# Patient Record
Sex: Female | Born: 1950 | Race: Black or African American | Hispanic: No | Marital: Single | Smoking: Never smoker
Health system: Southern US, Community
[De-identification: ages and names within clinical notes are randomized; demographics above are authoritative.]

## PROBLEM LIST (undated history)

## (undated) DIAGNOSIS — J189 Pneumonia, unspecified organism: Secondary | ICD-10-CM

## (undated) DIAGNOSIS — D649 Anemia, unspecified: Secondary | ICD-10-CM

## (undated) DIAGNOSIS — G5602 Carpal tunnel syndrome, left upper limb: Secondary | ICD-10-CM

## (undated) DIAGNOSIS — Z8719 Personal history of other diseases of the digestive system: Secondary | ICD-10-CM

## (undated) DIAGNOSIS — E119 Type 2 diabetes mellitus without complications: Secondary | ICD-10-CM

## (undated) DIAGNOSIS — N159 Renal tubulo-interstitial disease, unspecified: Secondary | ICD-10-CM

## (undated) DIAGNOSIS — Z972 Presence of dental prosthetic device (complete) (partial): Secondary | ICD-10-CM

## (undated) DIAGNOSIS — M5136 Other intervertebral disc degeneration, lumbar region: Secondary | ICD-10-CM

## (undated) DIAGNOSIS — E7800 Pure hypercholesterolemia, unspecified: Secondary | ICD-10-CM

## (undated) DIAGNOSIS — M1990 Unspecified osteoarthritis, unspecified site: Secondary | ICD-10-CM

## (undated) DIAGNOSIS — K219 Gastro-esophageal reflux disease without esophagitis: Secondary | ICD-10-CM

## (undated) DIAGNOSIS — I10 Essential (primary) hypertension: Secondary | ICD-10-CM

## (undated) DIAGNOSIS — F0390 Unspecified dementia without behavioral disturbance: Secondary | ICD-10-CM

## (undated) DIAGNOSIS — M51369 Other intervertebral disc degeneration, lumbar region without mention of lumbar back pain or lower extremity pain: Secondary | ICD-10-CM

## (undated) DIAGNOSIS — J45909 Unspecified asthma, uncomplicated: Secondary | ICD-10-CM

## (undated) HISTORY — PX: CARPAL TUNNEL RELEASE: SHX101

## (undated) HISTORY — PX: ABDOMINAL HYSTERECTOMY: SHX81

## (undated) HISTORY — PX: APPENDECTOMY: SHX54

## (undated) HISTORY — PX: EYE SURGERY: SHX253

## (undated) HISTORY — PX: COLONOSCOPY: SHX174

## (undated) HISTORY — PX: REVERSE TOTAL SHOULDER ARTHROPLASTY: SHX2344

## (undated) HISTORY — PX: ESOPHAGOGASTRODUODENOSCOPY: SHX1529

## (undated) HISTORY — PX: BACK SURGERY: SHX140

## (undated) HISTORY — PX: LAMINECTOMY: SHX219

## (undated) HISTORY — PX: WRIST FUSION: SHX839

---

## 2006-09-23 ENCOUNTER — Ambulatory Visit: Payer: Self-pay | Admitting: Otolaryngology

## 2006-11-22 ENCOUNTER — Ambulatory Visit: Payer: Self-pay | Admitting: Unknown Physician Specialty

## 2010-08-06 ENCOUNTER — Emergency Department: Payer: Self-pay | Admitting: Unknown Physician Specialty

## 2011-08-03 ENCOUNTER — Ambulatory Visit: Payer: Self-pay

## 2012-04-28 ENCOUNTER — Ambulatory Visit: Payer: Self-pay | Admitting: Internal Medicine

## 2013-05-25 ENCOUNTER — Ambulatory Visit: Payer: Self-pay | Admitting: Internal Medicine

## 2014-05-28 ENCOUNTER — Ambulatory Visit: Payer: Self-pay | Admitting: Internal Medicine

## 2015-06-13 ENCOUNTER — Other Ambulatory Visit: Payer: Self-pay | Admitting: Internal Medicine

## 2015-06-13 DIAGNOSIS — Z1231 Encounter for screening mammogram for malignant neoplasm of breast: Secondary | ICD-10-CM

## 2015-06-20 ENCOUNTER — Ambulatory Visit
Admission: RE | Admit: 2015-06-20 | Discharge: 2015-06-20 | Disposition: A | Discharge status: Home / Self Care | Payer: Medicare Other | Source: Ambulatory Visit | Attending: Internal Medicine | Admitting: Internal Medicine

## 2015-06-20 ENCOUNTER — Other Ambulatory Visit: Payer: Self-pay | Admitting: Internal Medicine

## 2015-06-20 DIAGNOSIS — Z1231 Encounter for screening mammogram for malignant neoplasm of breast: Secondary | ICD-10-CM

## 2016-06-05 ENCOUNTER — Other Ambulatory Visit: Payer: Self-pay | Admitting: Internal Medicine

## 2016-06-05 DIAGNOSIS — Z1231 Encounter for screening mammogram for malignant neoplasm of breast: Secondary | ICD-10-CM

## 2016-07-02 ENCOUNTER — Ambulatory Visit
Admission: RE | Admit: 2016-07-02 | Discharge: 2016-07-02 | Disposition: A | Discharge status: Home / Self Care | Payer: Medicare Other | Source: Ambulatory Visit | Attending: Internal Medicine | Admitting: Internal Medicine

## 2016-07-02 DIAGNOSIS — Z1231 Encounter for screening mammogram for malignant neoplasm of breast: Secondary | ICD-10-CM | POA: Diagnosis present

## 2016-07-05 ENCOUNTER — Other Ambulatory Visit: Payer: Self-pay | Admitting: Internal Medicine

## 2016-07-05 DIAGNOSIS — R928 Other abnormal and inconclusive findings on diagnostic imaging of breast: Secondary | ICD-10-CM

## 2016-07-10 ENCOUNTER — Other Ambulatory Visit: Payer: Self-pay | Admitting: General Surgery

## 2016-07-19 ENCOUNTER — Ambulatory Visit
Admission: RE | Admit: 2016-07-19 | Discharge: 2016-07-19 | Disposition: A | Discharge status: Home / Self Care | Payer: Medicare Other | Source: Ambulatory Visit | Attending: Internal Medicine | Admitting: Internal Medicine

## 2016-07-19 DIAGNOSIS — R928 Other abnormal and inconclusive findings on diagnostic imaging of breast: Secondary | ICD-10-CM | POA: Diagnosis present

## 2016-11-15 ENCOUNTER — Ambulatory Visit
Admission: RE | Admit: 2016-11-15 | Discharge: 2016-11-15 | Disposition: A | Discharge status: Home / Self Care | Payer: Medicare Other | Source: Ambulatory Visit | Attending: Internal Medicine | Admitting: Internal Medicine

## 2016-11-15 ENCOUNTER — Other Ambulatory Visit: Payer: Self-pay | Admitting: Internal Medicine

## 2016-11-15 DIAGNOSIS — M545 Low back pain: Secondary | ICD-10-CM | POA: Insufficient documentation

## 2016-11-15 DIAGNOSIS — M4186 Other forms of scoliosis, lumbar region: Secondary | ICD-10-CM | POA: Diagnosis not present

## 2016-11-15 DIAGNOSIS — M5136 Other intervertebral disc degeneration, lumbar region: Secondary | ICD-10-CM | POA: Diagnosis present

## 2016-11-15 DIAGNOSIS — Z8739 Personal history of other diseases of the musculoskeletal system and connective tissue: Secondary | ICD-10-CM

## 2016-11-15 DIAGNOSIS — M4316 Spondylolisthesis, lumbar region: Secondary | ICD-10-CM | POA: Insufficient documentation

## 2016-11-15 DIAGNOSIS — Z9889 Other specified postprocedural states: Secondary | ICD-10-CM | POA: Insufficient documentation

## 2017-05-22 ENCOUNTER — Encounter: Admission: RE | Payer: Self-pay | Source: Ambulatory Visit

## 2017-05-22 ENCOUNTER — Ambulatory Visit
Admission: RE | Admit: 2017-05-22 | Payer: Medicare Other | Source: Ambulatory Visit | Admitting: Unknown Physician Specialty

## 2017-05-22 SURGERY — ESOPHAGOGASTRODUODENOSCOPY (EGD) WITH PROPOFOL
Anesthesia: General

## 2017-06-28 ENCOUNTER — Other Ambulatory Visit: Payer: Self-pay | Admitting: Internal Medicine

## 2017-06-28 DIAGNOSIS — Z1231 Encounter for screening mammogram for malignant neoplasm of breast: Secondary | ICD-10-CM

## 2017-07-19 ENCOUNTER — Ambulatory Visit
Admission: RE | Admit: 2017-07-19 | Discharge: 2017-07-19 | Disposition: A | Discharge status: Home / Self Care | Payer: Medicare Other | Source: Ambulatory Visit | Attending: Internal Medicine | Admitting: Internal Medicine

## 2017-07-19 DIAGNOSIS — Z1231 Encounter for screening mammogram for malignant neoplasm of breast: Secondary | ICD-10-CM

## 2017-08-16 ENCOUNTER — Encounter: Payer: Self-pay | Admitting: *Deleted

## 2017-08-19 ENCOUNTER — Ambulatory Visit: Payer: Medicare Other | Admitting: Anesthesiology

## 2017-08-19 ENCOUNTER — Ambulatory Visit
Admission: RE | Admit: 2017-08-19 | Discharge: 2017-08-19 | Disposition: A | Discharge status: Home / Self Care | Payer: Medicare Other | Source: Ambulatory Visit | Attending: Unknown Physician Specialty | Admitting: Unknown Physician Specialty

## 2017-08-19 ENCOUNTER — Encounter
Admission: RE | Disposition: A | Discharge status: Home / Self Care | Payer: Self-pay | Source: Ambulatory Visit | Attending: Unknown Physician Specialty

## 2017-08-19 DIAGNOSIS — K648 Other hemorrhoids: Secondary | ICD-10-CM | POA: Diagnosis not present

## 2017-08-19 DIAGNOSIS — K5730 Diverticulosis of large intestine without perforation or abscess without bleeding: Secondary | ICD-10-CM | POA: Insufficient documentation

## 2017-08-19 DIAGNOSIS — Z7982 Long term (current) use of aspirin: Secondary | ICD-10-CM | POA: Diagnosis not present

## 2017-08-19 DIAGNOSIS — Z1211 Encounter for screening for malignant neoplasm of colon: Secondary | ICD-10-CM | POA: Diagnosis present

## 2017-08-19 DIAGNOSIS — Z7984 Long term (current) use of oral hypoglycemic drugs: Secondary | ICD-10-CM | POA: Insufficient documentation

## 2017-08-19 DIAGNOSIS — J45909 Unspecified asthma, uncomplicated: Secondary | ICD-10-CM | POA: Insufficient documentation

## 2017-08-19 DIAGNOSIS — Z79899 Other long term (current) drug therapy: Secondary | ICD-10-CM | POA: Diagnosis not present

## 2017-08-19 DIAGNOSIS — I10 Essential (primary) hypertension: Secondary | ICD-10-CM | POA: Diagnosis not present

## 2017-08-19 DIAGNOSIS — K222 Esophageal obstruction: Secondary | ICD-10-CM | POA: Insufficient documentation

## 2017-08-19 HISTORY — DX: Unspecified asthma, uncomplicated: J45.909

## 2017-08-19 HISTORY — PX: COLONOSCOPY WITH PROPOFOL: SHX5780

## 2017-08-19 HISTORY — PX: ESOPHAGOGASTRODUODENOSCOPY (EGD) WITH PROPOFOL: SHX5813

## 2017-08-19 HISTORY — DX: Unspecified osteoarthritis, unspecified site: M19.90

## 2017-08-19 HISTORY — DX: Essential (primary) hypertension: I10

## 2017-08-19 LAB — GLUCOSE, CAPILLARY: GLUCOSE-CAPILLARY: 97 mg/dL (ref 65–99)

## 2017-08-19 SURGERY — COLONOSCOPY WITH PROPOFOL
Anesthesia: General

## 2017-08-19 MED ORDER — SODIUM CHLORIDE 0.9 % IV SOLN: INTRAVENOUS | Status: DC

## 2017-08-19 MED ORDER — FENTANYL CITRATE (PF) 100 MCG/2ML IJ SOLN
INTRAMUSCULAR | Status: DC | PRN
  Administered 2017-08-19 (×2): 50 ug via INTRAVENOUS

## 2017-08-19 MED ORDER — SODIUM CHLORIDE 0.9 % IV SOLN
INTRAVENOUS | Status: DC
  Administered 2017-08-19: 13:00:00 via INTRAVENOUS

## 2017-08-19 MED ORDER — GLYCOPYRROLATE 0.2 MG/ML IJ SOLN
INTRAMUSCULAR | Status: AC
  Filled 2017-08-19: qty 1

## 2017-08-19 MED ORDER — PROPOFOL 10 MG/ML IV BOLUS
INTRAVENOUS | Status: DC | PRN
  Administered 2017-08-19: 20 mg via INTRAVENOUS
  Administered 2017-08-19: 30 mg via INTRAVENOUS

## 2017-08-19 MED ORDER — PROPOFOL 500 MG/50ML IV EMUL
INTRAVENOUS | Status: DC | PRN
  Administered 2017-08-19: 50 ug/kg/min via INTRAVENOUS

## 2017-08-19 MED ORDER — MIDAZOLAM HCL 2 MG/2ML IJ SOLN
INTRAMUSCULAR | Status: AC
  Filled 2017-08-19: qty 2

## 2017-08-19 MED ORDER — LIDOCAINE HCL (PF) 2 % IJ SOLN
INTRAMUSCULAR | Status: DC | PRN
  Administered 2017-08-19: 60 mg

## 2017-08-19 MED ORDER — FENTANYL CITRATE (PF) 100 MCG/2ML IJ SOLN
INTRAMUSCULAR | Status: AC
  Filled 2017-08-19: qty 2

## 2017-08-19 MED ORDER — LIDOCAINE HCL (PF) 2 % IJ SOLN
INTRAMUSCULAR | Status: AC
  Filled 2017-08-19: qty 10

## 2017-08-19 MED ORDER — MIDAZOLAM HCL 5 MG/5ML IJ SOLN
INTRAMUSCULAR | Status: DC | PRN
  Administered 2017-08-19: 2 mg via INTRAVENOUS

## 2017-08-19 MED ORDER — PROPOFOL 500 MG/50ML IV EMUL
INTRAVENOUS | Status: AC
  Filled 2017-08-19: qty 50

## 2017-08-19 NOTE — Op Note (Signed)
[facility] Gastroenterology Patient Name: Kristie Hoover Procedure Date: 08/19/2017 8:10 AM MRN: [id number] Account #: [id number] Date of Birth: 02-17-1951 Admit Type: Outpatient Age: 67 Room: [facility] ENDO ROOM 3 Gender: Female Note Status: Finalized Procedure:            Upper GI endoscopy Indications:          Dysphagia, Heartburn Providers:            Scot Jun, MD Referring MD:         Jillene Bucks. Arlana Pouch, MD (Referring MD) Medicines:            Propofol per Anesthesia Complications:        No immediate complications. Procedure:            Pre-Anesthesia Assessment:                       - After reviewing the risks and benefits, the patient                        was deemed in satisfactory condition to undergo the                        procedure.                       After obtaining informed consent, the endoscope was                        passed under direct vision. Throughout the procedure,                        the patient's blood pressure, pulse, and oxygen                        saturations were monitored continuously. The Endoscope                        was introduced through the mouth, and advanced to the                        second part of duodenum. The upper GI endoscopy was                        accomplished without difficulty. The patient tolerated                        the procedure well. Findings:      A mild Schatzki ring was found at the gastroesophageal junction. A       guidewire was placed and the scope was withdrawn. Dilation was performed       with a Savary dilator with mild resistance at 17 mm.      The stomach was normal.      The examined duodenum was normal. Impression:           - Mild Schatzki ring. Dilated.                       - Normal stomach.                       - Normal examined duodenum.                       -  No specimens collected. Recommendation:       - soft food for 3 days, eat slowly, chew well, take                   small bites Scot Junobert T Adraine Biffle, MD 08/19/2017 1:43:21 PM This report has been signed electronically. Number of Addenda: 0 Note Initiated On: 08/19/2017 8:10 AM      [facility]

## 2017-08-19 NOTE — Anesthesia Post-op Follow-up Note (Signed)
Anesthesia QCDR form completed.        

## 2017-08-19 NOTE — Op Note (Signed)
[facility] Gastroenterology Patient Name: Kristie SallesBrenda Hoover Procedure Date: 08/19/2017 8:10 AM MRN: [id number] Account #: [id number] Date of Birth: April 29, 1950 Admit Type: Outpatient Age: [age] Room: [facility] ENDO ROOM 3 Gender: Female Note Status: Finalized Procedure:            Colonoscopy Indications:          Screening for colorectal malignant neoplasm Providers:            Scot Junobert T. Breckon Reeves, MD Referring MD:         Jillene Bucksenny C. Arlana Pouchate, MD (Referring MD) Medicines:            Propofol per Anesthesia Complications:        No immediate complications. Procedure:            Pre-Anesthesia Assessment:                       - After reviewing the risks and benefits, the patient                        was deemed in satisfactory condition to undergo the                        procedure.                       After obtaining informed consent, the colonoscope was                        passed under direct vision. Throughout the procedure,                        the patient's blood pressure, pulse, and oxygen                        saturations were monitored continuously. The                        Colonoscope was introduced through the anus and                        advanced to the the cecum, identified by appendiceal                        orifice and ileocecal valve. The colonoscopy was                        performed without difficulty. The patient tolerated the                        procedure well. The quality of the bowel preparation                        was good. Findings:      Multiple medium-mouthed diverticula were found in the sigmoid colon,       descending colon, transverse colon and ascending colon.      Internal hemorrhoids were found during endoscopy. The hemorrhoids were       small and Grade I (internal hemorrhoids that do not prolapse).      The exam was otherwise without abnormality. Impression:           - Diverticulosis  in the sigmoid colon, in the              descending colon, in the transverse colon and in the                        ascending colon.                       - Internal hemorrhoids.                       - The examination was otherwise normal.                       - No specimens collected. Recommendation:       - Repeat colonoscopy in 10 years for screening purposes. Scot Jun, MD 08/19/2017 2:02:21 PM This report has been signed electronically. Number of Addenda: 0 Note Initiated On: 08/19/2017 8:10 AM Scope Withdrawal Time: 0 hours 5 minutes 38 seconds  Total Procedure Duration: 0 hours 12 minutes 41 seconds       [facility]

## 2017-08-19 NOTE — Anesthesia Preprocedure Evaluation (Signed)
Anesthesia Evaluation  Patient identified by MRN, date of birth, ID band Patient awake    Reviewed: Allergy & Precautions, H&P , NPO status , Patient's Chart, lab work & pertinent test results, reviewed documented beta blocker date and time   Airway Mallampati: II   Neck ROM: full    Dental  (+) Teeth Intact   Pulmonary neg pulmonary ROS, asthma ,    Pulmonary exam normal        Cardiovascular Exercise Tolerance: Good hypertension, On Medications negative cardio ROS Normal cardiovascular exam Rhythm:regular Rate:Normal     Neuro/Psych negative neurological ROS  negative psych ROS   GI/Hepatic negative GI ROS, Neg liver ROS,   Endo/Other  negative endocrine ROS  Renal/GU negative Renal ROS  negative genitourinary   Musculoskeletal   Abdominal   Peds  Hematology negative hematology ROS (+)   Anesthesia Other Findings Past Medical History: No date: Arthritis No date: Asthma No date: Hypertension Past Surgical History: No date: ABDOMINAL HYSTERECTOMY No date: APPENDECTOMY No date: COLONOSCOPY No date: ESOPHAGOGASTRODUODENOSCOPY No date: LAMINECTOMY No date: WRIST FUSION BMI    Body Mass Index:  29.95 kg/m     Reproductive/Obstetrics negative OB ROS                             Anesthesia Physical Anesthesia Plan  ASA: III  Anesthesia Plan: General   Post-op Pain Management:    Induction:   PONV Risk Score and Plan:   Airway Management Planned:   Additional Equipment:   Intra-op Plan:   Post-operative Plan:   Informed Consent: I have reviewed the patients History and Physical, chart, labs and discussed the procedure including the risks, benefits and alternatives for the proposed anesthesia with the patient or authorized representative who has indicated his/her understanding and acceptance.   Dental Advisory Given  Plan Discussed with: CRNA  Anesthesia Plan  Comments:         Anesthesia Quick Evaluation

## 2017-08-19 NOTE — H&P (Signed)
Primary Care Physician:  Jaclyn Shaggy, MD Primary Gastroenterologist:  Dr. Mechele Collin  Pre-Procedure History & Physical: HPI:  Kristie Hoover is a 67 y.o. female is here for an colonoscopy.  For colon cancer screening.  EGD for dysphagia.   Past Medical History:  Diagnosis Date  . Arthritis   . Asthma   . Hypertension     Past Surgical History:  Procedure Laterality Date  . ABDOMINAL HYSTERECTOMY    . APPENDECTOMY    . COLONOSCOPY    . ESOPHAGOGASTRODUODENOSCOPY    . LAMINECTOMY    . WRIST FUSION      Prior to Admission medications   Medication Sig Start Date End Date Taking? Authorizing Provider  aspirin EC 81 MG tablet Take 81 mg by mouth daily.   Yes [provider]  cyclobenzaprine (FLEXERIL) 10 MG tablet Take 10 mg by mouth 3 (three) times daily as needed for muscle spasms.   Yes [provider]  etodolac (LODINE) 400 MG tablet Take 400 mg by mouth 2 (two) times daily.   Yes [provider]  Fluticasone-Salmeterol (ADVAIR) 100-50 MCG/DOSE AEPB Inhale 1 puff into the lungs 2 (two) times daily.   Yes [provider]  glipizide-metformin (METAGLIP) 2.5-250 MG tablet Take 1 tablet by mouth 2 (two) times daily before a meal.   Yes [provider]  montelukast (SINGULAIR) 10 MG tablet Take 10 mg by mouth at bedtime.   Yes [provider]  pantoprazole (PROTONIX) 40 MG tablet Take 40 mg by mouth daily.   Yes [provider]  potassium chloride SA (K-DUR,KLOR-CON) 20 MEQ tablet Take 20 mEq by mouth 2 (two) times daily.   Yes [provider]  pravastatin (PRAVACHOL) 40 MG tablet Take 40 mg by mouth daily.   Yes [provider]    Allergies as of 06/04/2017 - never reviewed  Allergen Reaction Noted  . Erythromycin Rash 07/02/2016    Family History  Problem Relation Age of Onset  . Breast cancer Other     Social History   Socioeconomic History  . Marital status: Single    Spouse name: Not on  file  . Number of children: Not on file  . Years of education: Not on file  . Highest education level: Not on file  Occupational History  . Not on file  Social Needs  . Financial resource strain: Not on file  . Food insecurity:    Worry: Not on file    Inability: Not on file  . Transportation needs:    Medical: Not on file    Non-medical: Not on file  Tobacco Use  . Smoking status: Not on file  Substance and Sexual Activity  . Alcohol use: Not on file  . Drug use: Not on file  . Sexual activity: Not on file  Lifestyle  . Physical activity:    Days per week: Not on file    Minutes per session: Not on file  . Stress: Not on file  Relationships  . Social connections:    Talks on phone: Not on file    Gets together: Not on file    Attends religious service: Not on file    Active member of club or organization: Not on file    Attends meetings of clubs or organizations: Not on file    Relationship status: Not on file  . Intimate partner violence:    Fear of current or ex partner: Not on file  Emotionally abused: Not on file    Physically abused: Not on file    Forced sexual activity: Not on file  Other Topics Concern  . Not on file  Social History Narrative  . Not on file    Review of Systems: See HPI, otherwise negative ROS  Physical Exam: BP (!) 172/78   Temp (!) 97.5 F (36.4 C) (Tympanic)   Ht 5\' 8"  (1.727 m)   Wt 89.4 kg (197 lb)   SpO2 98%   BMI 29.95 kg/m  General:   Alert,  pleasant and cooperative in NAD Head:  Normocephalic and atraumatic. Neck:  Supple; no masses or thyromegaly. Lungs:  Clear throughout to auscultation.    Heart:  Regular rate and rhythm. Abdomen:  Soft, nontender and nondistended. Normal bowel sounds, without guarding, and without rebound.   Neurologic:  Alert and  oriented x4;  grossly normal neurologically.  Impression/Plan: Kristie Hoover is here for an colonoscopy to be performed for colon cancer screening., EGD for  dysphagia.  Risks, benefits, limitations, and alternatives regarding  endoscopy and colonoscopy have been reviewed with the patient.  Questions have been answered.  All parties agreeable.   Lynnae PrudeELLIOTT, Charlyne Robertshaw, MD  08/19/2017, 1:18 PM

## 2017-08-19 NOTE — Transfer of Care (Signed)
Immediate Anesthesia Transfer of Care Note  Patient: Kristie Hoover  Procedure(s) Performed: COLONOSCOPY WITH PROPOFOL (N/A ) ESOPHAGOGASTRODUODENOSCOPY (EGD) WITH PROPOFOL (N/A )  Patient Location: PACU  Anesthesia Type:General  Level of Consciousness: sedated  Airway & Oxygen Therapy: Patient Spontanous Breathing and Patient connected to nasal cannula oxygen  Post-op Assessment: Report given to RN and Post -op Vital signs reviewed and stable  Post vital signs: Reviewed and stable  Last Vitals:  Vitals Value Taken Time  BP    Temp    Pulse 66 08/19/2017  2:02 PM  Resp 16 08/19/2017  2:02 PM  SpO2 96 % 08/19/2017  2:02 PM  Vitals shown include unvalidated device data.  Last Pain:  Vitals:   08/19/17 1251  TempSrc: Tympanic  PainSc: 0-No pain         Complications: No apparent anesthesia complications

## 2017-08-22 ENCOUNTER — Encounter: Payer: Self-pay | Admitting: Unknown Physician Specialty

## 2017-08-26 NOTE — Anesthesia Postprocedure Evaluation (Signed)
Anesthesia Post Note  Patient: Kristie Hoover  Procedure(s) Performed: COLONOSCOPY WITH PROPOFOL (N/A ) ESOPHAGOGASTRODUODENOSCOPY (EGD) WITH PROPOFOL (N/A )  Patient location during evaluation: PACU Anesthesia Type: General Level of consciousness: awake and alert Pain management: pain level controlled Vital Signs Assessment: post-procedure vital signs reviewed and stable Respiratory status: spontaneous breathing, nonlabored ventilation, respiratory function stable and patient connected to nasal cannula oxygen Cardiovascular status: blood pressure returned to baseline and stable Postop Assessment: no apparent nausea or vomiting Anesthetic complications: no     Last Vitals:  Vitals:   08/19/17 1422 08/19/17 1432  BP: 136/81 (!) 164/87  Pulse: 63 71  Resp: (!) 27 (!) 23  Temp:    SpO2: 100% 98%    Last Pain:  Vitals:   08/20/17 0717  TempSrc:   PainSc: 0-No pain                 Yevette EdwardsJames G Adams

## 2018-05-19 IMAGING — MG MM DIGITAL DIAGNOSTIC UNILAT*L* W/ TOMO W/ CAD
6 series · 6 of 14 positions shown · non-contrast
Comparison: Previous exam(s).

CLINICAL DATA: Screening recall for a left breast distortion. Also,
the technologist noted some dimpling around the left nipple at the
time of the screening mammogram. The patient is asymptomatic.

EXAM:
2D DIGITAL DIAGNOSTIC UNILATERAL LEFT MAMMOGRAM WITH CAD AND ADJUNCT
TOMO
LEFT BREAST ULTRASOUND

[L MLO synth-2D]
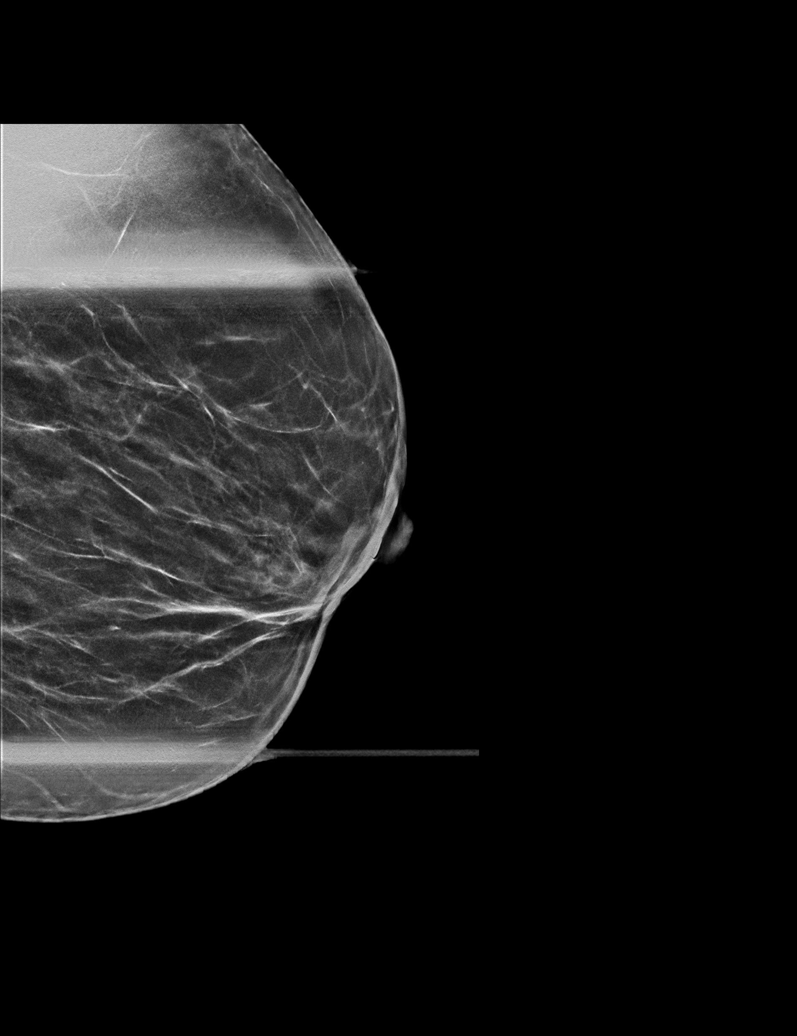

[L MLO]
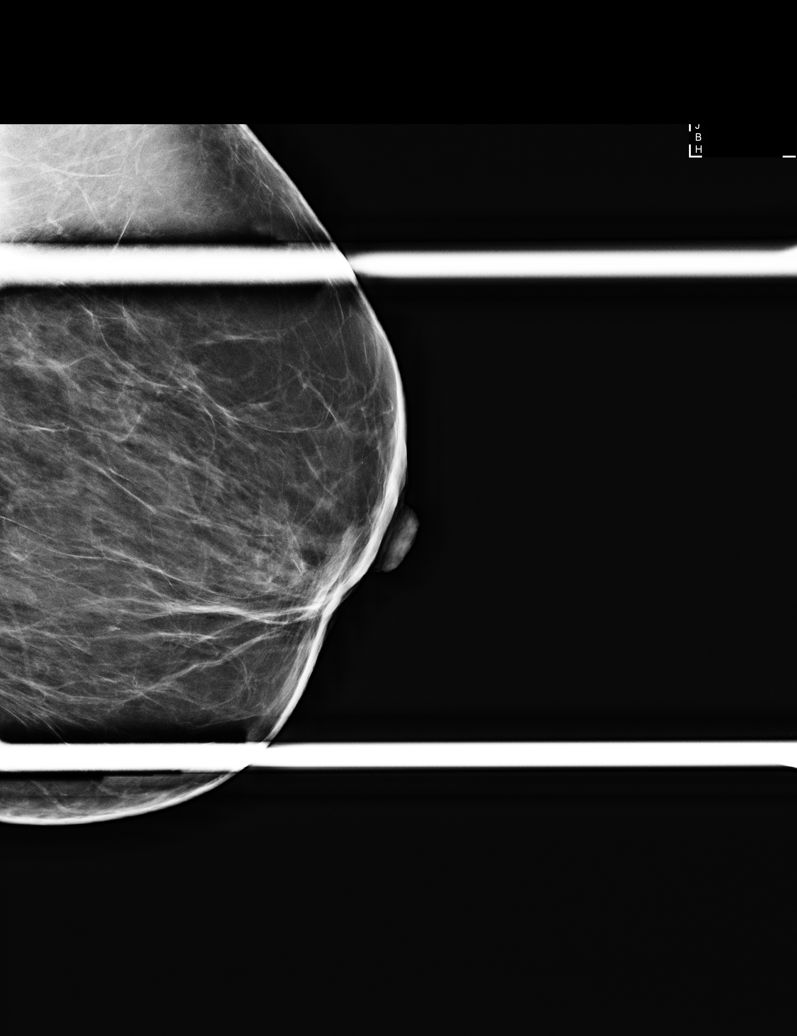

[L ML synth-2D]
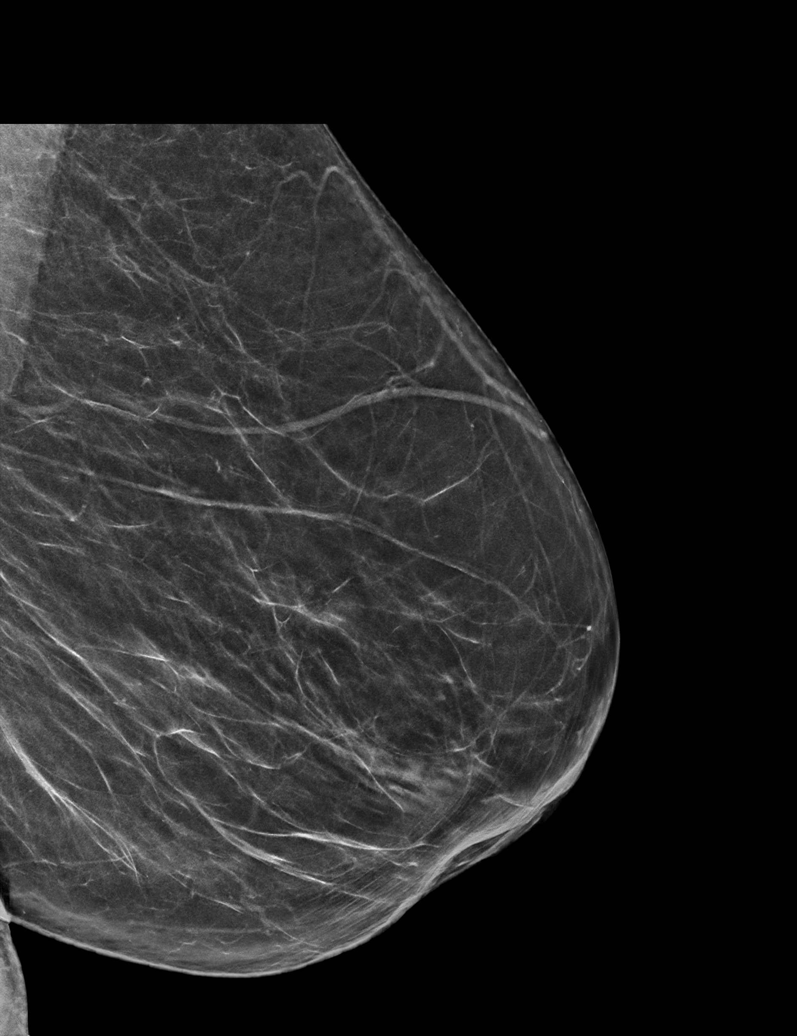

[L ML]
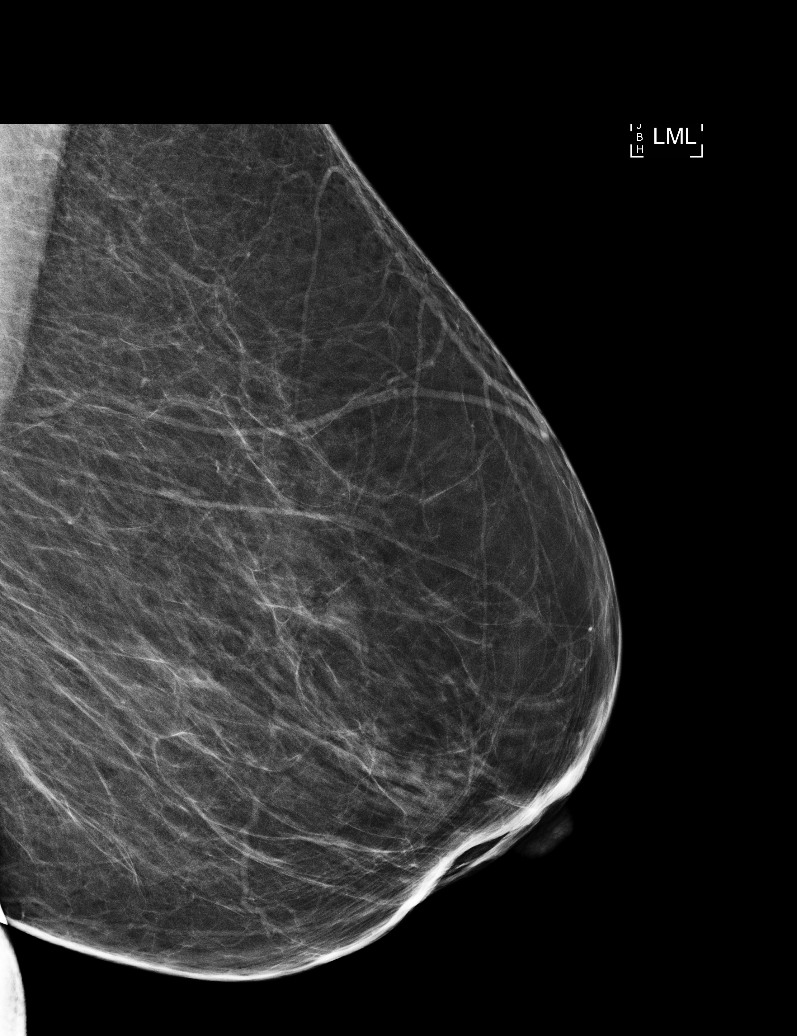

[L MLO tomo · tomo slice 23/44.0]
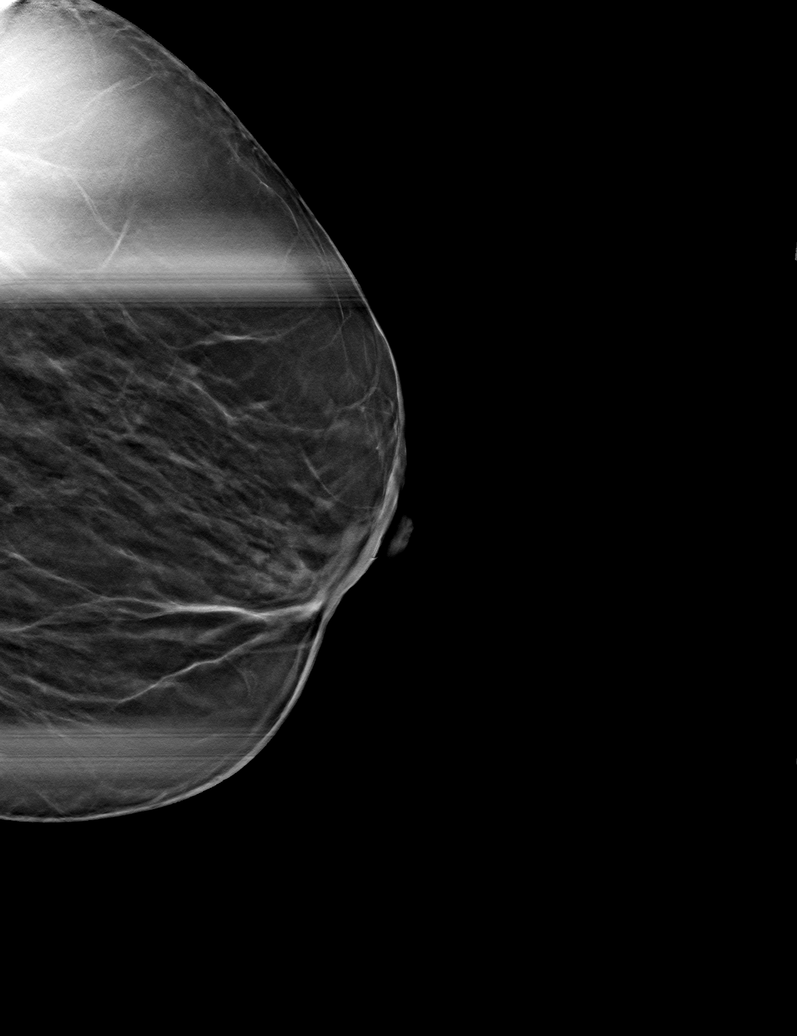

[L ML tomo · tomo slice 31/62.0]
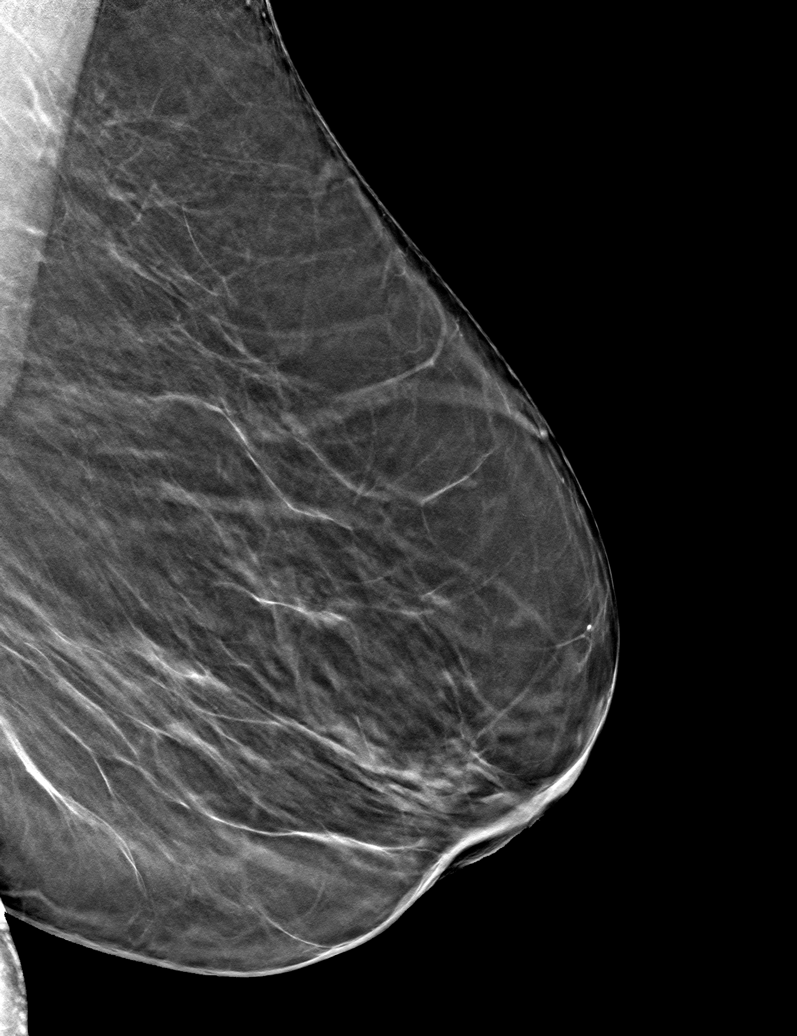

[6 of 14 positions shown; findings below may reference images not displayed]

ACR Breast Density Category b: There are scattered areas of
fibroglandular density.
FINDINGS: No suspicious calcifications, masses or areas of distortion are seen
in the bilateral breasts. There is a mild indentation inferior to
the left nipple seen on the true lateral and MLO views, however this
does not appear significantly different from several prior exams.
This is also similar to the contralateral breast. No distortion is
identified.

Mammographic images were processed with CAD.

Physical exam of the subareolar left breast demonstrates a slight
indentation of the breast inferior to the left nipple within the
areola, however only when compressed. No palpable masses are
identified on exam.

Ultrasound of the subareolar left breast demonstrates normal
fibroglandular tissue. No masses or suspicious areas of shadowing
are identified.
IMPRESSION: 1. No mammographic or targeted sonographic abnormalities in the
subareolar left breast.

2. No mammographic or targeted sonographic evidence of left breast
malignancy.

RECOMMENDATION:
1. Clinical correlation is recommended for the dimpling of the left
breast. If there is any concern for a radiographically or
sonographically occult abnormality contributing to this indentation,
breast MRI is recommended.

2.  Screening mammogram in one year.(Code:[id number]-[id number])

I have discussed the findings and recommendations with the patient.
Results were also provided in writing at the conclusion of the
visit. If applicable, a reminder letter will be sent to the patient
regarding the next appointment.

BI-RADS CATEGORY  1: Negative.

## 2018-12-08 ENCOUNTER — Other Ambulatory Visit: Payer: Self-pay | Admitting: Internal Medicine

## 2018-12-08 DIAGNOSIS — Z1231 Encounter for screening mammogram for malignant neoplasm of breast: Secondary | ICD-10-CM

## 2019-01-27 ENCOUNTER — Ambulatory Visit
Admission: RE | Admit: 2019-01-27 | Discharge: 2019-01-27 | Disposition: A | Discharge status: Home / Self Care | Payer: Medicare Other | Source: Ambulatory Visit | Attending: Internal Medicine | Admitting: Internal Medicine

## 2019-01-27 DIAGNOSIS — Z1231 Encounter for screening mammogram for malignant neoplasm of breast: Secondary | ICD-10-CM

## 2020-01-20 ENCOUNTER — Other Ambulatory Visit: Payer: Self-pay | Admitting: Internal Medicine

## 2020-01-20 DIAGNOSIS — Z1231 Encounter for screening mammogram for malignant neoplasm of breast: Secondary | ICD-10-CM

## 2020-02-15 ENCOUNTER — Ambulatory Visit (INDEPENDENT_AMBULATORY_CARE_PROVIDER_SITE_OTHER): Payer: Medicare Other | Admitting: Obstetrics & Gynecology

## 2020-02-15 ENCOUNTER — Encounter: Payer: Self-pay | Admitting: Obstetrics & Gynecology

## 2020-02-15 ENCOUNTER — Other Ambulatory Visit: Payer: Self-pay

## 2020-02-15 VITALS — BP 120/80 | Ht 68.0 in | Wt 191.0 lb

## 2020-02-15 DIAGNOSIS — N8111 Cystocele, midline: Secondary | ICD-10-CM | POA: Insufficient documentation

## 2020-02-15 NOTE — Patient Instructions (Addendum)
Thank you for choosing [facility]. As part of our ongoing efforts to improve patient experience, we would appreciate your feedback. Please fill out the short survey that you will receive by mail or MyChart. Your opinion is important to [place]! -Dr Tiburcio Pea   Anterior and Posterior Colporrhaphy  Anterior or posterior colporrhaphy is surgery to fix a prolapse of organs in the genital tract. Prolapse is a condition in which an organ bulges or drops down from its normal position. Organs that commonly prolapse include the rectum, bladder, vagina, and uterus. Prolapse can affect a single organ or several organs at the same time.  You may need this surgery if you have a severe prolapse that causes symptoms that interfere with your daily life and cannot be corrected with other treatments. Prolapse often worsens when women stop having their monthly periods (menopause) because estrogen loss weakens the muscles and tissues in the genital tract. Prolapse can also happen when the organs are damaged or weakened. This commonly happens after childbirth and as a result of aging. The type of colporrhaphy done depends on the type of genital prolapse. Types of genital prolapse include the following:  Cystocele. This is a prolapse of the bladder and the upper part of the front (anterior) wall of the vagina.  Rectocele. This is a prolapse of the rectum and the lower part of the back (posterior) wall of the vagina.  Enterocele. This is a prolapse of the small intestine. It appears as a bulge under the neck of the uterus at the top of the back wall of the vagina.  Procidentia. This is a complete prolapse of the uterus and the cervix. The prolapse can be seen and felt coming out of the vagina. Tell a health care provider about:  Any allergies you have.  All medicines you are taking, including vitamins, herbs, eye drops, creams, and over-the-counter medicines.  Any problems you or family members have had with anesthetic  medicines.  Any blood disorders you have.  Any surgeries you have had.  Any medical conditions you have.  Smoking history or history of alcohol use.  Whether you are pregnant or may be pregnant. What are the risks? Generally, this is a safe procedure. However, problems may occur, including:  Infection.  Bleeding.  Allergic reactions to medicines.  Damage to other structures or organs.  Problems urinating.  Incontinence.  Nerve damage.  Painful sex.  Constipation.  A blood clot that travels to your lungs. What happens before the procedure? Staying hydrated Follow instructions from your health care provider about hydration, which may include:  Up to 2 hours before the procedure - you may continue to drink clear liquids, such as water, clear fruit juice, black coffee, and plain tea. Eating and drinking restrictions Follow instructions from your health care provider about eating and drinking, which may include:  8 hours before the procedure - stop eating heavy meals or foods such as meat, fried foods, or fatty foods.  6 hours before the procedure - stop eating light meals or foods, such as toast or cereal.  6 hours before the procedure - stop drinking milk or drinks that contain milk.  2 hours before the procedure - stop drinking clear liquids. General instructions  Ask your health care provider about: ? Changing or stopping your regular medicines. This is especially important if you are taking diabetes medicines or blood thinners. ? Taking medicines such as aspirin and ibuprofen. These medicines can thin your blood. Do not take these medicines  before your procedure if your health care provider instructs you not to.  You may be given antibiotics to help prevent infection.  You may be instructed to use estrogen cream in your vagina to help prevent complications and promote healing.  Do not use any products that contain nicotine or tobacco, such as cigarettes and  e-cigarettes, for at least 2 weeks before the procedure. If you need help quitting, ask your health care provider.  Plan to have someone take you home from the hospital. Also, arrange for someone to help you with activities during recovery. What happens during the procedure?  To lower your risk of infection: ? Your health care team will wash or sanitize their hands. ? Your skin will be washed with soap. ? Hair may be removed from the surgical area.  An IV will be inserted into one of your veins.  You will be given one or more of the following: ? A medicine to help you relax (sedative). ? A medicine to make you fall asleep (general anesthetic).  You may be given antibiotics through your IV.  You will lie down on the operating table with your feet in stirrups.  A small, thin tube (catheter) will be inserted through your urethra into your bladder to drain urine during surgery and recovery.  An instrument (vaginal speculum) will be used to hold your vagina open.  Your health care provider will perform the procedure according to the type of repair you require: Anterior repair  An incision will be made in the midline section of the front part of the vaginal wall.  A triangular-shaped piece of vaginal tissue will be removed.  The stronger, healthier tissue will be sewn together in order to support the bladder.  These incisions may be closed with stitches (sutures).  Gauze packing will be placed inside your vagina. Posterior repair  An incision will be made midline on the back wall of the vagina.  A triangular portion of vaginal skin will be removed to expose the muscle.  Excess tissue will be removed, and stronger, healthier muscle and ligament tissue will be sewn together to support the rectum.  These incisions may be closed with stitches (sutures).  Gauze packing will be placed inside your vagina. Anterior and posterior repair  Both procedures will be done during the same  surgery. The procedure may vary among health care providers and hospitals. What happens after the procedure?  Your blood pressure, heart rate, breathing rate, and blood oxygen level will be monitored until the medicines you were given have worn off.  You will be given pain medicine as needed.  You will have a small tube in place to drain your bladder (urinary catheter). This will be in place until your bladder is working properly on its own.  You may have a gauze packing in your vagina for a few days to prevent bleeding.  You will start on a liquid diet and slowly move to a regular diet.  You will be encouraged to get up and walk as soon as you are able.  You may need to wear compression stockings. They help prevent blood clots and reduce swelling in your legs.  Do not drive for 24 hours if you were given a sedative. Summary  Anterior or posterior colporrhaphy is surgery to fix a prolapse of organs in the genital tract.  The type of repair done depends on the type of prolapse that is present.  Follow instructions from your health care provider about eating  and drinking before the procedure.  You will be given a general anesthetic to make you fall asleep during the procedure. This information is not intended to replace advice given to you by your health care provider. Make sure you discuss any questions you have with your health care provider. Document Revised: 02/15/2017 Document Reviewed: 04/12/2016 Elsevier Patient Education  2020 [employer].

## 2020-02-15 NOTE — Progress Notes (Signed)
Cystocele/Rectocele Patient is a 69 yo G0 AA F who complains of a vaginal pressure related to prolapse. Problem started several years ago but has noted symptom worsening this past 2 mos. Symptoms include: prolapse of tissue with straining and discomfort: moderate. Symptoms have gradually worsened.    Prior [facility] 1983.  Tried pessary 2013 with expulsion and no further tries.  Not sexually active.  Denies urge, freq, incontinence, bleeding.  PMHx: She  has a past medical history of Arthritis, Asthma, and Hypertension. Also,  has a past surgical history that includes Appendectomy; Colonoscopy; Esophagogastroduodenoscopy; Abdominal hysterectomy; Laminectomy; Wrist fusion; Colonoscopy with propofol (N/A, 08/19/2017); and Esophagogastroduodenoscopy (egd) with propofol (N/A, 08/19/2017)., family history includes Breast cancer in an other family member.,  reports that she has never smoked. She has never used smokeless tobacco. She reports that she does not drink alcohol and does not use drugs.  She has a current medication list which includes the following prescription(s): cyclobenzaprine, etodolac, fluticasone-salmeterol, furosemide, glipizide-metformin, montelukast, pantoprazole, potassium chloride sa, pravastatin, and aspirin ec. Also, is allergic to prednisone, tramadol, and erythromycin.  Review of Systems  Constitutional: Negative for chills, fever and malaise/fatigue.  HENT: Negative for congestion, sinus pain and sore throat.   Eyes: Negative for blurred vision and pain.  Respiratory: Positive for cough and wheezing.   Cardiovascular: Negative for chest pain and leg swelling.  Gastrointestinal: Negative for abdominal pain, constipation, diarrhea, heartburn, nausea and vomiting.  Genitourinary: Negative for dysuria, frequency, hematuria and urgency.  Musculoskeletal: Positive for joint pain. Negative for back pain, myalgias and neck pain.  Skin: Negative for itching and rash.  Neurological: Negative  for dizziness, tremors and weakness.  Endo/Heme/Allergies: Does not bruise/bleed easily.  Psychiatric/Behavioral: Negative for depression. The patient is not nervous/anxious and does not have insomnia.     Objective: BP 120/80   Ht 5\' 8"  (1.727 m)   Wt 191 lb (86.6 kg)   BMI 29.04 kg/m  Physical Exam Constitutional:      General: She is not in acute distress.    Appearance: She is well-developed.  Genitourinary:     Pelvic exam was performed with patient supine.     Vagina normal.     No vaginal erythema or bleeding.     Genitourinary Comments: Cuff intact/ no lesions Absent uterus and cervix Gr 4 Cystocele (outside introitus) Min rectocele Mild vag vault prolapse at apex  HENT:     Head: Normocephalic and atraumatic.     Nose: Nose normal.  Abdominal:     General: There is no distension.     Palpations: Abdomen is soft.     Tenderness: There is no abdominal tenderness.  Musculoskeletal:        General: Normal range of motion.  Neurological:     Mental Status: She is alert and oriented to person, place, and time.     Cranial Nerves: No cranial nerve deficit.  Skin:    General: Skin is warm and dry.  Psychiatric:        Attention and Perception: Attention normal.        Mood and Affect: Mood normal.        Speech: Speech normal.        Behavior: Behavior normal.        Cognition and Memory: Cognition normal.        Judgment: Judgment normal.     ASSESSMENT/PLAN:    Problem List Items Addressed This Visit      Genitourinary   Cystocele, midline -  Primary    Options discussed Pessary pros and cons, maintenance Surgery pros and cons, recovery Pt prefers Anterior colporrhaphy    Risks of surgery discussed.  Bladder dysfunction immediately after surgery as a possibility discussed.  Pt declines pessary option; prior failure of this modality  Annamarie Major, MD, [facility], [facility] 02/15/2020  1:26 PM

## 2020-03-01 ENCOUNTER — Other Ambulatory Visit: Payer: Self-pay

## 2020-03-01 ENCOUNTER — Ambulatory Visit
Admission: RE | Admit: 2020-03-01 | Discharge: 2020-03-01 | Disposition: A | Discharge status: Home / Self Care | Payer: Medicare Other | Source: Ambulatory Visit | Attending: Internal Medicine | Admitting: Internal Medicine

## 2020-03-01 DIAGNOSIS — Z1231 Encounter for screening mammogram for malignant neoplasm of breast: Secondary | ICD-10-CM | POA: Insufficient documentation

## 2020-03-09 ENCOUNTER — Telehealth: Payer: Self-pay | Admitting: Obstetrics & Gynecology

## 2020-03-09 NOTE — Telephone Encounter (Signed)
03/08/20 - Called pt several times - phone was busy  03/09/20 - Called pt - no answer, no voice mail - just rang

## 2020-03-10 ENCOUNTER — Telehealth: Payer: Self-pay | Admitting: Obstetrics & Gynecology

## 2020-03-10 NOTE — Telephone Encounter (Signed)
-----   Message from Nadara Mustard, MD sent at 02/15/2020  1:37 PM EST ----- Regarding: Surgery Surgery Booking Request Patient Full Name:  Kristie Hoover  MRN: [id number]  DOB: 10-19-1950  Surgeon: Letitia Libra, MD  Requested Surgery Date and Time: Any Primary Diagnosis AND Code: Cystocele  N81.11 Secondary Diagnosis and Code:  Surgical Procedure: Anterior colporrhaphy RNFA Requested?: No L&D Notification: No Admission Status: observation (needs overnight stay as has no one to stay with her the night after surgery) Length of Surgery: 50 min Special Case Needs: No H&P: Yes Phone Interview???:  Yes Interpreter: No Medical Clearance:  YES DR TATE Special Scheduling Instructions: No Any known health/anesthesia issues, diabetes, sleep apnea, latex allergy, defibrillator/pacemaker?: No Acuity: P3   (P1 highest, P2 delay may cause harm, P3 low, elective gyn, P4 lowest)

## 2020-03-10 NOTE — Telephone Encounter (Signed)
Called patient to schedule Anterior colporrhaphy w Tiburcio Pea  DOS 03/31/20  H&P 03/23/20 @ 11:20   Covid testing 1/11 @ 8-10:30, [address], drive up and wear mask. Advised pt to quarantine until DOS.  Pre-admit phone call appointment to be requested - date and time will be included on H&P paper work. Also all appointments will be updated on pt MyChart. Explained that this appointment has a call window. Based on the time scheduled will indicate if the call will be received within a 4 hour window before 1:00 or after.  Advised that pt may also receive calls from the hospital pharmacy and pre-service center.  Confirmed pt has [employer] Medicare as primary insurance. No secondary insurance.  Requesting medical clearance from Dewaine Oats, MD

## 2020-03-23 ENCOUNTER — Other Ambulatory Visit: Payer: Self-pay

## 2020-03-23 ENCOUNTER — Encounter: Payer: Self-pay | Admitting: Obstetrics & Gynecology

## 2020-03-23 ENCOUNTER — Ambulatory Visit (INDEPENDENT_AMBULATORY_CARE_PROVIDER_SITE_OTHER): Payer: Medicare Other | Admitting: Obstetrics & Gynecology

## 2020-03-23 VITALS — BP 126/82 | HR 52 | Ht 68.0 in | Wt 190.0 lb

## 2020-03-23 DIAGNOSIS — N8111 Cystocele, midline: Secondary | ICD-10-CM

## 2020-03-23 NOTE — Patient Instructions (Signed)
PRE ADMISSION TESTING For Covid, prior to procedure Tuesday 9:00-10:00 [address] entrance (drive up)  Results in 10-93 hours You will not receive notification if test results are negative. If positive for Covid19, your provider will notify you by phone, with additional instructions.   Anterior  Colporrhaphy, Care After This sheet gives you information about how to care for yourself after your procedure. Your health care provider may also give you more specific instructions. If you have problems or questions, contact your health care provider. What can I expect after the procedure? After the procedure, it is common to have:  Pain in the surgical area.  Vaginal discharge. You will need to use a sanitary pad during this time.  Fatigue. Follow these instructions at home: Incision care   Follow instructions from your health care provider about how to take care of your incision. Make sure you: ? Wash your hands with soap and water before touching the incision area. If soap and water are not available, use hand sanitizer. ? Clean your incision as told by your health care provider. ? Leave stitches (sutures), skin glue, or adhesive strips in place. These skin closures may need to stay in place for 2 weeks or longer. If adhesive strip edges start to loosen and curl up, you may trim the loose edges. Do not remove adhesive strips completely unless your health care provider tells you to do that.  Check your incision area every day for signs of infection. Check for: ? Redness, swelling, or pain. ? Fluid or blood. ? Warmth. ? Pus or a bad smell.  Check your incision every day to make sure the incision area is not separating or opening.  Do not take baths, swim, or use a hot tub until your health care provider approves. You may shower.  Keep the area between your vagina and rectum (perineal area) clean and dry. Make sure you clean the area after each bowel movement and each time  you urinate.  Ask your health care provider if you can take a sitz bath or sit in a tub of clean, warm water. Activity  Do gentle, daily activity as told by your health care provider. You may be told to take short walks every day and go farther each time. Ask your health care provider what activities are safe for you.  Limit stair climbing to once or twice a day in the first week, then slowly increase this activity.  Do not lift anything that is heavier than 10 lbs. (4.5 kg), or the limit that your health care provider tells you, until he or she says that it is safe. Avoid pushing or pulling motions.  Avoid standing for long periods of time.  Do not douche, use tampons, or have sex until your health care provider says it is okay.  Do not drive or use heavy machinery while taking prescription pain medicine. To prevent constipation  To prevent or treat constipation while you are taking prescription pain medicine, your health care provider may recommend that you: ? Take over-the-counter or prescription medicines. ? Eat foods that are high in fiber, such as fresh fruits and vegetables, whole grains, and beans. ? Drink enough fluid to keep your urine clear or pale yellow. ? Limit foods that are high in fat and processed sugars, such as fried and sweet foods. General instructions  You may be instructed to do pelvic floor exercises (kegels) as told by your health care provider.  Take over-the-counter and prescription medicines only  as told by your health care provider.  Keep all follow-up visits as told by your health care provider. This is important. Contact a health care provider if:  Medicine does not help your pain.  You have frequent or urgent urination, or you are unable to completely empty your bladder.  You feel a burning sensation when urinating.  You have fluid or blood coming from your incision.  You have pus or a bad smell coming from the incision.  Your incision feels  warm to the touch.  You have redness, swelling, or pain around your incision. Get help right away if:  You have a fever or chills.  Your incision separates or opens.  You cannot urinate.  You have trouble breathing. Summary  After the procedure, it is common to have pain, fatigue, and discharge from the vagina.  Keep the area between your vagina and rectum (perineal area) clean and dry. Make sure you clean the area after each bowel movement and each time you urinate.  Follow instructions from your health care provider on any activity restrictions after the procedure. This information is not intended to replace advice given to you by your health care provider. Make sure you discuss any questions you have with your health care provider. Document Revised: 02/15/2017 Document Reviewed: 03/05/2016 Elsevier Patient Education  2020 [employer].

## 2020-03-23 NOTE — H&P (View-Only) (Signed)
PRE-OPERATIVE HISTORY AND PHYSICAL EXAM  HPI:  Kristie Hoover is a 70 y.o. G0P0000 No LMP recorded. Patient has had a hysterectomy.; she is being admitted for surgery related to pelvic relaxation.  Pt has a symptomatic cystocele with vaginal pressure and discomfort, prior failed pessary use.  Denies nocturia, freq, urgency.  Prior hysterectomy.  PMHx: Past Medical History:  Diagnosis Date  . Arthritis   . Asthma   . Hypertension    Past Surgical History:  Procedure Laterality Date  . ABDOMINAL HYSTERECTOMY    . APPENDECTOMY    . COLONOSCOPY    . COLONOSCOPY WITH PROPOFOL N/A 08/19/2017   Procedure: COLONOSCOPY WITH PROPOFOL;  Surgeon: Scot Jun, MD;  Location: [facility] ENDOSCOPY;  Service: Endoscopy;  Laterality: N/A;  . ESOPHAGOGASTRODUODENOSCOPY    . ESOPHAGOGASTRODUODENOSCOPY (EGD) WITH PROPOFOL N/A 08/19/2017   Procedure: ESOPHAGOGASTRODUODENOSCOPY (EGD) WITH PROPOFOL;  Surgeon: Scot Jun, MD;  Location[facility] ENDOSCOPY;  Service: Endoscopy;  Laterality: N/A;  . LAMINECTOMY    . WRIST FUSION     Family History  Problem Relation Age of Onset  . Breast cancer Other    Social History   Tobacco Use  . Smoking status: Never Smoker  . Smokeless tobacco: Never Used  Vaping Use  . Vaping Use: Never used  Substance Use Topics  . Alcohol use: Never  . Drug use: Never    Current Outpatient Medications:  .  Biotin 35361 MCG TABS, Take 10,000 mcg by mouth daily., Disp: , Rfl:  .  cyclobenzaprine (FLEXERIL) 10 MG tablet, Take 10 mg by mouth 3 (three) times daily as needed for muscle spasms., Disp: , Rfl:  .  etodolac (LODINE) 400 MG tablet, Take 400 mg by mouth 2 (two) times daily., Disp: , Rfl:  .  Fluticasone-Salmeterol (ADVAIR) 100-50 MCG/DOSE AEPB, Inhale 1 puff into the lungs 2 (two) times daily., Disp: , Rfl:  .  glipiZIDE-metformin (METAGLIP) 2.5-500 MG tablet, Take 1 tablet by mouth daily before breakfast., Disp: , Rfl:  .  latanoprost (XALATAN) 0.005 %  ophthalmic solution, Place 1 drop into both eyes at bedtime., Disp: , Rfl:  .  montelukast (SINGULAIR) 10 MG tablet, Take 10 mg by mouth daily., Disp: , Rfl:  .  Multiple Vitamin (MULTIVITAMIN WITH MINERALS) TABS tablet, Take 1 tablet by mouth daily., Disp: , Rfl:  .  pantoprazole (PROTONIX) 40 MG tablet, Take 40 mg by mouth daily., Disp: , Rfl:  .  potassium chloride SA (K-DUR,KLOR-CON) 20 MEQ tablet, Take 20 mEq by mouth 2 (two) times daily., Disp: , Rfl:  .  pravastatin (PRAVACHOL) 40 MG tablet, Take 40 mg by mouth daily., Disp: , Rfl:  Allergies: Prednisone, Tramadol, and Erythromycin  Review of Systems  All other systems reviewed and are negative.   Objective: BP 126/82   Pulse (!) 52   Ht 5\' 8"  (1.727 m)   Wt 190 lb (86.2 kg)   SpO2 98%   BMI 28.89 kg/m   Filed Weights   03/23/20 1116  Weight: 190 lb (86.2 kg)   Physical Exam Constitutional:      General: She is not in acute distress.    Appearance: She is well-developed and well-nourished.  Genitourinary:  Breasts:     Right: No mass, skin change or tenderness.     Left: No mass, skin change or tenderness.    HENT:     Head: Normocephalic and atraumatic. No laceration.     Right Ear: Hearing normal.  Left Ear: Hearing normal.     Nose: No epistaxis or foreign body.     Mouth/Throat:     Mouth: Oropharynx is clear and moist and mucous membranes are normal.     Pharynx: Uvula midline.  Eyes:     Pupils: Pupils are equal, round, and reactive to light.  Neck:     Thyroid: No thyromegaly.  Cardiovascular:     Rate and Rhythm: Normal rate and regular rhythm.     Heart sounds: No murmur heard. No friction rub. No gallop.   Pulmonary:     Effort: Pulmonary effort is normal. No respiratory distress.     Breath sounds: Normal breath sounds. No wheezing.  Abdominal:     General: Bowel sounds are normal. There is no distension.     Palpations: Abdomen is soft.     Tenderness: There is no abdominal tenderness.  There is no rebound.  Musculoskeletal:        General: Normal range of motion.     Cervical back: Normal range of motion and neck supple.  Neurological:     Mental Status: She is alert and oriented to person, place, and time.     Cranial Nerves: No cranial nerve deficit.  Skin:    General: Skin is warm and dry.  Psychiatric:        Mood and Affect: Mood and affect normal.        Judgment: Judgment normal.  Vitals reviewed.     Assessment: 1. Cystocele, midline   Plan Anterior repair Discussed post op bladder surveillance and risk for retention. Pt counseled to have family support person available especially first 24 hours after surgery  I have had a careful discussion with this patient about all the options available and the risk/benefits of each. I have fully informed this patient that surgery may subject her to a variety of discomforts and risks: She understands that most patients have surgery with little difficulty, but problems can happen ranging from minor to fatal. These include nausea, vomiting, pain, bleeding, infection, poor healing, hernia, or formation of adhesions. Unexpected reactions may occur from any drug or anesthetic given. Unintended injury may occur to other pelvic or abdominal structures such as Fallopian tubes, ovaries, bladder, ureter (tube from kidney to bladder), or bowel. Nerves going from the pelvis to the legs may be injured. Any such injury may require immediate or later additional surgery to correct the problem. Excessive blood loss requiring transfusion is very unlikely but possible. Dangerous blood clots may form in the legs or lungs. Physical and sexual activity will be restricted in varying degrees for an indeterminate period of time but most often 2-6 weeks.  Finally, she understands that it is impossible to list every possible undesirable effect and that the condition for which surgery is done is not always cured or significantly improved, and in rare cases  may be even worse.Ample time was given to answer all questions.  Annamarie Major, MD, [facility], [facility] 03/23/2020  11:48 AM

## 2020-03-23 NOTE — Progress Notes (Signed)
   PRE-OPERATIVE HISTORY AND PHYSICAL EXAM  HPI:  Kristie Hoover is a 69 y.o. G0P0000 No LMP recorded. Patient has had a hysterectomy.; she is being admitted for surgery related to pelvic relaxation.  Pt has a symptomatic cystocele with vaginal pressure and discomfort, prior failed pessary use.  Denies nocturia, freq, urgency.  Prior hysterectomy.  PMHx: Past Medical History:  Diagnosis Date  . Arthritis   . Asthma   . Hypertension    Past Surgical History:  Procedure Laterality Date  . ABDOMINAL HYSTERECTOMY    . APPENDECTOMY    . COLONOSCOPY    . COLONOSCOPY WITH PROPOFOL N/A 08/19/2017   Procedure: COLONOSCOPY WITH PROPOFOL;  Surgeon: Elliott, Monty Mccarrell T, MD;  Location: [facility] ENDOSCOPY;  Service: Endoscopy;  Laterality: N/A;  . ESOPHAGOGASTRODUODENOSCOPY    . ESOPHAGOGASTRODUODENOSCOPY (EGD) WITH PROPOFOL N/A 08/19/2017   Procedure: ESOPHAGOGASTRODUODENOSCOPY (EGD) WITH PROPOFOL;  Surgeon: Elliott, Taronda Comacho T, MD;  Location: [facility] ENDOSCOPY;  Service: Endoscopy;  Laterality: N/A;  . LAMINECTOMY    . WRIST FUSION     Family History  Problem Relation Age of Onset  . Breast cancer Other    Social History   Tobacco Use  . Smoking status: Never Smoker  . Smokeless tobacco: Never Used  Vaping Use  . Vaping Use: Never used  Substance Use Topics  . Alcohol use: Never  . Drug use: Never    Current Outpatient Medications:  .  Biotin 10000 MCG TABS, Take 10,000 mcg by mouth daily., Disp: , Rfl:  .  cyclobenzaprine (FLEXERIL) 10 MG tablet, Take 10 mg by mouth 3 (three) times daily as needed for muscle spasms., Disp: , Rfl:  .  etodolac (LODINE) 400 MG tablet, Take 400 mg by mouth 2 (two) times daily., Disp: , Rfl:  .  Fluticasone-Salmeterol (ADVAIR) 100-50 MCG/DOSE AEPB, Inhale 1 puff into the lungs 2 (two) times daily., Disp: , Rfl:  .  glipiZIDE-metformin (METAGLIP) 2.5-500 MG tablet, Take 1 tablet by mouth daily before breakfast., Disp: , Rfl:  .  latanoprost (XALATAN) 0.005 %  ophthalmic solution, Place 1 drop into both eyes at bedtime., Disp: , Rfl:  .  montelukast (SINGULAIR) 10 MG tablet, Take 10 mg by mouth daily., Disp: , Rfl:  .  Multiple Vitamin (MULTIVITAMIN WITH MINERALS) TABS tablet, Take 1 tablet by mouth daily., Disp: , Rfl:  .  pantoprazole (PROTONIX) 40 MG tablet, Take 40 mg by mouth daily., Disp: , Rfl:  .  potassium chloride SA (K-DUR,KLOR-CON) 20 MEQ tablet, Take 20 mEq by mouth 2 (two) times daily., Disp: , Rfl:  .  pravastatin (PRAVACHOL) 40 MG tablet, Take 40 mg by mouth daily., Disp: , Rfl:  Allergies: Prednisone, Tramadol, and Erythromycin  Review of Systems  All other systems reviewed and are negative.   Objective: BP 126/82   Pulse (!) 52   Ht 5' 8" (1.727 m)   Wt 190 lb (86.2 kg)   SpO2 98%   BMI 28.89 kg/m   Filed Weights   03/23/20 1116  Weight: 190 lb (86.2 kg)   Physical Exam Constitutional:      General: She is not in acute distress.    Appearance: She is well-developed and well-nourished.  Genitourinary:  Breasts:     Right: No mass, skin change or tenderness.     Left: No mass, skin change or tenderness.    HENT:     Head: Normocephalic and atraumatic. No laceration.     Right Ear: Hearing normal.       Left Ear: Hearing normal.     Nose: No epistaxis or foreign body.     Mouth/Throat:     Mouth: Oropharynx is clear and moist and mucous membranes are normal.     Pharynx: Uvula midline.  Eyes:     Pupils: Pupils are equal, round, and reactive to light.  Neck:     Thyroid: No thyromegaly.  Cardiovascular:     Rate and Rhythm: Normal rate and regular rhythm.     Heart sounds: No murmur heard. No friction rub. No gallop.   Pulmonary:     Effort: Pulmonary effort is normal. No respiratory distress.     Breath sounds: Normal breath sounds. No wheezing.  Abdominal:     General: Bowel sounds are normal. There is no distension.     Palpations: Abdomen is soft.     Tenderness: There is no abdominal tenderness.  There is no rebound.  Musculoskeletal:        General: Normal range of motion.     Cervical back: Normal range of motion and neck supple.  Neurological:     Mental Status: She is alert and oriented to person, place, and time.     Cranial Nerves: No cranial nerve deficit.  Skin:    General: Skin is warm and dry.  Psychiatric:        Mood and Affect: Mood and affect normal.        Judgment: Judgment normal.  Vitals reviewed.     Assessment: 1. Cystocele, midline   Plan Anterior repair Discussed post op bladder surveillance and risk for retention. Pt counseled to have family support person available especially first 24 hours after surgery  I have had a careful discussion with this patient about all the options available and the risk/benefits of each. I have fully informed this patient that surgery may subject her to a variety of discomforts and risks: She understands that most patients have surgery with little difficulty, but problems can happen ranging from minor to fatal. These include nausea, vomiting, pain, bleeding, infection, poor healing, hernia, or formation of adhesions. Unexpected reactions may occur from any drug or anesthetic given. Unintended injury may occur to other pelvic or abdominal structures such as Fallopian tubes, ovaries, bladder, ureter (tube from kidney to bladder), or bowel. Nerves going from the pelvis to the legs may be injured. Any such injury may require immediate or later additional surgery to correct the problem. Excessive blood loss requiring transfusion is very unlikely but possible. Dangerous blood clots may form in the legs or lungs. Physical and sexual activity will be restricted in varying degrees for an indeterminate period of time but most often 2-6 weeks.  Finally, she understands that it is impossible to list every possible undesirable effect and that the condition for which surgery is done is not always cured or significantly improved, and in rare cases  may be even worse.Ample time was given to answer all questions.  Annamarie Major, MD, [facility], [facility] 03/23/2020  11:48 AM

## 2020-03-24 ENCOUNTER — Other Ambulatory Visit: Payer: Self-pay

## 2020-03-24 ENCOUNTER — Encounter
Admission: RE | Admit: 2020-03-24 | Discharge: 2020-03-24 | Disposition: A | Discharge status: Home / Self Care | Payer: Medicare Other | Source: Ambulatory Visit | Attending: Obstetrics & Gynecology | Admitting: Obstetrics & Gynecology

## 2020-03-24 HISTORY — DX: Personal history of other diseases of the digestive system: Z87.19

## 2020-03-24 HISTORY — DX: Type 2 diabetes mellitus without complications: E11.9

## 2020-03-24 HISTORY — DX: Gastro-esophageal reflux disease without esophagitis: K21.9

## 2020-03-24 NOTE — Patient Instructions (Addendum)
Your procedure is scheduled on:03-31-20 THURSDAY Report to the Registration Desk on the 1st floor of the Medical Mall-Then proceed to the 2nd floor Surgery Desk in the Medical Mall To find out your arrival time, please call [phone] between 1PM - 3PM on:03-30-20 WEDNESDAY  REMEMBER: Instructions that are not followed completely may result in serious medical risk, up to and including death; or upon the discretion of your surgeon and anesthesiologist your surgery may need to be rescheduled.  Do not eat food after midnight the night before surgery.  No gum chewing, lozengers or hard candies.  You may however, drink WATER up to 2 hours before you are scheduled to arrive for your surgery. Do not drink anything within 2 hours of your scheduled arrival time.  Type 1 and Type 2 diabetics should only drink water.  TAKE THESE MEDICATIONS THE MORNING OF SURGERY WITH A SIP OF WATER: -PRAVASTATIN (PRAVACHOL) -SINGULAIR (MONTELUKAST) -PROTONIX (PANTOPRAZOLE)-take one the night before and one on the morning of surgery - helps to prevent nausea after surgery.)  Use inhalers on the day of surgery-USE YOUR ADVAIR INHALER THE MORNING OF SURGERY   Stop METFORMIN-GLIPIZIDE (METAGLIP) 2 days prior to surgery-LAST DOSE  03-28-20 MONDAY  One week prior to surgery: Stop Anti-inflammatories (NSAIDS) such as LODINE (ETODOLAC), Advil, Aleve, Ibuprofen, Motrin, Naproxen, Naprosyn and Aspirin based products such as Excedrin, Goodys Powder, BC Powder-OK TO TAKE TYLENOL IF NEEDED  Stop ANY OVER THE COUNTER supplements until after surgery-STOP BIOTIN NOW-YOU MAY RESUME AFTER SURGERY (However, you may continue taking multivitamin and potassium up until the day before surgery.)  No Alcohol for 24 hours before or after surgery.  No Smoking including e-cigarettes for 24 hours prior to surgery.  No chewable tobacco products for at least 6 hours prior to surgery.  No nicotine patches on the day of surgery.  Do not  use any "recreational" drugs for at least a week prior to your surgery.  Please be advised that the combination of cocaine and anesthesia may have negative outcomes, up to and including death. If you test positive for cocaine, your surgery will be cancelled.  On the morning of surgery brush your teeth with toothpaste and water, you may rinse your mouth with mouthwash if you wish. Do not swallow any toothpaste or mouthwash.  Do not wear jewelry, make-up, hairpins, clips or nail polish.  Do not wear lotions, powders, or perfumes.   Do not shave body from the neck down 48 hours prior to surgery just in case you cut yourself which could leave a site for infection.  Also, freshly shaved skin may become irritated if using the CHG soap.  Contact lenses, hearing aids and dentures may not be worn into surgery.  Do not bring valuables to the hospital. [facility] is not responsible for any missing/lost belongings or valuables.   Notify your doctor if there is any change in your medical condition (cold, fever, infection).  Wear comfortable clothing (specific to your surgery type) to the hospital.  Plan for stool softeners for home use; pain medications have a tendency to cause constipation. You can also help prevent constipation by eating foods high in fiber such as fruits and vegetables and drinking plenty of fluids as your diet allows.  After surgery, you can help prevent lung complications by doing breathing exercises.  Take deep breaths and cough every 1-2 hours. Your doctor may order a device called an Incentive Spirometer to help you take deep breaths. When coughing or sneezing, hold  a pillow firmly against your incision with both hands. This is called "splinting." Doing this helps protect your incision. It also decreases belly discomfort.  If you are being admitted to the hospital overnight, leave your suitcase in the car. After surgery it may be brought to your room.  If you are being  discharged the day of surgery, you will not be allowed to drive home. You will need a responsible adult (18 years or older) to drive you home and stay with you that night.   If you are taking public transportation, you will need to have a responsible adult (18 years or older) with you. Please confirm with your physician that it is acceptable to use public transportation.   Please call the Pre-admissions Testing Dept. at [phone] if you have any questions about these instructions.  Visitation Policy:  Patients undergoing a surgery or procedure may have one family member or support person with them as long as that person is not COVID-19 positive or experiencing its symptoms.  That person may remain in the waiting area during the procedure.  Inpatient Visitation:    Visiting hours are 7 a.m. to 8 p.m. Patients will be allowed one visitor. The visitor may change daily. The visitor must pass COVID-19 screenings, use hand sanitizer when entering and exiting the patient's room and wear a mask at all times, including in the patient's room. Patients must also wear a mask when staff or their visitor are in the room. Masking is required regardless of vaccination status. Systemwide, no visitors 17 or younger.

## 2020-03-28 ENCOUNTER — Other Ambulatory Visit: Payer: Medicare Other

## 2020-03-29 ENCOUNTER — Other Ambulatory Visit
Admission: RE | Admit: 2020-03-29 | Discharge: 2020-03-29 | Disposition: A | Discharge status: Home / Self Care | Payer: Medicare Other | Source: Ambulatory Visit | Attending: Obstetrics & Gynecology | Admitting: Obstetrics & Gynecology

## 2020-03-29 ENCOUNTER — Telehealth: Payer: Self-pay | Admitting: Obstetrics & Gynecology

## 2020-03-29 ENCOUNTER — Other Ambulatory Visit: Payer: Self-pay

## 2020-03-29 ENCOUNTER — Other Ambulatory Visit: Admission: RE | Admit: 2020-03-29 | Payer: Medicare Other | Source: Ambulatory Visit

## 2020-03-29 DIAGNOSIS — Z20822 Contact with and (suspected) exposure to covid-19: Secondary | ICD-10-CM | POA: Insufficient documentation

## 2020-03-29 DIAGNOSIS — Z01818 Encounter for other preprocedural examination: Secondary | ICD-10-CM | POA: Insufficient documentation

## 2020-03-29 LAB — CBC
HCT: 40.9 % (ref 36.0–46.0)
Hemoglobin: 13.4 g/dL (ref 12.0–15.0)
MCH: 29.4 pg (ref 26.0–34.0)
MCHC: 32.8 g/dL (ref 30.0–36.0)
MCV: 89.7 fL (ref 80.0–100.0)
Platelets: 216 10*3/uL (ref 150–400)
RBC: 4.56 MIL/uL (ref 3.87–5.11)
RDW: 14.4 % (ref 11.5–15.5)
WBC: 6.3 10*3/uL (ref 4.0–10.5)
nRBC: 0 % (ref 0.0–0.2)

## 2020-03-29 LAB — BASIC METABOLIC PANEL
Anion gap: 12 (ref 5–15)
BUN: 13 mg/dL (ref 8–23)
CO2: 29 mmol/L (ref 22–32)
Calcium: 9.9 mg/dL (ref 8.9–10.3)
Chloride: 103 mmol/L (ref 98–111)
Creatinine, Ser: 0.79 mg/dL (ref 0.44–1.00)
GFR, Estimated: >60 mL/min (ref >60)
Glucose, Bld: 135 mg/dL — ABNORMAL HIGH (ref 70–99)
Potassium: 3.3 mmol/L — ABNORMAL LOW (ref 3.5–5.1)
Sodium: 144 mmol/L (ref 135–145)

## 2020-03-29 LAB — TYPE AND SCREEN
ABO/RH(D): A POS
Antibody Screen: NEGATIVE

## 2020-03-29 LAB — PROTIME-INR
INR: 1.1 (ref 0.8–1.2)
Prothrombin Time: 13.6 seconds (ref 11.4–15.2)

## 2020-03-29 LAB — SARS CORONAVIRUS 2 (TAT 6-24 HRS): SARS Coronavirus 2: NEGATIVE

## 2020-03-29 LAB — APTT: aPTT: 32 seconds (ref 24–36)

## 2020-03-29 NOTE — Progress Notes (Signed)
Pt aware.

## 2020-03-29 NOTE — Progress Notes (Signed)
Let pt know potassium is slightly low, and to take over the counter potassium supplements today and tomorrow prior to surgery Thurs.

## 2020-03-29 NOTE — Telephone Encounter (Signed)
We have rec'd medical clearance from Dr Arlana Pouch for patient's 03/31/20 DOS.

## 2020-03-31 ENCOUNTER — Encounter
Admission: RE | Disposition: A | Discharge status: Home / Self Care | Payer: Self-pay | Source: Home / Self Care | Attending: Obstetrics & Gynecology

## 2020-03-31 ENCOUNTER — Ambulatory Visit: Payer: Medicare Other | Admitting: Anesthesiology

## 2020-03-31 ENCOUNTER — Other Ambulatory Visit: Payer: Self-pay

## 2020-03-31 ENCOUNTER — Encounter: Payer: Self-pay | Admitting: Obstetrics & Gynecology

## 2020-03-31 ENCOUNTER — Ambulatory Visit
Admission: RE | Admit: 2020-03-31 | Discharge: 2020-03-31 | Disposition: A | Discharge status: Home / Self Care | Payer: Medicare Other | Attending: Obstetrics & Gynecology | Admitting: Obstetrics & Gynecology

## 2020-03-31 DIAGNOSIS — Z881 Allergy status to other antibiotic agents status: Secondary | ICD-10-CM | POA: Insufficient documentation

## 2020-03-31 DIAGNOSIS — Z7951 Long term (current) use of inhaled steroids: Secondary | ICD-10-CM | POA: Diagnosis not present

## 2020-03-31 DIAGNOSIS — Z885 Allergy status to narcotic agent status: Secondary | ICD-10-CM | POA: Diagnosis not present

## 2020-03-31 DIAGNOSIS — Z7984 Long term (current) use of oral hypoglycemic drugs: Secondary | ICD-10-CM | POA: Insufficient documentation

## 2020-03-31 DIAGNOSIS — N8111 Cystocele, midline: Secondary | ICD-10-CM | POA: Diagnosis present

## 2020-03-31 DIAGNOSIS — Z888 Allergy status to other drugs, medicaments and biological substances status: Secondary | ICD-10-CM | POA: Diagnosis not present

## 2020-03-31 DIAGNOSIS — N816 Rectocele: Secondary | ICD-10-CM | POA: Diagnosis present

## 2020-03-31 DIAGNOSIS — Z90710 Acquired absence of both cervix and uterus: Secondary | ICD-10-CM | POA: Diagnosis not present

## 2020-03-31 DIAGNOSIS — Z803 Family history of malignant neoplasm of breast: Secondary | ICD-10-CM | POA: Insufficient documentation

## 2020-03-31 DIAGNOSIS — Z79899 Other long term (current) drug therapy: Secondary | ICD-10-CM | POA: Insufficient documentation

## 2020-03-31 DIAGNOSIS — N8110 Cystocele, unspecified: Secondary | ICD-10-CM | POA: Diagnosis present

## 2020-03-31 HISTORY — PX: RECTOCELE REPAIR: SHX761

## 2020-03-31 HISTORY — PX: CYSTOCELE REPAIR: SHX163

## 2020-03-31 LAB — POCT I-STAT, CHEM 8
BUN: 11 mg/dL (ref 8–23)
Calcium, Ion: 1.24 mmol/L (ref 1.15–1.40)
Chloride: 105 mmol/L (ref 98–111)
Creatinine, Ser: 0.8 mg/dL (ref 0.44–1.00)
Glucose, Bld: 161 mg/dL — ABNORMAL HIGH (ref 70–99)
HCT: 43 % (ref 36.0–46.0)
Hemoglobin: 14.6 g/dL (ref 12.0–15.0)
Potassium: 3.9 mmol/L (ref 3.5–5.1)
Sodium: 144 mmol/L (ref 135–145)
TCO2: 24 mmol/L (ref 22–32)

## 2020-03-31 LAB — GLUCOSE, CAPILLARY
Glucose-Capillary: 162 mg/dL — ABNORMAL HIGH (ref 70–99)
Glucose-Capillary: 153 mg/dL — ABNORMAL HIGH (ref 70–99)

## 2020-03-31 LAB — ABO/RH: ABO/RH(D): A POS

## 2020-03-31 SURGERY — COLPORRHAPHY, ANTERIOR, FOR CYSTOCELE REPAIR
Anesthesia: General

## 2020-03-31 MED ORDER — ESTROGENS, CONJUGATED 0.625 MG/GM VA CREA
TOPICAL_CREAM | VAGINAL | Status: DC | PRN
  Administered 2020-03-31: 1 via VAGINAL

## 2020-03-31 MED ORDER — MORPHINE SULFATE (PF) 2 MG/ML IV SOLN: 1.0000 mg | INTRAVENOUS | Status: DC | PRN

## 2020-03-31 MED ORDER — FENTANYL CITRATE (PF) 100 MCG/2ML IJ SOLN
INTRAMUSCULAR | Status: AC
  Filled 2020-03-31: qty 2

## 2020-03-31 MED ORDER — MIDAZOLAM HCL 2 MG/2ML IJ SOLN
INTRAMUSCULAR | Status: DC | PRN
  Administered 2020-03-31 (×2): 1 mg via INTRAVENOUS

## 2020-03-31 MED ORDER — ROCURONIUM BROMIDE 100 MG/10ML IV SOLN
INTRAVENOUS | Status: DC | PRN
  Administered 2020-03-31: 40 mg via INTRAVENOUS

## 2020-03-31 MED ORDER — LACTATED RINGERS IV SOLN: INTRAVENOUS | Status: DC

## 2020-03-31 MED ORDER — OXYCODONE HCL 5 MG/5ML PO SOLN: 5.0000 mg | Freq: Once | ORAL | Status: DC | PRN

## 2020-03-31 MED ORDER — FENTANYL CITRATE (PF) 100 MCG/2ML IJ SOLN: 25.0000 ug | INTRAMUSCULAR | Status: DC | PRN

## 2020-03-31 MED ORDER — LIDOCAINE HCL (CARDIAC) PF 100 MG/5ML IV SOSY
PREFILLED_SYRINGE | INTRAVENOUS | Status: DC | PRN
  Administered 2020-03-31: 100 mg via INTRAVENOUS

## 2020-03-31 MED ORDER — SODIUM CHLORIDE 0.9 % IV SOLN: INTRAVENOUS | Status: DC

## 2020-03-31 MED ORDER — DEXAMETHASONE SODIUM PHOSPHATE 10 MG/ML IJ SOLN
INTRAMUSCULAR | Status: DC | PRN
  Administered 2020-03-31: 5 mg via INTRAVENOUS

## 2020-03-31 MED ORDER — SUCCINYLCHOLINE CHLORIDE 20 MG/ML IJ SOLN
INTRAMUSCULAR | Status: DC | PRN
  Administered 2020-03-31: 160 mg via INTRAVENOUS

## 2020-03-31 MED ORDER — MIDAZOLAM HCL 2 MG/2ML IJ SOLN
INTRAMUSCULAR | Status: AC
  Filled 2020-03-31: qty 2

## 2020-03-31 MED ORDER — ORAL CARE MOUTH RINSE: 15.0000 mL | Freq: Once | OROMUCOSAL | Status: AC

## 2020-03-31 MED ORDER — OXYCODONE-ACETAMINOPHEN 5-325 MG PO TABS: 1.0000 | ORAL_TABLET | ORAL | 0 refills | Status: DC | PRN

## 2020-03-31 MED ORDER — ACETAMINOPHEN 650 MG RE SUPP
650.0000 mg | RECTAL | Status: DC | PRN
  Filled 2020-03-31: qty 1

## 2020-03-31 MED ORDER — SUGAMMADEX SODIUM 500 MG/5ML IV SOLN
INTRAVENOUS | Status: DC | PRN
  Administered 2020-03-31: 200 mg via INTRAVENOUS
  Administered 2020-03-31: 100 mg via INTRAVENOUS

## 2020-03-31 MED ORDER — PROPOFOL 10 MG/ML IV BOLUS
INTRAVENOUS | Status: DC | PRN
  Administered 2020-03-31: 150 mg via INTRAVENOUS

## 2020-03-31 MED ORDER — LIDOCAINE-EPINEPHRINE 1 %-1:100000 IJ SOLN
INTRAMUSCULAR | Status: DC | PRN
Start: 2020-03-31 — End: 2020-03-31
  Administered 2020-03-31: 15 mL

## 2020-03-31 MED ORDER — GLYCOPYRROLATE 0.2 MG/ML IJ SOLN
INTRAMUSCULAR | Status: DC | PRN
  Administered 2020-03-31: .2 mg via INTRAVENOUS

## 2020-03-31 MED ORDER — FENTANYL CITRATE (PF) 100 MCG/2ML IJ SOLN
INTRAMUSCULAR | Status: DC | PRN
  Administered 2020-03-31 (×3): 50 ug via INTRAVENOUS

## 2020-03-31 MED ORDER — PHENYLEPHRINE HCL (PRESSORS) 10 MG/ML IV SOLN
INTRAVENOUS | Status: AC
  Filled 2020-03-31: qty 1

## 2020-03-31 MED ORDER — EPHEDRINE 5 MG/ML INJ
INTRAVENOUS | Status: AC
  Filled 2020-03-31: qty 10

## 2020-03-31 MED ORDER — ACETAMINOPHEN 10 MG/ML IV SOLN
INTRAVENOUS | Status: DC | PRN
  Administered 2020-03-31: 1000 mg via INTRAVENOUS

## 2020-03-31 MED ORDER — PROPOFOL 10 MG/ML IV BOLUS
INTRAVENOUS | Status: AC
  Filled 2020-03-31: qty 20

## 2020-03-31 MED ORDER — CEFAZOLIN SODIUM-DEXTROSE 1-4 GM/50ML-% IV SOLN
INTRAVENOUS | Status: AC
  Filled 2020-03-31: qty 50

## 2020-03-31 MED ORDER — PHENYLEPHRINE HCL (PRESSORS) 10 MG/ML IV SOLN
INTRAVENOUS | Status: DC | PRN
  Administered 2020-03-31 (×2): 100 ug via INTRAVENOUS
  Administered 2020-03-31 (×2): 200 ug via INTRAVENOUS

## 2020-03-31 MED ORDER — CHLORHEXIDINE GLUCONATE 0.12 % MT SOLN
15.0000 mL | Freq: Once | OROMUCOSAL | Status: AC
  Administered 2020-03-31: 15 mL via OROMUCOSAL

## 2020-03-31 MED ORDER — ACETAMINOPHEN 325 MG PO TABS: 650.0000 mg | ORAL_TABLET | ORAL | Status: DC | PRN

## 2020-03-31 MED ORDER — CEFAZOLIN (ANCEF) 1 G IV SOLR
1.0000 g | INTRAVENOUS | Status: AC
  Administered 2020-03-31: 2 g
  Filled 2020-03-31: qty 1

## 2020-03-31 MED ORDER — DEXAMETHASONE SODIUM PHOSPHATE 10 MG/ML IJ SOLN
INTRAMUSCULAR | Status: AC
  Filled 2020-03-31: qty 1

## 2020-03-31 MED ORDER — ACETAMINOPHEN 10 MG/ML IV SOLN
INTRAVENOUS | Status: AC
  Filled 2020-03-31: qty 100

## 2020-03-31 MED ORDER — CHLORHEXIDINE GLUCONATE 0.12 % MT SOLN
OROMUCOSAL | Status: AC
  Filled 2020-03-31: qty 15

## 2020-03-31 MED ORDER — OXYCODONE HCL 5 MG PO TABS
5.0000 mg | ORAL_TABLET | Freq: Once | ORAL | Status: DC | PRN
Start: 2020-03-31 — End: 2020-03-31

## 2020-03-31 MED ORDER — DEXMEDETOMIDINE (PRECEDEX) IN NS 20 MCG/5ML (4 MCG/ML) IV SYRINGE
PREFILLED_SYRINGE | INTRAVENOUS | Status: DC | PRN
  Administered 2020-03-31: 8 ug via INTRAVENOUS

## 2020-03-31 MED ORDER — POVIDONE-IODINE 10 % EX SWAB
2.0000 "application " | Freq: Once | CUTANEOUS | Status: AC
  Administered 2020-03-31: 2 via TOPICAL

## 2020-03-31 MED ORDER — LIDOCAINE HCL (PF) 2 % IJ SOLN
INTRAMUSCULAR | Status: AC
  Filled 2020-03-31: qty 15

## 2020-03-31 MED ORDER — DEXMEDETOMIDINE (PRECEDEX) IN NS 20 MCG/5ML (4 MCG/ML) IV SYRINGE
PREFILLED_SYRINGE | INTRAVENOUS | Status: AC
  Filled 2020-03-31: qty 5

## 2020-03-31 MED ORDER — CEFAZOLIN SODIUM-DEXTROSE 2-4 GM/100ML-% IV SOLN
INTRAVENOUS | Status: AC
  Filled 2020-03-31: qty 100

## 2020-03-31 MED ORDER — KETOROLAC TROMETHAMINE 30 MG/ML IJ SOLN
INTRAMUSCULAR | Status: DC | PRN
  Administered 2020-03-31: 30 mg via INTRAVENOUS

## 2020-03-31 MED ORDER — ONDANSETRON HCL 4 MG/2ML IJ SOLN
INTRAMUSCULAR | Status: DC | PRN
  Administered 2020-03-31 (×2): 4 mg via INTRAVENOUS

## 2020-03-31 MED ORDER — OXYCODONE-ACETAMINOPHEN 5-325 MG PO TABS: 1.0000 | ORAL_TABLET | ORAL | Status: DC | PRN

## 2020-03-31 MED ORDER — ONDANSETRON HCL 4 MG/2ML IJ SOLN
INTRAMUSCULAR | Status: AC
  Filled 2020-03-31: qty 4

## 2020-03-31 SURGICAL SUPPLY — 49 items
BAG DRN RND TRDRP ANRFLXCHMBR (UROLOGICAL SUPPLIES) ×1
BAG URINE DRAIN 2000ML AR STRL (UROLOGICAL SUPPLIES) ×2 IMPLANT
BLADE SURG 15 STRL LF DISP TIS (BLADE) ×1 IMPLANT
BLADE SURG 15 STRL SS (BLADE) ×2
BLADE SURG SZ10 CARB STEEL (BLADE) ×2 IMPLANT
CATH FOLEY 2WAY  5CC 16FR (CATHETERS) ×2
CATH FOLEY 2WAY 5CC 16FR (CATHETERS) ×1
CATH URTH 16FR FL 2W BLN LF (CATHETERS) ×1 IMPLANT
COVER WAND RF STERILE (DRAPES) ×2 IMPLANT
DRAPE 3/4 80X56 (DRAPES) IMPLANT
DRAPE PERI LITHO V/GYN (MISCELLANEOUS) ×2 IMPLANT
DRAPE UNDER BUTTOCK W/FLU (DRAPES) ×2 IMPLANT
ELECT REM PT RETURN 9FT ADLT (ELECTROSURGICAL) ×2
ELECTRODE REM PT RTRN 9FT ADLT (ELECTROSURGICAL) ×1 IMPLANT
GAUZE 4X4 16PLY RFD (DISPOSABLE) ×2 IMPLANT
GAUZE PACK 2X3YD (PACKING) ×2 IMPLANT
GAUZE PACKING FOLDED 2  STR (GAUZE/BANDAGES/DRESSINGS) ×2
GAUZE PACKING FOLDED 2 STR (GAUZE/BANDAGES/DRESSINGS) ×1 IMPLANT
GLOVE BIO SURGEON STRL SZ8 (GLOVE) ×8 IMPLANT
GOWN STRL REUS W/ TWL LRG LVL3 (GOWN DISPOSABLE) ×3 IMPLANT
GOWN STRL REUS W/ TWL XL LVL3 (GOWN DISPOSABLE) ×1 IMPLANT
GOWN STRL REUS W/TWL LRG LVL3 (GOWN DISPOSABLE) ×6
GOWN STRL REUS W/TWL XL LVL3 (GOWN DISPOSABLE) ×2
KIT TURNOVER CYSTO (KITS) ×2 IMPLANT
KIT TURNOVER KIT A (KITS) ×2 IMPLANT
LABEL OR SOLS (LABEL) ×2 IMPLANT
MANIFOLD NEPTUNE II (INSTRUMENTS) ×2 IMPLANT
NDL HPO THNWL 1X22GA REG BVL (NEEDLE) ×1 IMPLANT
NEEDLE SAFETY 22GX1 (NEEDLE) ×2
NEEDLE SPNL 22GX3.5 QUINCKE BK (NEEDLE) ×2 IMPLANT
NS IRRIG 500ML POUR BTL (IV SOLUTION) ×2 IMPLANT
PACK BASIN MINOR ARMC (MISCELLANEOUS) ×2 IMPLANT
PAD OB MATERNITY 4.3X12.25 (PERSONAL CARE ITEMS) ×2 IMPLANT
PAD PREP 24X41 OB/GYN DISP (PERSONAL CARE ITEMS) ×2 IMPLANT
RETRACTOR PHONTONGUIDE ADAPT (ADAPTER) IMPLANT
SOL PREP PVP 2OZ (MISCELLANEOUS) ×2
SOLUTION PREP PVP 2OZ (MISCELLANEOUS) ×1 IMPLANT
SUT ETHIBOND NAB CT1 #1 30IN (SUTURE) ×8 IMPLANT
SUT VIC AB 0 CT1 27 (SUTURE) ×2
SUT VIC AB 0 CT1 27XCR 8 STRN (SUTURE) ×1 IMPLANT
SUT VIC AB 2-0 CT1 (SUTURE) ×4 IMPLANT
SUT VIC AB 2-0 CT1 27 (SUTURE) ×2
SUT VIC AB 2-0 CT1 TAPERPNT 27 (SUTURE) ×1 IMPLANT
SUT VIC AB 3-0 SH 27 (SUTURE) ×2
SUT VIC AB 3-0 SH 27X BRD (SUTURE) ×1 IMPLANT
SYR 10ML LL (SYRINGE) ×2 IMPLANT
SYR BULB IRRIG 60ML STRL (SYRINGE) ×2 IMPLANT
SYR CONTROL 10ML LL (SYRINGE) ×2 IMPLANT
TAPE TRANSPORE STRL 2 31045 (GAUZE/BANDAGES/DRESSINGS) ×2 IMPLANT

## 2020-03-31 NOTE — Anesthesia Procedure Notes (Signed)
Procedure Name: Intubation Performed by: Fletcher-Harrison, Chlora Mcbain, CRNA Pre-anesthesia Checklist: Patient identified, Emergency Drugs available, Suction available and Patient being monitored Patient Re-evaluated:Patient Re-evaluated prior to induction Oxygen Delivery Method: Circle system utilized Preoxygenation: Pre-oxygenation with 100% oxygen Induction Type: IV induction Ventilation: Mask ventilation without difficulty Laryngoscope Size: McGraph and 3 Grade View: Grade I Tube type: Oral Tube size: 6.5 mm Number of attempts: 1 Airway Equipment and Method: Stylet and Oral airway Placement Confirmation: ETT inserted through vocal cords under direct vision,  positive ETCO2,  breath sounds checked- equal and bilateral and CO2 detector Secured at: 21 cm Tube secured with: Tape Dental Injury: Teeth and Oropharynx as per pre-operative assessment        

## 2020-03-31 NOTE — OR Nursing (Signed)
Patient felt the need to void. Unable to void in the BR. Bladder scanned for 287 mls/ Dr. Tiburcio Pea notified. Instructed to remove packing and I+O cath. Packing removed and catherized for 300 mls using a #8 fr. Catheter.

## 2020-03-31 NOTE — Op Note (Signed)
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  ANESTHESIA: General endotracheal anesthesia  ESTIMATED BLOOD LOSS: Minimal  SPECIMENS: None.  COMPLICATIONS: None  DISPOSITION: stable to PACU  FINDINGS: Cystocele and rectocele, grade 3 Vaginal vault prolapse  PROCEDURE:   Patient was taken to the OR where she was placed in dorsal lithotomy in candy cane stirrups. She was prepped and draped in the usual sterile fashion. A timeout was performed. Foley is placed into bladder. A speculum was placed inside the vagina. Above findings noted.   Anterior colporrhaphy is performed.  Allis clamps are placed along the anterior vaginal wall, lidocaine is used to infiltrate the plane, and incision is made midline vertical.  Endopelvic fascia is dissected free of vaginal mucosa.  Fascia is plicated w interrupted vicryl sutures.  Excess mucosa is excised.  Vaginal incision is closed with a running locking Vicryl suture.  Excellent hemostasis was noted at the end of the case.    Posterior colporrhaphy is performed.  Allis clamps are placed along the posterior vaginal wall, lidocaine is used to infiltrate the plane, and incision is made midline vertical.  Endopelvic fascia is dissected free of vaginal mucosa.  Fascia is plicated w interrupted vicryl sutures.  Ethibond suture also used at the introitus.  Excess mucosa is excised.  Vaginal incision is closed with a running locking Vicryl suture.  Perineorrhaphy is performed on the external skin as well as to strengthen and plicate the perineal body.    Excellent hemostasis was noted at the end of the case.    Packing gauze w Premarin cream is placed. A Foley  catheter is removed. . Clear, yellow urine was noted. All instrument needle and lap counts were correct x 2. Patient was awakened taken to recovery room in stable condition.  The assistance of my assisting-physician was vital to resect and retract interchangably with self on each side.  Annamarie Major, MD, [facility], [facility] 03/31/2020  9:10 AM

## 2020-03-31 NOTE — Anesthesia Postprocedure Evaluation (Signed)
Anesthesia Post Note  Patient: Kristie Hoover  Procedure(s) Performed: ANTERIOR REPAIR (CYSTOCELE) (N/A ) POSTERIOR REPAIR (RECTOCELE) (N/A )  Patient location during evaluation: PACU Anesthesia Type: General Level of consciousness: awake and alert Pain management: pain level controlled Vital Signs Assessment: post-procedure vital signs reviewed and stable Respiratory status: spontaneous breathing, nonlabored ventilation and respiratory function stable Cardiovascular status: blood pressure returned to baseline and stable Postop Assessment: no apparent nausea or vomiting Anesthetic complications: no   No complications documented.   Last Vitals:  Vitals:   03/31/20 1023 03/31/20 1135  BP: (!) 162/79 (!) 175/83  Pulse: 66 78  Resp: 18 18  Temp: (!) 35.9 C   SpO2: 100% 97%    Last Pain:  Vitals:   03/31/20 1135  TempSrc:   PainSc: 0-No pain                 Christia Reading

## 2020-03-31 NOTE — Interval H&P Note (Signed)
History and Physical Interval Note:  03/31/2020 7:16 AM  Kristie Hoover  has presented today for surgery, with the diagnosis of Cystocele N81.11.  The various methods of treatment have been discussed with the patient and family. After consideration of risks, benefits and other options for treatment, the patient has consented to  Procedure(s): ANTERIOR REPAIR (CYSTOCELE) (N/A) as a surgical intervention.  The patient's history has been reviewed, patient examined, no change in status, stable for surgery.  I have reviewed the patient's chart and labs.  Questions were answered to the patient's satisfaction.     Letitia Libra

## 2020-03-31 NOTE — Transfer of Care (Signed)
Immediate Anesthesia Transfer of Care Note  Patient: Kristie Hoover  Procedure(s) Performed: ANTERIOR REPAIR (CYSTOCELE) (N/A ) POSTERIOR REPAIR (RECTOCELE) (N/A )  Patient Location: PACU  Anesthesia Type:General  Level of Consciousness: awake, drowsy and patient cooperative  Airway & Oxygen Therapy: Patient Spontanous Breathing and Patient connected to face mask oxygen  Post-op Assessment: Report given to RN and Post -op Vital signs reviewed and stable  Post vital signs: Reviewed and stable  Last Vitals:  Vitals Value Taken Time  BP    Temp    Pulse 61 03/31/20 0924  Resp 10 03/31/20 0924  SpO2 100 % 03/31/20 0924  Vitals shown include unvalidated device data.  Last Pain:  Vitals:   03/31/20 0915  TempSrc:   PainSc: Asleep         Complications: No complications documented.

## 2020-03-31 NOTE — Discharge Instructions (Signed)
AMBULATORY SURGERY  DISCHARGE INSTRUCTIONS   1) The drugs that you were given will stay in your system until tomorrow so for the next 24 hours you should not:  A) Drive an automobile B) Make any legal decisions C) Drink any alcoholic beverage   2) You may resume regular meals tomorrow.  Today it is better to start with liquids and gradually work up to solid foods.  You may eat anything you prefer, but it is better to start with liquids, then soup and crackers, and gradually work up to solid foods.   3) Please notify your doctor immediately if you have any unusual bleeding, trouble breathing, redness and pain at the surgery site, drainage, fever, or pain not relieved by medication.    4) Additional Instructions:        Please contact your physician with any problems or Same Day Surgery at [phone], Monday through Friday 6 am to 4 pm, or [place] at [facility] number at [phone].Anterior and Posterior Colporrhaphy, Care After The following information offers guidance on how to care for yourself after your procedure. Your health care provider may also give you more specific instructions. If you have problems or questions, contact your health care provider. What can I expect after the procedure? After the procedure, it is common to have:  Pain in the surgical area.  Vaginal spotting and discharge. You will need to use a sanitary pad during this time.  Tiredness (fatigue). Follow these instructions at home: Medicines  Take over-the-counter and prescription medicines only as told by your health care provider.  Ask your health care provider if the medicine prescribed to you: ? Requires you to avoid driving or using machinery. ? Can cause constipation. You may need to take these actions to prevent or treat constipation:  Drink enough fluid to keep your urine pale yellow.  Take over-the-counter or prescription medicines.  Eat foods that are high in  fiber, such as beans, whole grains, and fresh fruits and vegetables.  Limit foods that are high in fat and processed sugars, such as fried or sweet foods. Incision care  Check your incision area every day for signs of infection. Check for: ? More fluid or blood coming from your vagina. ? Pus or a bad-smelling discharge from your vagina.  Do not take baths, swim, or use a hot tub until your health care provider approves. You may take showers.  Keep the area between your vagina and rectum (perineal area) clean and dry. Make sure you clean the area after every bowel movement and each time you urinate.  Ask your health care provider if you can take a sitz bath or sit in a tub of clean, warm water. Activity  Rest as told by your health care provider.  Avoid sitting for a long time without moving. Get up to take short walks every 1-2 hours. This is important to improve blood flow and breathing. Ask for help if you feel weak or unsteady.  Avoid activities that take a lot of effort.  Limit stair climbing to once or twice a day in the first week, then slowly increase this activity.  Do not lift anything that is heavier than 5 lb (2.3 kg), or the limit that you are told, until your health care provider says that it is safe. Avoid pushing or pulling motions.  Avoid standing for long periods of time.  Do not drive until your health care provider says that it is safe.  Return to your normal activities as told by your health care provider. Ask your health care provider what activities are safe for you.   General instructions  You may be instructed to do pelvic floor exercises (Kegel exercises). Do them as told by your health care provider.  Avoid sex for several weeks after surgery.  Do not douche or use tampons until your health care provider says it is okay.  Wear compression stockings as told by your health care provider. These stockings help to prevent blood clots and reduce swelling in  your legs.  Keep all follow-up visits. This is important. Contact a health care provider if:  Medicine does not help your pain.  You have frequent or urgent urination, or you are unable to completely empty your bladder.  You feel a burning sensation when urinating.  You have more fluid or blood coming from your vaginal area.  You have pus or a bad-smelling discharge coming from the vaginal area. Get help right away if:  You cannot urinate.  You have a fever or chills.  You have trouble breathing. These symptoms may represent a serious problem that is an emergency. Do not wait to see if the symptoms will go away. Get medical help right away. Call your local emergency services (911 in the [place] Do not drive yourself to the hospital. Summary  After the procedure, it is common to have pain, tiredness (fatigue), and vaginal spotting and discharge.  Take medicines only as told by your health care provider. Ask your health care provider whether your medicines may cause constipation or require you to avoid driving or using machinery.  Keep the area between your vagina and rectum (perineal area) area clean and dry. Clean the area after every bowel movement and each time you urinate.  Rest after the procedure. Avoid activities that require a lot of effort, and do not lift heavy objects.  Get help right away if you have trouble breathing, you have a fever or chills, or you cannot urinate. This information is not intended to replace advice given to you by your health care provider. Make sure you discuss any questions you have with your health care provider. Document Revised: 09/01/2019 Document Reviewed: 09/01/2019 Elsevier Patient Education  2021 [employer]   AMBULATORY SURGERY  DISCHARGE INSTRUCTIONS   5) The drugs that you were given will stay in your system until tomorrow so for the next 24 hours you should not:  D) Drive an automobile E) Make any legal decisions F) Drink  any alcoholic beverage   6) You may resume regular meals tomorrow.  Today it is better to start with liquids and gradually work up to solid foods.  You may eat anything you prefer, but it is better to start with liquids, then soup and crackers, and gradually work up to solid foods.   7) Please notify your doctor immediately if you have any unusual bleeding, trouble breathing, redness and pain at the surgery site, drainage, fever, or pain not relieved by medication.  8) Your post-operative visit with Dr.                                     is: Date:                        Time:    Please call to schedule your post-operative visit.  9) Additional  Instructions:

## 2020-03-31 NOTE — Anesthesia Preprocedure Evaluation (Signed)
Anesthesia Evaluation  Patient identified by MRN, date of birth, ID band Patient awake    Reviewed: Allergy & Precautions, H&P , NPO status , Patient's Chart, lab work & pertinent test results  History of Anesthesia Complications Negative for: history of anesthetic complications  Airway Mallampati: III  TM Distance: >3 FB Neck ROM: full    Dental  (+) Chipped, Poor Dentition, Missing, Edentulous Upper   Pulmonary neg shortness of breath, asthma ,    Pulmonary exam normal        Cardiovascular Exercise Tolerance: Good hypertension, (-) angina(-) Past MI and (-) DOE Normal cardiovascular exam     Neuro/Psych negative neurological ROS  negative psych ROS   GI/Hepatic Neg liver ROS, hiatal hernia, GERD  Medicated and Controlled,  Endo/Other  diabetes, Type 2  Renal/GU      Musculoskeletal  (+) Arthritis ,   Abdominal   Peds  Hematology negative hematology ROS (+)   Anesthesia Other Findings Past Medical History: No date: Arthritis No date: Asthma     Comment:  WELL CONTROLLED No date: Diabetes mellitus without complication (HCC) No date: GERD (gastroesophageal reflux disease) No date: History of hiatal hernia No date: Hypertension     Comment:  NO MEDS  Past Surgical History: No date: ABDOMINAL HYSTERECTOMY No date: APPENDECTOMY No date: COLONOSCOPY 08/19/2017: COLONOSCOPY WITH PROPOFOL; N/A     Comment:  Procedure: COLONOSCOPY WITH PROPOFOL;  Surgeon: Scot Jun, MD;  Location[facility];  Service:               Endoscopy;  Laterality: N/A; No date: ESOPHAGOGASTRODUODENOSCOPY 08/19/2017: ESOPHAGOGASTRODUODENOSCOPY (EGD) WITH PROPOFOL; N/A     Comment:  Procedure: ESOPHAGOGASTRODUODENOSCOPY (EGD) WITH               PROPOFOL;  Surgeon: Scot Jun, MD;  Location:               [facility] ENDOSCOPY;  Service: Endoscopy;  Laterality: N/A; No date: LAMINECTOMY No date: WRIST  FUSION     Reproductive/Obstetrics negative OB ROS                             Anesthesia Physical Anesthesia Plan  ASA: III  Anesthesia Plan: General ETT   Post-op Pain Management:    Induction: Intravenous  PONV Risk Score and Plan: Ondansetron, Dexamethasone, Midazolam and Treatment may vary due to age or medical condition  Airway Management Planned: Oral ETT  Additional Equipment:   Intra-op Plan:   Post-operative Plan: Extubation in OR  Informed Consent: I have reviewed the patients History and Physical, chart, labs and discussed the procedure including the risks, benefits and alternatives for the proposed anesthesia with the patient or authorized representative who has indicated his/her understanding and acceptance.     Dental Advisory Given  Plan Discussed with: Anesthesiologist, CRNA and Surgeon  Anesthesia Plan Comments: (Patient consented for risks of anesthesia including but not limited to:  - adverse reactions to medications - damage to eyes, teeth, lips or other oral mucosa - nerve damage due to positioning  - sore throat or hoarseness - Damage to heart, brain, nerves, lungs, other parts of body or loss of life  Patient voiced understanding.)        Anesthesia Quick Evaluation

## 2020-04-07 ENCOUNTER — Telehealth: Payer: Self-pay

## 2020-04-07 NOTE — Telephone Encounter (Signed)
Rec Dulcolax suppository and colace stool softener.  Not needing Rx for this, but counsel her to get these over the counter

## 2020-04-07 NOTE — Telephone Encounter (Signed)
Pt calling triage c/o bad constipation since surgery, has not had a BM in 2 weeks. Requesting medication be sent in From [facility]

## 2020-04-07 NOTE — Telephone Encounter (Signed)
Tried to call pt. No answer or VM. Please let her know when she calls back

## 2020-04-18 ENCOUNTER — Other Ambulatory Visit: Payer: Self-pay

## 2020-04-18 ENCOUNTER — Encounter: Payer: Self-pay | Admitting: Obstetrics & Gynecology

## 2020-04-18 ENCOUNTER — Ambulatory Visit (INDEPENDENT_AMBULATORY_CARE_PROVIDER_SITE_OTHER): Payer: Medicare Other | Admitting: Obstetrics & Gynecology

## 2020-04-18 VITALS — BP 120/80 | Ht 68.0 in | Wt 183.0 lb

## 2020-04-18 DIAGNOSIS — Z9889 Other specified postprocedural states: Secondary | ICD-10-CM | POA: Insufficient documentation

## 2020-04-18 NOTE — Progress Notes (Signed)
  Postoperative Follow-up Patient presents post op from anterior colporrhaphy and posterior colporrhaphy for pelvic relaxation, 2 weeks ago.  Subjective: Patient reports marked improvement in her preop symptoms. Eating a regular diet without difficulty. The patient is not having any pain.  Activity: normal activities of daily living. Patient reports additional symptom's since surgery of None.  Objective: BP 120/80   Ht 5\' 8"  (1.727 m)   Wt 183 lb (83 kg)   BMI 27.83 kg/m  Physical Exam Constitutional:      General: She is not in acute distress.    Appearance: She is well-developed and well-nourished.  Musculoskeletal:        General: Normal range of motion.  Neurological:     Mental Status: She is alert and oriented to person, place, and time.  Skin:    General: Skin is warm and dry.  Psychiatric:        Mood and Affect: Mood and affect normal.  Vitals reviewed.     Assessment: s/p :  anterior colporrhaphy and posterior colporrhaphy progressing well  Plan: Patient has done well after surgery with no apparent complications.  I have discussed the post-operative course to date, and the expected progress moving forward.  The patient understands what complications to be concerned about.  I will see the patient in routine follow up, or sooner if needed.    Activity plan: No heavy lifting.  Pelvic rest. Exam 4 weeks  04/18/2020, 10:56 AM

## 2020-05-16 ENCOUNTER — Other Ambulatory Visit: Payer: Self-pay

## 2020-05-16 ENCOUNTER — Encounter: Payer: Self-pay | Admitting: Obstetrics & Gynecology

## 2020-05-16 ENCOUNTER — Ambulatory Visit (INDEPENDENT_AMBULATORY_CARE_PROVIDER_SITE_OTHER): Payer: Medicare Other | Admitting: Obstetrics & Gynecology

## 2020-05-16 VITALS — BP 130/80 | Ht 68.0 in | Wt 185.0 lb

## 2020-05-16 DIAGNOSIS — Z9889 Other specified postprocedural states: Secondary | ICD-10-CM

## 2020-05-16 DIAGNOSIS — M25512 Pain in left shoulder: Secondary | ICD-10-CM

## 2020-05-16 NOTE — Progress Notes (Signed)
  Postoperative Follow-up Patient presents post op from anterior colporrhaphy and posterior colporrhaphy for pelvic relaxation, 6 weeks ago.  Subjective: Patient reports marked improvement in her preop symptoms of vaginal bulge and pressure.  No bleeding.  No urinary sx's.  Eating a regular diet without difficulty. The patient is not having any pain.  Activity: normal activities of daily living. Patient reports additional symptom's since surgery of left shoulder pain w activity, limits ability to attend to certain ADLs.  Objective: BP 130/80   Ht 5\' 8"  (1.727 m)   Wt 185 lb (83.9 kg)   BMI 28.13 kg/m  Physical Exam Constitutional:      General: She is not in acute distress.    Appearance: She is well-developed.  Genitourinary:     Rectum normal.     Genitourinary Comments: Cervix and uterus absent. Vaginal cuff healing well.     No vaginal erythema or bleeding.      Right Adnexa: not tender and no mass present.    Left Adnexa: not tender and no mass present.    Cervix is absent.     Uterus is absent.  Cardiovascular:     Rate and Rhythm: Normal rate.  Pulmonary:     Effort: Pulmonary effort is normal.  Abdominal:     General: There is no distension.     Palpations: Abdomen is soft.     Tenderness: There is no abdominal tenderness.     Comments: Incision healing well.  Musculoskeletal:        General: Normal range of motion.  Neurological:     Mental Status: She is alert and oriented to person, place, and time.     Cranial Nerves: No cranial nerve deficit.  Skin:    General: Skin is warm and dry.     Assessment: s/p :  anterior colporrhaphy and posterior colporrhaphy progressing well  Plan: Patient has done well after surgery with no apparent complications.  I have discussed the post-operative course to date, and the expected progress moving forward.  The patient understands what complications to be concerned about.  I will see the patient in routine follow up, or  sooner if needed.    Activity plan: No restriction.  Ortho referral for persistent left shoulder pain and disability since GYN surgery (from positioning perhaps)  05/16/2020, 11:47 AM

## 2020-06-01 ENCOUNTER — Other Ambulatory Visit: Payer: Self-pay | Admitting: Orthopedic Surgery

## 2020-06-01 DIAGNOSIS — M12812 Other specific arthropathies, not elsewhere classified, left shoulder: Secondary | ICD-10-CM

## 2020-06-15 ENCOUNTER — Ambulatory Visit
Admission: RE | Admit: 2020-06-15 | Discharge: 2020-06-15 | Disposition: A | Discharge status: Home / Self Care | Payer: Medicare Other | Source: Ambulatory Visit | Attending: Orthopedic Surgery | Admitting: Orthopedic Surgery

## 2020-06-15 ENCOUNTER — Other Ambulatory Visit: Payer: Self-pay

## 2020-06-15 DIAGNOSIS — M12812 Other specific arthropathies, not elsewhere classified, left shoulder: Secondary | ICD-10-CM | POA: Diagnosis not present

## 2020-06-16 ENCOUNTER — Ambulatory Visit: Payer: Medicare Other

## 2020-07-26 ENCOUNTER — Other Ambulatory Visit: Payer: Self-pay | Admitting: Surgery

## 2020-08-03 ENCOUNTER — Other Ambulatory Visit
Admission: RE | Admit: 2020-08-03 | Discharge: 2020-08-03 | Disposition: A | Discharge status: Home / Self Care | Payer: Medicare Other | Source: Ambulatory Visit | Attending: Surgery | Admitting: Surgery

## 2020-08-03 NOTE — Pre-Procedure Instructions (Signed)
Pt was a no show for her pre op appt today for her upcoming surgery for total shoulder-called pt and she had gotten the dates mixed up-pt coming in tomorrow 08-04-20 at 8 am for her preop interview

## 2020-08-04 ENCOUNTER — Other Ambulatory Visit: Payer: Self-pay

## 2020-08-04 ENCOUNTER — Encounter: Payer: Self-pay | Admitting: Surgery

## 2020-08-04 ENCOUNTER — Encounter
Admission: RE | Admit: 2020-08-04 | Discharge: 2020-08-04 | Disposition: A | Discharge status: Home / Self Care | Payer: Medicare Other | Source: Ambulatory Visit | Attending: Surgery | Admitting: Surgery

## 2020-08-04 DIAGNOSIS — Z01812 Encounter for preprocedural laboratory examination: Secondary | ICD-10-CM | POA: Insufficient documentation

## 2020-08-04 LAB — URINALYSIS, ROUTINE W REFLEX MICROSCOPIC
Bacteria, UA: NONE SEEN
Glucose, UA: 50 mg/dL — AB
Hgb urine dipstick: NEGATIVE
Ketones, ur: 5 mg/dL — AB
Nitrite: NEGATIVE
Protein, ur: NEGATIVE mg/dL
Specific Gravity, Urine: 1.033 — ABNORMAL HIGH (ref 1.005–1.030)
pH: 5 (ref 5.0–8.0)

## 2020-08-04 LAB — CBC WITH DIFFERENTIAL/PLATELET
Abs Immature Granulocytes: 0.01 10*3/uL (ref 0.00–0.07)
Basophils Absolute: 0 10*3/uL (ref 0.0–0.1)
Basophils Relative: 1 %
Eosinophils Absolute: 0.5 10*3/uL (ref 0.0–0.5)
Eosinophils Relative: 7 %
HCT: 39.9 % (ref 36.0–46.0)
Hemoglobin: 13 g/dL (ref 12.0–15.0)
Immature Granulocytes: 0 %
Lymphocytes Relative: 38 %
Lymphs Abs: 2.4 10*3/uL (ref 0.7–4.0)
MCH: 29.1 pg (ref 26.0–34.0)
MCHC: 32.6 g/dL (ref 30.0–36.0)
MCV: 89.3 fL (ref 80.0–100.0)
Monocytes Absolute: 0.4 10*3/uL (ref 0.1–1.0)
Monocytes Relative: 6 %
Neutro Abs: 3.1 10*3/uL (ref 1.7–7.7)
Neutrophils Relative %: 48 %
Platelets: 232 10*3/uL (ref 150–400)
RBC: 4.47 MIL/uL (ref 3.87–5.11)
RDW: 14.1 % (ref 11.5–15.5)
WBC: 6.4 10*3/uL (ref 4.0–10.5)
nRBC: 0 % (ref 0.0–0.2)

## 2020-08-04 LAB — TYPE AND SCREEN
ABO/RH(D): A POS
Antibody Screen: NEGATIVE

## 2020-08-04 LAB — COMPREHENSIVE METABOLIC PANEL
ALT: 24 U/L (ref 0–44)
AST: 33 U/L (ref 15–41)
Albumin: 3.9 g/dL (ref 3.5–5.0)
Alkaline Phosphatase: 109 U/L (ref 38–126)
Anion gap: 10 (ref 5–15)
BUN: 18 mg/dL (ref 8–23)
CO2: 26 mmol/L (ref 22–32)
Calcium: 10 mg/dL (ref 8.9–10.3)
Chloride: 106 mmol/L (ref 98–111)
Creatinine, Ser: 0.88 mg/dL (ref 0.44–1.00)
GFR, Estimated: >60 mL/min (ref >60)
Glucose, Bld: 163 mg/dL — ABNORMAL HIGH (ref 70–99)
Potassium: 3.8 mmol/L (ref 3.5–5.1)
Sodium: 142 mmol/L (ref 135–145)
Total Bilirubin: 0.5 mg/dL (ref 0.3–1.2)
Total Protein: 7.9 g/dL (ref 6.5–8.1)

## 2020-08-04 LAB — SURGICAL PCR SCREEN
MRSA, PCR: NEGATIVE
Staphylococcus aureus: NEGATIVE

## 2020-08-04 NOTE — Patient Instructions (Addendum)
INSTRUCTIONS FOR SURGERY     Your surgery is scheduled for:   Thursday, MAY 26TH     To find out your arrival time for the day of surgery,          please call [phone] between 1 pm and 3 pm on :  Wednesday, MAY 25TH     When you arrive for surgery, report to the REGISTRATION DESK IN THE MEDICAL MALL.  ONCE THEY HAVE COMPLETED THEIR PROCESS, PROCEED TO THE SECOND FLOOR AND SIGN IN AT THE SURGERY DESK.    REMEMBER: Instructions that are not followed completely may result in serious medical risk,  up to and including death, or upon the discretion of your surgeon and anesthesiologist,            your surgery may need to be rescheduled.  __X__ 1. Do not eat food after midnight the night before your procedure.                    No gum, candy, lozenger, tic tacs, tums or hard candies.                  ABSOLUTELY NOTHING SOLID IN YOUR MOUTH AFTER MIDNIGHT                    You may drink unlimited clear liquids up to 2 hours before you are scheduled to arrive for surgery.                   Do not drink anything within those 2 hours unless you need to take medicine, then take the                   smallest amount you need.  Clear liquids include:  water, apple juice without pulp,                   any flavor Gatorade, Black coffee, black tea.  Sugar may be added but no dairy/ honey /lemon.                        Broth and jello is not considered a clear liquid.  __x__  2. On the morning of surgery, please brush your teeth with toothpaste and water. You may rinse with                  mouthwash if you wish but DO NOT SWALLOW TOOTHPASTE OR MOUTHWASH  __X___3. NO alcohol for 24 hours before or after surgery.  __x___ 4.  Do NOT smoke or use e-cigarettes for 24 HOURS PRIOR TO SURGERY.                      DO NOT Use any chewable tobacco products for at least 6 hours prior to surgery.  __x___ 5. If you start any new medication after this  appointment and prior to surgery, please                   Bring it with you on the day of surgery.  ___x__ 6. Notify your doctor if there  is any change in your medical condition, such as fever,                   infection, vomitting,diarrhea or any open sores.  __x___ 7.  USE the CHG SOAP as instructed, the night before surgery and the day of surgery.                   Once you have washed with this soap, do NOT use any of the following: Powders, perfumes                    or lotions. Please do not wear make up, hairpins, clips or nail polish. You MAY NOT wear                    Deodorant. Women need to shave 48 hours prior to surgery.                   DO NOT wear ANY jewelry on the day of surgery. If there are rings that are too tight to                    remove easily, please address this prior to the surgery day. Piercings need to be removed.                                                                     NO METAL ON YOUR BODY.                   Do NOT bring any valuables.  If you came to Pre-Admit testing then you will not need license,                    insurance card or credit card.  If you will be staying overnight, please either leave your things in                    the car or have your family be responsible for these items.                    [facility] IS NOT RESPONSIBLE FOR BELONGINGS OR VALUABLES.  ___X__ 8. DO NOT wear contact lenses on surgery day.  You may not have dentures,                     Hearing aides, contacts or glasses in the operating room. These items can be                    Placed in the Recovery Room to receive immediately after surgery.  ___x__ 10. Take the following medications on the morning of surgery with a sip of water:                              1. ADVAIR                     2. SINGULAIR                     3. PRAVASTATIN  4. PROTONIX (take a dose the night before surgery as well as the am.)                     5.  ALPHAGAN EYE DROPS                     6. TIMOLOL EYE DROPS  __X___ 11.  Follow any instructions provided to you by your surgeon.                        MAKE SURE TO USE THE BENZYL PEROXIDE AS INSTRUCTED.  __X__  12. STOP ALL ASPIRIN PRODUCTS AS OF TODAY, MAY 19TH                       THIS INCLUDES BC POWDERS / GOODIES POWDER  __x___ 13. STOP Anti-inflammatories as of TODAY, MAY 19TH                      This includes IBUPROFEN / MOTRIN / ADVIL / ALEVE/ NAPROXYN                    STOP ETODALAC/LODINE AS WELL.                    YOU MAY TAKE TYLENOL ANY TIME PRIOR TO SURGERY.  __X___ 14.  Stop supplements until after surgery.                     This includes: BIOTIN // MULTIVITAMINS //                    __X____16.  Stop Glipizide/Metformin 2 full days prior to surgery.  LAST DOSE ON 08/08/20                           Do NOT take any diabetes medications on surgery day.  __x____17.  Continue to take the following medications but do not take on the morning of surgery:                          LASIX // POTASSIUM  __X____18. If staying overnight, please have appropriate shoes to wear to be able to walk around the unit.                   Wear clean and comfortable clothing to the hospital.  PLEASE BRING PHONE NUMBERS FOR YOUR CONTACTS.  HAVE CELL PHONE AND CHARGER IF YOU HAVE THEM. YOU MUST HAVE A MASK ON AT ALL TIMES IN THE HOSPITAL. ONCE IN YOUR ROOM, YOU    MAY GO WITHOUT YOUR MASK IF ALONE.  HAVE STOOL SOFTENERS AT HOME AVAILABLE TO USE AFTER SURGERY.

## 2020-08-04 NOTE — Patient Instructions (Addendum)
INSTRUCTIONS FOR SURGERY     Your surgery is scheduled for:   Thursday, MAY 26TH     To find out your arrival time for the day of surgery,          please call [phone] between 1 pm and 3 pm on :  Wednesday, MAY 25TH     When you arrive for surgery, report to the REGISTRATION DESK IN THE MEDICAL MALL. ONCE THEY HAVE COMPLETED THEIR PROCESS, PROCEED TO THE SECOND FLOOR AND SIGN IN AT THE SURGERY DESK.    REMEMBER: Instructions that are not followed completely may result in serious medical risk,  up to and including death, or upon the discretion of your surgeon and anesthesiologist,            your surgery may need to be rescheduled.  __X__ 1. Do not eat food after midnight the night before your procedure.                    No gum, candy, lozenger, tic tacs, tums or hard candies.                  ABSOLUTELY NOTHING SOLID IN YOUR MOUTH AFTER MIDNIGHT                    You may drink unlimited clear liquids up to 2 hours before you are scheduled to arrive for surgery.                   Do not drink anything within those 2 hours unless you need to take medicine, then take the                   smallest amount you need.  Clear liquids include:  water, apple juice without pulp,                   any flavor Gatorade, Black coffee, black tea.  Sugar may be added but no dairy/ honey /lemon.                        Broth and jello is not considered a clear liquid.  __x__  2. On the morning of surgery, please brush your teeth with toothpaste and water. You may rinse with                  mouthwash if you wish but DO NOT SWALLOW TOOTHPASTE OR MOUTHWASH  __X___3. NO alcohol for 24 hours before or after surgery.  __x___ 4.  Do NOT smoke or use e-cigarettes for 24 HOURS PRIOR TO SURGERY.                      DO NOT  Use any chewable tobacco products for at least 6 hours prior to surgery.  __x___ 5. If you start any new medication after this  appointment and prior to surgery, please                   Bring it with you on the day of surgery.  ___x__ 6. Notify your doctor if there  is any change in your medical condition, such as fever,                   infection, vomitting, diarrhea or any open sores.  __x___ 7.  USE the CHG SOAP as instructed, the night before surgery and the day of surgery.                   Once you have washed with this soap, do NOT use any of the following: Powders, perfumes                    or lotions. Please do not wear make up, hairpins, clips or nail polish. You MAY NOT wear                    Deodorant.  Women need to shave 48 hours prior to surgery.                   DO NOT wear ANY jewelry on the day of surgery. If there are rings that are too tight to                    remove easily, please address this prior to the surgery day. Piercings need to be removed.                                                                     NO METAL ON YOUR BODY.                    Do NOT bring any valuables.  If you came to Pre-Admit testing then you will not need license,                     insurance card or credit card.  If you will be staying overnight, please either leave your things in                     the car or have your family be responsible for these items.                     [facility] IS NOT RESPONSIBLE FOR BELONGINGS OR VALUABLES.  ___X__ 8. DO NOT wear contact lenses on surgery day.  You may not have dentures,                     Hearing aides, contacts or glasses in the operating room. These items can be                    Placed in the Recovery Room to receive immediately after surgery.  __x___ 9. IF YOU ARE SCHEDULED TO GO HOME ON THE SAME DAY, YOU MUST                   Have someone to drive you home and to stay with you  for the first 24 hours.                    Have an arrangement prior to arriving on surgery day.  ___x__ 10. Take the following medications on the  morning of surgery with  a sip of water:                              1. ADVAIR                     2. SINGULAIR                     3. PROTONIX(take a dose the night before surgery as well as morning of surgery)                     4. PRAVASTATIN                     5. ALL EYE DROPS THAT YOU USUALLY TAKE IN THE MORNING.                     6.  __X___ 11.  Follow any instructions provided to you by your surgeon.                      __X__  12. STOP ALL ASPIRIN PRODUCTS AS OF TODAY, MAY 19TH                       THIS INCLUDES BC POWDERS / GOODIES POWDER  __x___ 13. STOP Anti-inflammatories as of TODAY, MAY 19TH                      This includes IBUPROFEN / MOTRIN / ADVIL / ALEVE/ NAPROXYN / ETODALAC                    YOU MAY TAKE TYLENOL ANY TIME PRIOR TO SURGERY.  __X_ 14.  Stop supplements until after surgery.                     This includes: BIOTIN // MULTIVITAMINS                  __X____16.  Stop GLIPIZIDE/Metformin 2 full days prior to surgery.  Stop on:MAY 23RD                                    Do NOT take any diabetes medications on surgery day.  __X____17.  Continue to take the following medications but do not take on the morning of surgery:                           LASIX // POTASSIUM  __X____18. If staying overnight, please have appropriate shoes to wear to be able to walk around the unit.                   Wear clean and comfortable clothing to the hospital.  WEAR A LARGE SHIRT OR A BUTTON DOWN SHIRT FOR EASE OF DRESSING. BRING PHONE NUMBERS OF CONTACT PEOPLE.  HAVE STOOL SOFTENERS AVAILABLE FOR HOME USE AFTER SURGERY.

## 2020-08-04 NOTE — Pre-Procedure Instructions (Signed)
Met with patient for her presurgical interview.  She seems to be somewhat unsure of someone bringing her to the hospital and staying with her after surgery. She lives alone and several family members told her that they can not help her out.  She was instructed to work on this situation as she MUST have someone with her after anesthesia.   She does not have a computer / internet / cell phone so she is unable to watch the Educational Video.  I reviewed the joint replacement book in lieu of this.  All instructions provided in writing to patient.

## 2020-08-09 ENCOUNTER — Other Ambulatory Visit: Payer: Self-pay

## 2020-08-09 ENCOUNTER — Other Ambulatory Visit
Admission: RE | Admit: 2020-08-09 | Discharge: 2020-08-09 | Disposition: A | Discharge status: Home / Self Care | Payer: Medicare Other | Source: Ambulatory Visit | Attending: Surgery | Admitting: Surgery

## 2020-08-09 DIAGNOSIS — Z01812 Encounter for preprocedural laboratory examination: Secondary | ICD-10-CM | POA: Insufficient documentation

## 2020-08-09 DIAGNOSIS — Z20822 Contact with and (suspected) exposure to covid-19: Secondary | ICD-10-CM | POA: Diagnosis not present

## 2020-08-09 LAB — SARS CORONAVIRUS 2 (TAT 6-24 HRS): SARS Coronavirus 2: NEGATIVE

## 2020-08-10 MED ORDER — CEFAZOLIN SODIUM-DEXTROSE 2-4 GM/100ML-% IV SOLN
2.0000 g | INTRAVENOUS | Status: AC
  Administered 2020-08-11: 2 g via INTRAVENOUS

## 2020-08-10 MED ORDER — SODIUM CHLORIDE 0.9 % IV SOLN: INTRAVENOUS | Status: DC

## 2020-08-10 MED ORDER — ORAL CARE MOUTH RINSE: 15.0000 mL | Freq: Once | OROMUCOSAL | Status: DC

## 2020-08-10 MED ORDER — CHLORHEXIDINE GLUCONATE 0.12 % MT SOLN: 15.0000 mL | Freq: Once | OROMUCOSAL | Status: DC

## 2020-08-11 ENCOUNTER — Other Ambulatory Visit: Payer: Self-pay

## 2020-08-11 ENCOUNTER — Encounter
Admission: RE | Disposition: A | Discharge status: Home / Self Care | Payer: Self-pay | Source: Home / Self Care | Attending: Surgery

## 2020-08-11 ENCOUNTER — Encounter: Payer: Self-pay | Admitting: Surgery

## 2020-08-11 ENCOUNTER — Observation Stay
Admission: RE | Admit: 2020-08-11 | Discharge: 2020-08-13 | Disposition: A | Discharge status: Home / Self Care | Payer: Medicare Other | Attending: Surgery | Admitting: Surgery

## 2020-08-11 ENCOUNTER — Inpatient Hospital Stay: Payer: Medicare Other | Admitting: Urgent Care

## 2020-08-11 ENCOUNTER — Inpatient Hospital Stay: Payer: Medicare Other

## 2020-08-11 ENCOUNTER — Inpatient Hospital Stay: Payer: Medicare Other | Admitting: Anesthesiology

## 2020-08-11 DIAGNOSIS — Z79899 Other long term (current) drug therapy: Secondary | ICD-10-CM | POA: Insufficient documentation

## 2020-08-11 DIAGNOSIS — M75122 Complete rotator cuff tear or rupture of left shoulder, not specified as traumatic: Secondary | ICD-10-CM | POA: Insufficient documentation

## 2020-08-11 DIAGNOSIS — M25512 Pain in left shoulder: Secondary | ICD-10-CM | POA: Diagnosis present

## 2020-08-11 DIAGNOSIS — I10 Essential (primary) hypertension: Secondary | ICD-10-CM | POA: Diagnosis not present

## 2020-08-11 DIAGNOSIS — Z7984 Long term (current) use of oral hypoglycemic drugs: Secondary | ICD-10-CM | POA: Diagnosis not present

## 2020-08-11 DIAGNOSIS — E119 Type 2 diabetes mellitus without complications: Secondary | ICD-10-CM | POA: Diagnosis not present

## 2020-08-11 DIAGNOSIS — Z7982 Long term (current) use of aspirin: Secondary | ICD-10-CM | POA: Diagnosis not present

## 2020-08-11 DIAGNOSIS — J45909 Unspecified asthma, uncomplicated: Secondary | ICD-10-CM | POA: Insufficient documentation

## 2020-08-11 DIAGNOSIS — M19012 Primary osteoarthritis, left shoulder: Secondary | ICD-10-CM | POA: Diagnosis not present

## 2020-08-11 DIAGNOSIS — Z96612 Presence of left artificial shoulder joint: Secondary | ICD-10-CM

## 2020-08-11 HISTORY — PX: REVERSE SHOULDER ARTHROPLASTY: SHX5054

## 2020-08-11 LAB — GLUCOSE, CAPILLARY
Glucose-Capillary: 178 mg/dL — ABNORMAL HIGH (ref 70–99)
Glucose-Capillary: 180 mg/dL — ABNORMAL HIGH (ref 70–99)
Glucose-Capillary: 206 mg/dL — ABNORMAL HIGH (ref 70–99)
Glucose-Capillary: 163 mg/dL — ABNORMAL HIGH (ref 70–99)

## 2020-08-11 LAB — POCT I-STAT, CHEM 8
BUN: 7 mg/dL — ABNORMAL LOW (ref 8–23)
Calcium, Ion: 1.19 mmol/L (ref 1.15–1.40)
Chloride: 102 mmol/L (ref 98–111)
Creatinine, Ser: 0.6 mg/dL (ref 0.44–1.00)
Glucose, Bld: 176 mg/dL — ABNORMAL HIGH (ref 70–99)
HCT: 37 % (ref 36.0–46.0)
Hemoglobin: 12.6 g/dL (ref 12.0–15.0)
Potassium: 3.1 mmol/L — ABNORMAL LOW (ref 3.5–5.1)
Sodium: 142 mmol/L (ref 135–145)
TCO2: 26 mmol/L (ref 22–32)

## 2020-08-11 SURGERY — ARTHROPLASTY, SHOULDER, TOTAL, REVERSE
Anesthesia: General | Site: Shoulder | Laterality: Left

## 2020-08-11 MED ORDER — BUPIVACAINE LIPOSOME 1.3 % IJ SUSP
INTRAMUSCULAR | Status: AC
  Filled 2020-08-11: qty 20

## 2020-08-11 MED ORDER — DEXAMETHASONE SODIUM PHOSPHATE 10 MG/ML IJ SOLN
INTRAMUSCULAR | Status: DC | PRN
  Administered 2020-08-11: 4 mg via INTRAVENOUS

## 2020-08-11 MED ORDER — ACETAMINOPHEN 160 MG/5ML PO SOLN
325.0000 mg | ORAL | Status: DC | PRN
  Filled 2020-08-11: qty 20.3

## 2020-08-11 MED ORDER — BUPIVACAINE-EPINEPHRINE (PF) 0.5% -1:200000 IJ SOLN
INTRAMUSCULAR | Status: DC | PRN
  Administered 2020-08-11: 30 mL via PERINEURAL

## 2020-08-11 MED ORDER — PHENYLEPHRINE HCL (PRESSORS) 10 MG/ML IV SOLN
INTRAVENOUS | Status: AC
  Filled 2020-08-11: qty 1

## 2020-08-11 MED ORDER — DIPHENHYDRAMINE HCL 12.5 MG/5ML PO ELIX
12.5000 mg | ORAL_SOLUTION | ORAL | Status: DC | PRN
Start: 2020-08-11 — End: 2020-08-13

## 2020-08-11 MED ORDER — CEFAZOLIN SODIUM-DEXTROSE 2-4 GM/100ML-% IV SOLN
INTRAVENOUS | Status: AC
  Administered 2020-08-11: 2 g via INTRAVENOUS
  Filled 2020-08-11: qty 100

## 2020-08-11 MED ORDER — FLEET ENEMA 7-19 GM/118ML RE ENEM: 1.0000 | ENEMA | Freq: Once | RECTAL | Status: DC | PRN

## 2020-08-11 MED ORDER — MIDAZOLAM HCL 2 MG/2ML IJ SOLN
INTRAMUSCULAR | Status: AC
  Administered 2020-08-11: 1 mg via INTRAVENOUS
  Filled 2020-08-11: qty 2

## 2020-08-11 MED ORDER — BIOTIN 10000 MCG PO TABS: 20000.0000 ug | ORAL_TABLET | Freq: Every day | ORAL | Status: DC

## 2020-08-11 MED ORDER — ACETAMINOPHEN 500 MG PO TABS
1000.0000 mg | ORAL_TABLET | Freq: Four times a day (QID) | ORAL | Status: AC
  Administered 2020-08-11: 1000 mg via ORAL
  Filled 2020-08-11: qty 2

## 2020-08-11 MED ORDER — TRANEXAMIC ACID 1000 MG/10ML IV SOLN
INTRAVENOUS | Status: DC | PRN
  Administered 2020-08-11: 1000 mg via INTRAVENOUS

## 2020-08-11 MED ORDER — TIMOLOL MALEATE 0.5 % OP SOLN
1.0000 [drp] | Freq: Two times a day (BID) | OPHTHALMIC | Status: DC
  Administered 2020-08-11 – 2020-08-13 (×4): 1 [drp] via OPHTHALMIC
  Filled 2020-08-11: qty 5

## 2020-08-11 MED ORDER — MAGNESIUM HYDROXIDE 400 MG/5ML PO SUSP: 30.0000 mL | Freq: Every day | ORAL | Status: DC | PRN

## 2020-08-11 MED ORDER — POTASSIUM CHLORIDE CRYS ER 20 MEQ PO TBCR
20.0000 meq | EXTENDED_RELEASE_TABLET | Freq: Two times a day (BID) | ORAL | Status: DC
  Administered 2020-08-11 – 2020-08-13 (×5): 20 meq via ORAL
  Filled 2020-08-11 (×5): qty 1

## 2020-08-11 MED ORDER — INSULIN ASPART 100 UNIT/ML IJ SOLN
0.0000 [IU] | Freq: Three times a day (TID) | INTRAMUSCULAR | Status: DC
  Administered 2020-08-11: 5 [IU] via SUBCUTANEOUS
  Administered 2020-08-13: 3 [IU] via SUBCUTANEOUS
  Filled 2020-08-11 (×2): qty 1

## 2020-08-11 MED ORDER — METOCLOPRAMIDE HCL 10 MG PO TABS: 5.0000 mg | ORAL_TABLET | Freq: Three times a day (TID) | ORAL | Status: DC | PRN

## 2020-08-11 MED ORDER — EPHEDRINE 5 MG/ML INJ
INTRAVENOUS | Status: AC
  Filled 2020-08-11: qty 10

## 2020-08-11 MED ORDER — GLYCOPYRROLATE 0.2 MG/ML IJ SOLN
INTRAMUSCULAR | Status: DC | PRN
  Administered 2020-08-11: .2 mg via INTRAVENOUS

## 2020-08-11 MED ORDER — LIDOCAINE HCL (CARDIAC) PF 100 MG/5ML IV SOSY
PREFILLED_SYRINGE | INTRAVENOUS | Status: DC | PRN
  Administered 2020-08-11: 80 mg via INTRAVENOUS

## 2020-08-11 MED ORDER — SODIUM CHLORIDE (PF) 0.9 % IJ SOLN
INTRAMUSCULAR | Status: AC
  Filled 2020-08-11: qty 20

## 2020-08-11 MED ORDER — KETOROLAC TROMETHAMINE 15 MG/ML IJ SOLN
INTRAMUSCULAR | Status: AC
  Filled 2020-08-11: qty 1

## 2020-08-11 MED ORDER — MIDAZOLAM HCL 2 MG/2ML IJ SOLN: 1.0000 mg | Freq: Once | INTRAMUSCULAR | Status: AC

## 2020-08-11 MED ORDER — ACETAMINOPHEN 325 MG PO TABS
325.0000 mg | ORAL_TABLET | Freq: Four times a day (QID) | ORAL | Status: DC | PRN
  Administered 2020-08-12: 650 mg via ORAL
  Filled 2020-08-11: qty 2

## 2020-08-11 MED ORDER — ADULT MULTIVITAMIN W/MINERALS CH
1.0000 | ORAL_TABLET | Freq: Every day | ORAL | Status: DC
  Administered 2020-08-11: 1 via ORAL
  Filled 2020-08-11: qty 1

## 2020-08-11 MED ORDER — ACETAMINOPHEN 10 MG/ML IV SOLN
INTRAVENOUS | Status: AC
  Filled 2020-08-11: qty 100

## 2020-08-11 MED ORDER — CYCLOBENZAPRINE HCL 10 MG PO TABS
10.0000 mg | ORAL_TABLET | Freq: Three times a day (TID) | ORAL | Status: DC | PRN
  Administered 2020-08-11: 10 mg via ORAL
  Filled 2020-08-11: qty 1

## 2020-08-11 MED ORDER — DEXAMETHASONE SODIUM PHOSPHATE 10 MG/ML IJ SOLN
INTRAMUSCULAR | Status: AC
  Filled 2020-08-11: qty 1

## 2020-08-11 MED ORDER — FUROSEMIDE 40 MG PO TABS
40.0000 mg | ORAL_TABLET | Freq: Every day | ORAL | Status: DC
  Administered 2020-08-11: 40 mg via ORAL
  Filled 2020-08-11: qty 1

## 2020-08-11 MED ORDER — PHENYLEPHRINE HCL-NACL 10-0.9 MG/250ML-% IV SOLN
INTRAVENOUS | Status: DC | PRN
  Administered 2020-08-11: 50 ug/min via INTRAVENOUS

## 2020-08-11 MED ORDER — VASOPRESSIN 20 UNIT/ML IV SOLN
INTRAVENOUS | Status: DC | PRN
  Administered 2020-08-11 (×3): .5 [IU] via INTRAVENOUS

## 2020-08-11 MED ORDER — ROCURONIUM BROMIDE 10 MG/ML (PF) SYRINGE
PREFILLED_SYRINGE | INTRAVENOUS | Status: AC
  Filled 2020-08-11: qty 10

## 2020-08-11 MED ORDER — EPHEDRINE SULFATE 50 MG/ML IJ SOLN
INTRAMUSCULAR | Status: DC | PRN
  Administered 2020-08-11: 10 mg via INTRAVENOUS
  Administered 2020-08-11: 7.5 mg via INTRAVENOUS
  Administered 2020-08-11 (×2): 10 mg via INTRAVENOUS

## 2020-08-11 MED ORDER — DROPERIDOL 2.5 MG/ML IJ SOLN: 0.6250 mg | Freq: Once | INTRAMUSCULAR | Status: DC | PRN

## 2020-08-11 MED ORDER — FENTANYL CITRATE (PF) 100 MCG/2ML IJ SOLN
INTRAMUSCULAR | Status: AC
  Filled 2020-08-11: qty 2

## 2020-08-11 MED ORDER — LIDOCAINE HCL (PF) 1 % IJ SOLN
INTRAMUSCULAR | Status: AC
  Filled 2020-08-11: qty 5

## 2020-08-11 MED ORDER — MOMETASONE FURO-FORMOTEROL FUM 100-5 MCG/ACT IN AERO
2.0000 | INHALATION_SPRAY | Freq: Two times a day (BID) | RESPIRATORY_TRACT | Status: DC
  Administered 2020-08-11 – 2020-08-13 (×4): 2 via RESPIRATORY_TRACT
  Filled 2020-08-11: qty 8.8

## 2020-08-11 MED ORDER — ACETAMINOPHEN 10 MG/ML IV SOLN
INTRAVENOUS | Status: DC | PRN
  Administered 2020-08-11: 1000 mg via INTRAVENOUS

## 2020-08-11 MED ORDER — GLIPIZIDE 5 MG PO TABS
2.5000 mg | ORAL_TABLET | Freq: Every day | ORAL | Status: DC
  Administered 2020-08-12 – 2020-08-13 (×2): 2.5 mg via ORAL
  Filled 2020-08-11 (×2): qty 1

## 2020-08-11 MED ORDER — MONTELUKAST SODIUM 10 MG PO TABS
10.0000 mg | ORAL_TABLET | Freq: Every day | ORAL | Status: DC
  Administered 2020-08-12 – 2020-08-13 (×2): 10 mg via ORAL
  Filled 2020-08-11 (×2): qty 1

## 2020-08-11 MED ORDER — KETOROLAC TROMETHAMINE 15 MG/ML IJ SOLN: 7.5000 mg | Freq: Four times a day (QID) | INTRAMUSCULAR | Status: AC

## 2020-08-11 MED ORDER — BUPIVACAINE HCL (PF) 0.5 % IJ SOLN
INTRAMUSCULAR | Status: AC
  Filled 2020-08-11: qty 10

## 2020-08-11 MED ORDER — CEFAZOLIN SODIUM-DEXTROSE 2-4 GM/100ML-% IV SOLN
2.0000 g | Freq: Four times a day (QID) | INTRAVENOUS | Status: AC
  Administered 2020-08-11 – 2020-08-12 (×2): 2 g via INTRAVENOUS
  Filled 2020-08-11 (×3): qty 100

## 2020-08-11 MED ORDER — FENTANYL CITRATE (PF) 100 MCG/2ML IJ SOLN
INTRAMUSCULAR | Status: AC
  Administered 2020-08-11: 50 ug via INTRAVENOUS
  Filled 2020-08-11: qty 2

## 2020-08-11 MED ORDER — LATANOPROST 0.005 % OP SOLN
1.0000 [drp] | Freq: Every day | OPHTHALMIC | Status: DC
  Administered 2020-08-11 – 2020-08-12 (×2): 1 [drp] via OPHTHALMIC
  Filled 2020-08-11: qty 2.5

## 2020-08-11 MED ORDER — PROPOFOL 10 MG/ML IV BOLUS
INTRAVENOUS | Status: DC | PRN
  Administered 2020-08-11: 100 mg via INTRAVENOUS

## 2020-08-11 MED ORDER — FENTANYL CITRATE (PF) 100 MCG/2ML IJ SOLN: 50.0000 ug | Freq: Once | INTRAMUSCULAR | Status: AC

## 2020-08-11 MED ORDER — LIDOCAINE HCL (PF) 2 % IJ SOLN
INTRAMUSCULAR | Status: AC
  Filled 2020-08-11: qty 4

## 2020-08-11 MED ORDER — BUPIVACAINE HCL (PF) 0.5 % IJ SOLN
INTRAMUSCULAR | Status: DC | PRN
  Administered 2020-08-11: 5 mL via PERINEURAL

## 2020-08-11 MED ORDER — TRANEXAMIC ACID 1000 MG/10ML IV SOLN
INTRAVENOUS | Status: AC
  Filled 2020-08-11: qty 10

## 2020-08-11 MED ORDER — BUPIVACAINE LIPOSOME 1.3 % IJ SUSP
INTRAMUSCULAR | Status: DC | PRN
  Administered 2020-08-11: 15 mL via PERINEURAL

## 2020-08-11 MED ORDER — PANTOPRAZOLE SODIUM 40 MG PO TBEC
40.0000 mg | DELAYED_RELEASE_TABLET | Freq: Every day | ORAL | Status: DC
  Administered 2020-08-12 – 2020-08-13 (×2): 40 mg via ORAL
  Filled 2020-08-11 (×2): qty 1

## 2020-08-11 MED ORDER — TIMOLOL HEMIHYDRATE 0.5 % OP SOLN: 1.0000 [drp] | Freq: Two times a day (BID) | OPHTHALMIC | Status: DC

## 2020-08-11 MED ORDER — PHENYLEPHRINE HCL (PRESSORS) 10 MG/ML IV SOLN
INTRAVENOUS | Status: DC | PRN
  Administered 2020-08-11: 150 ug via INTRAVENOUS

## 2020-08-11 MED ORDER — DOCUSATE SODIUM 100 MG PO CAPS
100.0000 mg | ORAL_CAPSULE | Freq: Two times a day (BID) | ORAL | Status: DC
  Administered 2020-08-11 – 2020-08-13 (×3): 100 mg via ORAL
  Filled 2020-08-11 (×4): qty 1

## 2020-08-11 MED ORDER — PROPOFOL 10 MG/ML IV BOLUS
INTRAVENOUS | Status: AC
  Filled 2020-08-11: qty 60

## 2020-08-11 MED ORDER — ROCURONIUM BROMIDE 100 MG/10ML IV SOLN
INTRAVENOUS | Status: DC | PRN
  Administered 2020-08-11: 50 mg via INTRAVENOUS

## 2020-08-11 MED ORDER — FENTANYL CITRATE (PF) 100 MCG/2ML IJ SOLN
INTRAMUSCULAR | Status: DC | PRN
  Administered 2020-08-11: 50 ug via INTRAVENOUS
  Administered 2020-08-11: 25 ug via INTRAVENOUS

## 2020-08-11 MED ORDER — ONDANSETRON HCL 4 MG/2ML IJ SOLN
INTRAMUSCULAR | Status: DC | PRN
  Administered 2020-08-11: 4 mg via INTRAVENOUS

## 2020-08-11 MED ORDER — ACETAMINOPHEN 325 MG PO TABS: 325.0000 mg | ORAL_TABLET | ORAL | Status: DC | PRN

## 2020-08-11 MED ORDER — ONDANSETRON HCL 4 MG/2ML IJ SOLN
INTRAMUSCULAR | Status: AC
  Filled 2020-08-11: qty 2

## 2020-08-11 MED ORDER — BISACODYL 10 MG RE SUPP: 10.0000 mg | Freq: Every day | RECTAL | Status: DC | PRN

## 2020-08-11 MED ORDER — VASOPRESSIN 20 UNIT/ML IV SOLN
INTRAVENOUS | Status: AC
  Filled 2020-08-11: qty 1

## 2020-08-11 MED ORDER — ENOXAPARIN SODIUM 40 MG/0.4ML IJ SOSY
40.0000 mg | PREFILLED_SYRINGE | INTRAMUSCULAR | Status: DC
  Administered 2020-08-12 – 2020-08-13 (×2): 40 mg via SUBCUTANEOUS
  Filled 2020-08-11 (×2): qty 0.4

## 2020-08-11 MED ORDER — ONDANSETRON HCL 4 MG/2ML IJ SOLN
4.0000 mg | Freq: Four times a day (QID) | INTRAMUSCULAR | Status: DC | PRN
  Administered 2020-08-11: 4 mg via INTRAVENOUS
  Filled 2020-08-11: qty 2

## 2020-08-11 MED ORDER — METFORMIN HCL 500 MG PO TABS
500.0000 mg | ORAL_TABLET | Freq: Every day | ORAL | Status: DC
  Administered 2020-08-12 – 2020-08-13 (×2): 500 mg via ORAL
  Filled 2020-08-11 (×2): qty 1

## 2020-08-11 MED ORDER — METOCLOPRAMIDE HCL 5 MG/ML IJ SOLN: 5.0000 mg | Freq: Three times a day (TID) | INTRAMUSCULAR | Status: DC | PRN

## 2020-08-11 MED ORDER — BUPIVACAINE-EPINEPHRINE (PF) 0.5% -1:200000 IJ SOLN
INTRAMUSCULAR | Status: AC
  Filled 2020-08-11: qty 30

## 2020-08-11 MED ORDER — GLIPIZIDE-METFORMIN HCL 2.5-500 MG PO TABS: 1.0000 | ORAL_TABLET | Freq: Every day | ORAL | Status: DC

## 2020-08-11 MED ORDER — SUGAMMADEX SODIUM 200 MG/2ML IV SOLN
INTRAVENOUS | Status: DC | PRN
  Administered 2020-08-11: 200 mg via INTRAVENOUS

## 2020-08-11 MED ORDER — GLYCOPYRROLATE 0.2 MG/ML IJ SOLN
INTRAMUSCULAR | Status: AC
  Filled 2020-08-11: qty 1

## 2020-08-11 MED ORDER — ONDANSETRON HCL 4 MG/2ML IJ SOLN: 4.0000 mg | Freq: Once | INTRAMUSCULAR | Status: DC | PRN

## 2020-08-11 MED ORDER — SODIUM CHLORIDE FLUSH 0.9 % IV SOLN
INTRAVENOUS | Status: AC
  Filled 2020-08-11: qty 40

## 2020-08-11 MED ORDER — KETOROLAC TROMETHAMINE 15 MG/ML IJ SOLN
15.0000 mg | Freq: Once | INTRAMUSCULAR | Status: AC
  Administered 2020-08-11: 15 mg via INTRAVENOUS

## 2020-08-11 MED ORDER — ONDANSETRON HCL 4 MG PO TABS: 4.0000 mg | ORAL_TABLET | Freq: Four times a day (QID) | ORAL | Status: DC | PRN

## 2020-08-11 MED ORDER — PRAVASTATIN SODIUM 20 MG PO TABS
40.0000 mg | ORAL_TABLET | Freq: Every day | ORAL | Status: DC
  Administered 2020-08-12 – 2020-08-13 (×2): 40 mg via ORAL
  Filled 2020-08-11 (×2): qty 2

## 2020-08-11 MED ORDER — BRIMONIDINE TARTRATE 0.2 % OP SOLN
1.0000 [drp] | Freq: Two times a day (BID) | OPHTHALMIC | Status: DC
  Administered 2020-08-11 – 2020-08-13 (×4): 1 [drp] via OPHTHALMIC
  Filled 2020-08-11: qty 5

## 2020-08-11 MED ORDER — POTASSIUM CHLORIDE IN NACL 20-0.9 MEQ/L-% IV SOLN
INTRAVENOUS | Status: DC
  Filled 2020-08-11 (×6): qty 1000

## 2020-08-11 SURGICAL SUPPLY — 74 items
APL PRP STRL LF DISP 70% ISPRP (MISCELLANEOUS) ×1
BASEPLATE BOSS DRILL (MISCELLANEOUS) ×2 IMPLANT
BIT DRILL 2.5 (BIT) ×2
BIT DRILL 2.5X4.5XSCR (BIT) ×1 IMPLANT
BIT DRILL BASEPLATE CENTRAL S (BIT) ×2 IMPLANT
BIT DRL 2.5X4.5XSCR (BIT) ×1
BLADE SAW SAG 25X90X1.19 (BLADE) ×2 IMPLANT
BNDG COHESIVE 4X5 TAN STRL (GAUZE/BANDAGES/DRESSINGS) ×2 IMPLANT
BSPLAT GLND THK2 22.8X27.3 (Plate) ×1 IMPLANT
CHLORAPREP W/TINT 26 (MISCELLANEOUS) ×2 IMPLANT
COOLER POLAR GLACIER W/PUMP (MISCELLANEOUS) ×2 IMPLANT
COVER BACK TABLE REUSABLE LG (DRAPES) ×2 IMPLANT
COVER WAND RF STERILE (DRAPES) ×2 IMPLANT
DRAPE 3/4 80X56 (DRAPES) ×4 IMPLANT
DRAPE IMP U-DRAPE 54X76 (DRAPES) ×4 IMPLANT
DRAPE INCISE IOBAN 66X45 STRL (DRAPES) ×4 IMPLANT
DRSG OPSITE POSTOP 4X8 (GAUZE/BANDAGES/DRESSINGS) ×2 IMPLANT
ELECT BLADE 6.5 EXT (BLADE) IMPLANT
ELECT CAUTERY BLADE 6.4 (BLADE) IMPLANT
ELECT REM PT RETURN 9FT ADLT (ELECTROSURGICAL) ×2
ELECTRODE REM PT RTRN 9FT ADLT (ELECTROSURGICAL) ×1 IMPLANT
GAUZE XEROFORM 1X8 LF (GAUZE/BANDAGES/DRESSINGS) ×2 IMPLANT
GLENOSPHERE RSS 2 CONCENTRIC (Shoulder) ×2 IMPLANT
GLOVE SRG 8 PF TXTR STRL LF DI (GLOVE) ×2 IMPLANT
GLOVE SURG ENC MOIS LTX SZ7.5 (GLOVE) ×8 IMPLANT
GLOVE SURG ENC MOIS LTX SZ8 (GLOVE) ×8 IMPLANT
GLOVE SURG UNDER LTX SZ8 (GLOVE) ×2 IMPLANT
GLOVE SURG UNDER POLY LF SZ8 (GLOVE) ×4
GOWN STRL REUS W/ TWL LRG LVL3 (GOWN DISPOSABLE) ×1 IMPLANT
GOWN STRL REUS W/ TWL XL LVL3 (GOWN DISPOSABLE) ×3 IMPLANT
GOWN STRL REUS W/TWL LRG LVL3 (GOWN DISPOSABLE) ×2
GOWN STRL REUS W/TWL XL LVL3 (GOWN DISPOSABLE) ×6
GUIDE PIN 2.0 X 150 (WIRE) ×2 IMPLANT
HOOD PEEL AWAY FLYTE STAYCOOL (MISCELLANEOUS) ×8 IMPLANT
ILLUMINATOR WAVEGUIDE N/F (MISCELLANEOUS) IMPLANT
IV NS IRRIG 3000ML ARTHROMATIC (IV SOLUTION) ×2 IMPLANT
KIT STABILIZATION SHOULDER (MISCELLANEOUS) ×2 IMPLANT
KIT TURNOVER KIT A (KITS) ×2 IMPLANT
LINER STD +3S RSS HXL (Liner) ×2 IMPLANT
MANIFOLD NEPTUNE II (INSTRUMENTS) ×2 IMPLANT
MASK FACE SPIDER DISP (MASK) ×2 IMPLANT
MAT ABSORB  FLUID 56X50 GRAY (MISCELLANEOUS) ×2
MAT ABSORB FLUID 56X50 GRAY (MISCELLANEOUS) ×1 IMPLANT
NDL SAFETY ECLIPSE 18X1.5 (NEEDLE) ×1 IMPLANT
NEEDLE HYPO 18GX1.5 SHARP (NEEDLE) ×2
NEEDLE HYPO 22GX1.5 SAFETY (NEEDLE) ×2 IMPLANT
NEEDLE SPNL 20GX3.5 QUINCKE YW (NEEDLE) ×2 IMPLANT
NS IRRIG 500ML POUR BTL (IV SOLUTION) ×2 IMPLANT
PACK ARTHROSCOPY SHOULDER (MISCELLANEOUS) ×2 IMPLANT
PAD ARMBOARD 7.5X6 YLW CONV (MISCELLANEOUS) ×4 IMPLANT
PAD WRAPON POLAR SHDR UNIV (MISCELLANEOUS) ×1 IMPLANT
PENCIL SMOKE EVACUATOR (MISCELLANEOUS) ×4 IMPLANT
PLATE BASE REVERSE RSS S (Plate) ×2 IMPLANT
PULSAVAC PLUS IRRIG FAN TIP (DISPOSABLE) ×2
SCREW 4.5X15 RSS W CAP (Screw) ×4 IMPLANT
SCREW 4.5X20 RSS W CAP (Screw) ×2 IMPLANT
SCREW 4.5X25 RSS W CAP (Screw) ×2 IMPLANT
SCREW 4.5X30 RSS W CAP (Screw) ×2 IMPLANT
SCREW BODY REVERSE LRG (Screw) ×2 IMPLANT
SLING ULTRA II M (MISCELLANEOUS) ×2 IMPLANT
SPONGE LAP 18X18 RF (DISPOSABLE) ×2 IMPLANT
STAPLER SKIN PROX 35W (STAPLE) ×2 IMPLANT
STEM PRESS FIT SZ 08 TSS (Stem) ×2 IMPLANT
SUT ETHIBOND 0 MO6 C/R (SUTURE) ×2 IMPLANT
SUT FIBERWIRE #2 38 BLUE 1/2 (SUTURE) ×8
SUT VIC AB 0 CT1 36 (SUTURE) ×2 IMPLANT
SUT VIC AB 2-0 CT1 27 (SUTURE) ×4
SUT VIC AB 2-0 CT1 TAPERPNT 27 (SUTURE) ×2 IMPLANT
SUTURE FIBERWR #2 38 BLUE 1/2 (SUTURE) ×4 IMPLANT
SYR 10ML LL (SYRINGE) ×2 IMPLANT
SYR 30ML LL (SYRINGE) ×2 IMPLANT
TIP FAN IRRIG PULSAVAC PLUS (DISPOSABLE) ×1 IMPLANT
TOWEL OR 17X26 4PK STRL BLUE (TOWEL DISPOSABLE) IMPLANT
WRAPON POLAR PAD SHDR UNIV (MISCELLANEOUS) ×2

## 2020-08-11 NOTE — Anesthesia Procedure Notes (Signed)
Anesthesia Regional Block: Interscalene brachial plexus block   Pre-Anesthetic Checklist: ,, timeout performed, Correct Patient, Correct Site, Correct Laterality, Correct Procedure, Correct Position, site marked, Risks and benefits discussed,  Surgical consent,  Pre-op evaluation,  At surgeon's request and post-op pain management  Laterality: Upper and Left  Prep: chloraprep       Needles:  Injection technique: Single-shot  Needle Type: Echogenic Stimulator Needle     Needle Length: 10cm  Needle Gauge: 21   Needle insertion depth: 7 cm   Additional Needles:   Procedures: Doppler guided,,,, ultrasound used (permanent image in chart),,,,  Motor weakness within 4 minutes.  Narrative:  Start time: 08/11/2020 7:24 AM End time: 08/11/2020 7:26 AM Injection made incrementally with aspirations every 5 mL.  Performed by: Personally  Anesthesiologist: Christia Reading, MD  Additional Notes: Functioning IV was confirmed and O2 Dalton/monitors were applied. Light sedation administered as required, patient responsive throughout. A 21ga EchoStim needle was used. Sterile prep and drape,hand hygiene and sterile gloves were used.  Negative aspiration and negative test dose prior to incremental administration of local anesthetic. 1% Lidocaine for skin wheal, 3 ml. Total LA: 54ml - Exparel 15 ml & 0.5% Bupivicaine 5 ml. U/S images stored in chart. The patient tolerated the procedure well.

## 2020-08-11 NOTE — TOC Progression Note (Signed)
Transition of Care [facility]) - Progression Note    Patient Details  Name: Kristie Hoover MRN: [id number] Date of Birth: 1950-11-26  [facility] CM/SW Contact  Caryn Section, RN Phone Number: 08/11/2020, 3:48 PM  Clinical Narrative:   Patient lives at home by herself, states nephew sometimes assists her, as many family members work during the day.  She has no concerns getting to appointments or getting medications at this time.  TOC contact information given, TOC to follow to discharge.         Expected Discharge Plan and Services                                                 Social Determinants of Health (SDOH) Interventions    Readmission Risk Interventions No flowsheet data found.

## 2020-08-11 NOTE — Transfer of Care (Signed)
Immediate Anesthesia Transfer of Care Note  Patient: Kristie Hoover  Procedure(s) Performed: REVERSE SHOULDER ARTHROPLASTY (Left Shoulder)  Patient Location: PACU  Anesthesia Type:GA combined with regional for post-op pain  Level of Consciousness: awake, drowsy and patient cooperative  Airway & Oxygen Therapy: Patient Spontanous Breathing and Patient connected to face mask oxygen  Post-op Assessment: Report given to RN and Post -op Vital signs reviewed and stable  Post vital signs: Reviewed and stable  Last Vitals:  Vitals Value Taken Time  BP 147/81 08/11/20 1021  Temp    Pulse 59 08/11/20 1025  Resp 15 08/11/20 1025  SpO2 100 % 08/11/20 1025  Vitals shown include unvalidated device data.  Last Pain:  Vitals:   08/11/20 0638  TempSrc: Temporal  PainSc: 0-No pain      Patients Stated Pain Goal: 1 (08/04/20 0836)  Complications: No complications documented.

## 2020-08-11 NOTE — OR Nursing (Signed)
Patent arrives to sds; drove herself to the hospital for the procedure today.  Patient states she will "drive myself home, when its time".  Patient refused to give the name and phone number of a contact to use for emergencies.  Patient states she has a nephew that is vacationing in [place] at this time.  Patient states she lives alone and has no need "for someone to help me at home after surgery. I take care of myself any other time." Dr. Joice Lofts aware of patients circumstance for discharge from the hospital.  He will seek arrangements with the care management team at the time patient is to be discharged home.  Patient states her nephews name is "Hope Less"; she was unable to give me a contact number at this time.

## 2020-08-11 NOTE — Evaluation (Signed)
Physical Therapy Evaluation Patient Details Name: Kristie Hoover MRN: [id number] DOB: [birth year] Today's Date: 08/11/2020   History of Present Illness  Patient is a 70 year old female with massive irreparable rotator cuff tear with early cuff arthropathy, left shoulder. Patient elected for a left reverse total shoulder arthroplasty completed 08/11/20.    Clinical Impression  Patient standing up in the bathroom with staff on arrival to room. She is agreeable to PT evaluation. Patient previously independent with mobility and lives at home alone. Patient was able to maintain NWB of LUE with functional activity with abduction sling in place. Easily distracted and needs frequent cues for attention to task. Patient needs occasional cues for increased safety awareness and to protect LUE when navigating obstacles and through doorways. Overall patient is demonstrating high level of functional mobility and is ambulating hallways with Min guard-supervision without assistive device. Recommend PT follow up to maximize independence and increase safety awareness in preparation for home discharge.      Follow Up Recommendations Home health PT    Equipment Recommendations  None recommended by PT    Recommendations for Other Services       Precautions / Restrictions Precautions Precautions: Fall;Shoulder Shoulder Interventions: Shoulder abduction pillow Required Braces or Orthoses: Sling (abduction brace in place LUE) Restrictions Weight Bearing Restrictions: Yes LUE Weight Bearing: Non weight bearing      Mobility  Bed Mobility               General bed mobility comments: not assessed. patient was standing in the bathroom with nurse aide present on arrival to room    Transfers Overall transfer level: Needs assistance   Transfers: Sit to/from Stand Sit to Stand: Supervision         General transfer comment: supervision for safety. patient was able to maintain NWB of LUE with  transfers  Ambulation/Gait Ambulation/Gait assistance: Min guard;Supervision Gait Distance (Feet): 250 Feet Assistive device: None Gait Pattern/deviations: Step-through pattern Gait velocity: decreased   General Gait Details: patient ambulated around the unit and in the hallways with supervision and occasional Min guard. patient with decreased safety awareness at times for awareness of LUE while navigating obstacles and through doorways. Patient nearly hits the doorway x 2 times with LUE/sling and requires cues for safety awareness  Stairs            Wheelchair Mobility    Modified Rankin (Stroke Patients Only)       Balance Overall balance assessment: Needs assistance Sitting-balance support: Feet supported;No upper extremity supported Sitting balance-Leahy Scale: Good     Standing balance support: No upper extremity supported Standing balance-Leahy Scale: Good Standing balance comment: no loss of balance with functional standing activity                             Pertinent Vitals/Pain Pain Assessment: No/denies pain    Home Living Family/patient expects to be discharged to:: Private residence Living Arrangements: Alone Available Help at Discharge: Family;Available PRN/intermittently Type of Home: Apartment Home Access: Level entry (patient states she has one step to enter and then states she has a level entry?)     Home Layout: One level        Prior Function Level of Independence: Independent               Hand Dominance        Extremity/Trunk Assessment   Upper Extremity Assessment Upper Extremity Assessment: LUE  deficits/detail LUE Deficits / Details: left arm in shoulder abduction brace. LUE: Unable to fully assess due to immobilization LUE Sensation:  (patient reports her arm feels numb)    Lower Extremity Assessment Lower Extremity Assessment: Overall WFL for tasks assessed       Communication   Communication: No  difficulties  Cognition Arousal/Alertness: Awake/alert Behavior During Therapy: WFL for tasks assessed/performed;Impulsive (impulsive at times with mobility efforts) Overall Cognitive Status: Within Functional Limits for tasks assessed                                 General Comments: patient is easily distracted and needs frequent cues for re-direction and attention to task. grossly oriented and able to follow single step commands consistently. decreased safety awareness at times      General Comments      Exercises     Assessment/Plan    PT Assessment Patient needs continued PT services  PT Problem List Decreased strength;Decreased range of motion;Decreased activity tolerance;Decreased balance;Decreased mobility;Decreased safety awareness;Decreased knowledge of precautions       PT Treatment Interventions DME instruction;Gait training;Stair training;Functional mobility training;Therapeutic activities;Therapeutic exercise;Balance training;Neuromuscular re-education    PT Goals (Current goals can be found in the Care Plan section)  Acute Rehab PT Goals Patient Stated Goal: to go home PT Goal Formulation: With patient Time For Goal Achievement: 08/25/20 Potential to Achieve Goals: Good    Frequency 7X/week   Barriers to discharge        Co-evaluation               AM-PAC PT "6 Clicks" Mobility  Outcome Measure Help needed turning from your back to your side while in a flat bed without using bedrails?: None Help needed moving from lying on your back to sitting on the side of a flat bed without using bedrails?: A Little Help needed moving to and from a bed to a chair (including a wheelchair)?: A Little Help needed standing up from a chair using your arms (e.g., wheelchair or bedside chair)?: A Little Help needed to walk in hospital room?: A Little Help needed climbing 3-5 steps with a railing? : A Little 6 Click Score: 19    End of Session Equipment  Utilized During Treatment:  (left UE sling) Activity Tolerance: Patient tolerated treatment well;No increased pain Patient left:  (sitting on edge of bed with OT present)   PT Visit Diagnosis: Unsteadiness on feet (R26.81);Other abnormalities of gait and mobility (R26.89)    Time: 6767-2094 PT Time Calculation (min) (ACUTE ONLY): 15 min   Charges:   PT Evaluation $PT Eval Moderate Complexity: 1 Mod          Donna Bernard, PT, MPT   Ina Homes 08/11/2020, 3:59 PM

## 2020-08-11 NOTE — Op Note (Signed)
08/11/2020  10:33 AM  Patient:   Kristie Hoover  Pre-Op Diagnosis:   Massive irreparable rotator cuff tear with early cuff arthropathy, left shoulder.  Post-Op Diagnosis:   Same.  Procedure:   Reverse left total shoulder arthroplasty.  Surgeon:   Maryagnes Amos, MD  Assistant:   Griffin Basil, RNFA; Odelia Gage, PA-S  Anesthesia:   General endotracheal with an interscalene block using Exparel placed preoperatively by the anesthesiologist.  Findings:   As above.  Complications:   None  EBL:   100 cc  Fluids:   800 cc crystalloid  UOP:   None  TT:   None  Drains:   None  Closure:   Staples  Implants:   All press-fit Integra system with a 8 mm stem, a large metaphyseal body, a +3 mm humeral platform, a mini baseplate, and a 38 mm concentric 2 mm laterally offset glenosphere.  Brief Clinical Note:   The patient is a 70 year old female with a long history of progressively worsening left shoulder pain and weakness. Her symptoms have progressed despite medications, activity modification, etc. Her history and examination are consistent with a rotator cuff tear and cuff arthropathy, all of which were confirmed by a preoperative MRI scan. The patient presents at this time for a reverse left total shoulder arthroplasty.  Procedure:   The patient underwent placement of an interscalene block using Exparel by the anesthesiologist in the preoperative holding area before being brought into the operating room and lain in the supine position. The patient then underwent general endotracheal intubation and anesthesia before the patient was repositioned in the beach chair position using the beach chair positioner. The left shoulder and upper extremity were prepped with ChloraPrep solution before being draped sterilely. Preoperative antibiotics were administered.   A timeout was performed to verify the appropriate surgical site before a standard anterior approach to the shoulder was made through  an approximately 4-5 inch incision. The incision was carried down through the subcutaneous tissues to expose the deltopectoral fascia. The interval between the deltoid and pectoralis muscles was identified and this plane developed, retracting the cephalic vein laterally with the deltoid muscle. The conjoined tendon was identified. Its lateral margin was dissected and the Kolbel self-retraining retractor inserted. The "three sisters" were identified and cauterized. Bursal tissues were removed to improve visualization. The subscapularis tendon was released from its attachment to the lesser tuberosity 1 cm proximal to its insertion and several tagging sutures placed. The inferior capsule was released with care after identifying and protecting the axillary nerve. The proximal humeral cut was made at approximately 20 of retroversion using the extra-medullary guide.   Attention was redirected to the glenoid. The labrum was debrided circumferentially before the center of the glenoid was identified. The guidewire was drilled into the glenoid neck using the appropriate guide. After verifying its position, it was overreamed with the mini-baseplate reamer to create a flat surface before the stem reamer was utilized. The superior and inferior peg sites were reamed using the appropriate guide to complete the glenoid preparation. The permanent mini-baseplate was impacted into place. It was stabilized with a 25 x 4.5 mm central screw and four peripheral screws. Locking caps were placed over the superior and inferior screws. The permanent 38 mm concentric glenosphere with 2 mm of lateral offset was then impacted into place and its Morse taper locking mechanism verified using manual distraction.  Attention was directed to the humeral side. The humeral canal was prepared utilizing the tapered stem  reamers sequentially beginning with the 6 mm stem and progressing to an 8 mm stem. This demonstrated a good tight fit. The  metaphyseal region was then prepared using the appropriate planar device. The trial stem and medium metaphyseal body were put together on the back table and a trial reduction performed using the +0 mm and +6 mm inserts. With the +6 mm insert, the arm was still somewhat loose. Therefore, the trial components were removed and a repeat trial reduction was performed using the large metaphyseal body and the 8 mm trial stem with the +3 mm insert. With this combination, the arm demonstrated excellent range of motion as the hand could be brought across the chest to the opposite shoulder and brought to the top of the patient's head and to the patient's ear. The shoulder appeared stable throughout this range of motion.   The joint was dislocated and the trial components removed. The permanent 8 mm stem with the large body was impacted into place with care taken to maintain the appropriate version. A repeat trial reduction with the +3 mm insert again demonstrated excellent stability with the findings as described above. Therefore, the shoulder was re-dislocated and, after inserting the locking screw to secure the body to the stem, the permanent +3 mm insert impacted into place. After verifying its locking mechanism, the shoulder was relocated using two finger pressure and again placed through a range of motion with the findings as described above.  The wound was copiously irrigated with sterile saline solution using the jet lavage system before a total of 30 cc of 0.5% Sensorcaine with epinephrine was injected into the pericapsular and peri-incisional tissues to help with postoperative analgesia. The subscapularis tendon was reapproximated using #2 FiberWire interrupted sutures. The deltopectoral interval was closed using #0 Vicryl interrupted sutures before the subcutaneous tissues were closed using 2-0 Vicryl interrupted sutures. The skin was closed using staples. Prior to closing the skin, 1 g of transexemic acid in 10  cc of normal saline was injected intra-articularly to help with postoperative bleeding. A sterile occlusive dressing was applied to the wound before the arm was placed into a shoulder immobilizer with an abduction pillow. A Polar Care system also was applied to the shoulder. The patient was then transferred back to a hospital bed before being awakened, extubated, and returned to the recovery room in satisfactory condition after tolerating the procedure well.

## 2020-08-11 NOTE — Anesthesia Postprocedure Evaluation (Signed)
Anesthesia Post Note  Patient: Kristie Hoover  Procedure(s) Performed: REVERSE SHOULDER ARTHROPLASTY (Left Shoulder)  Patient location during evaluation: PACU Anesthesia Type: General Level of consciousness: awake and alert Pain management: pain level controlled (Good block result) Vital Signs Assessment: post-procedure vital signs reviewed and stable Respiratory status: spontaneous breathing, nonlabored ventilation and respiratory function stable Cardiovascular status: blood pressure returned to baseline and stable Postop Assessment: no apparent nausea or vomiting Anesthetic complications: no   No complications documented.   Last Vitals:  Vitals:   08/11/20 1046 08/11/20 1106  BP: 131/75 137/76  Pulse: 62 (!) 58  Resp: 16 16  Temp: (!) 36.1 C 36.6 C  SpO2: 96% 95%    Last Pain:  Vitals:   08/11/20 1115  TempSrc:   PainSc: 0-No pain                 Christia Reading

## 2020-08-11 NOTE — Evaluation (Signed)
Occupational Therapy Evaluation Patient Details Name: Kristie Hoover MRN: [id number] DOB: 12-23-1950 Today's Date: 08/11/2020    History of Present Illness Patient is a 70 year old female with massive irreparable rotator cuff tear with early cuff arthropathy, left shoulder. Patient elected for a left reverse total shoulder arthroplasty completed 08/11/20.   Clinical Impression   [name] was seen for OT evaluation this date. Prior to hospital admission, pt was Independent for mobility and I/ADLs. Pt lives alone with nephew nearby, however states he would not be able to check on her frequently as he works full time. Pt presents to acute OT demonstrating impaired ADL performance and functional mobility 2/2 functional ROM/balance deficits, poor insight into deficits, and impaired functional use of non-dominant LUE. End of session pt reports nausea, RN notified and in room to assess.   Pt currently requires MAX A don/doff polar care seated EOB. MOD A + MOD cues to don/doff sling immobilizer seated EOB - pt states "I'm going to have to hire someone to do this for me at home" then when asked if she plans to pt states no. SUPERVISION for ADL t/f - pt noted to bump L arm into walls, doors, etc. Pt states she completed dressing with ~25% assist however required ~75% assist this date. Pt is unsafe to return home without caregiver assistance.  Pt instructed in polar care mgt, sling/immobilizer mgt, LUE precautions, adaptive strategies for bathing/dressing/toileting/grooming, positioning and considerations for sleep, and home/routines modifications. Handout provided. Pt verbalized understanding of all education/training provided. Pt would benefit from skilled OT to address noted impairments and functional limitations (see below for any additional details) in order to maximize safety and independence while minimizing falls risk and caregiver burden. Upon hospital discharge, recommend HHOT c Supervision to maximize  pt safety and return to functional independence during meaningful occupations of daily life. Of note, pt reports she does not plan on having any assistance at home.        Follow Up Recommendations  Home health OT;Supervision - Intermittent    Equipment Recommendations  None recommended by OT    Recommendations for Other Services       Precautions / Restrictions Precautions Precautions: Fall;Shoulder Shoulder Interventions: Shoulder abduction pillow Required Braces or Orthoses: Sling Restrictions Weight Bearing Restrictions: Yes LUE Weight Bearing: Non weight bearing      Mobility Bed Mobility               General bed mobility comments: not assessed, pt received standing c PT, left in chair    Transfers Overall transfer level: Needs assistance Equipment used: None Transfers: Sit to/from Stand Sit to Stand: Supervision         General transfer comment: sup for safety    Balance Overall balance assessment: Needs assistance Sitting-balance support: Feet supported;No upper extremity supported Sitting balance-Leahy Scale: Good     Standing balance support: No upper extremity supported Standing balance-Leahy Scale: Good Standing balance comment: poor awarenes of LUE                           ADL either performed or assessed with clinical judgement   ADL Overall ADL's : Needs assistance/impaired                                       General ADL Comments: MAX A don/doff polar care seated EOB.  MOD A + MOD cues to don/doff sling immobilizer seated EOB - pt states "I'm going to have to hire someone to do this for me at home" then when asked if she plans to pt states no. SUPERVISION for ADL t/f - pt noted to bump L arm into walls, doors, etc.                  Pertinent Vitals/Pain Pain Assessment: No/denies pain     Hand Dominance Right   Extremity/Trunk Assessment Upper Extremity Assessment Upper Extremity Assessment: LUE  deficits/detail LUE Deficits / Details: left arm in shoulder abduction brace. LUE: Unable to fully assess due to pain;Unable to fully assess due to immobilization LUE Sensation:  (patient reports her arm feels numb)   Lower Extremity Assessment Lower Extremity Assessment: Overall WFL for tasks assessed       Communication Communication Communication: No difficulties   Cognition Arousal/Alertness: Awake/alert Behavior During Therapy: WFL for tasks assessed/performed;Impulsive (impulsive and requires redirection) Overall Cognitive Status: Within Functional Limits for tasks assessed                                 General Comments: poor insight into deficits, states she completed dressing with ~25% assist however required ~75% assist.   General Comments       Exercises Exercises: Other exercises Other Exercises Other Exercises: Pt educated re: OT role, DME recs, d/c recs, falls prevnetion, ECS, sling mgmt, dressing techniques, polar care Other Exercises: Don/doff sling/polar care, sit<>stand, sitting/standing balance/tolerance   Shoulder Instructions      Home Living Family/patient expects to be discharged to:: Private residence Living Arrangements: Alone Available Help at Discharge: Family;Available PRN/intermittently Type of Home: Apartment Home Access: Level entry     Home Layout: One level                          Prior Functioning/Environment Level of Independence: Independent                 OT Problem List: Decreased activity tolerance;Decreased range of motion;Impaired balance (sitting and/or standing);Decreased safety awareness;Decreased knowledge of use of DME or AE;Decreased knowledge of precautions;Impaired UE functional use      OT Treatment/Interventions: Self-care/ADL training;Therapeutic exercise;Energy conservation;DME and/or AE instruction;Therapeutic activities;Patient/family education;Balance training    OT Goals(Current  goals can be found in the care plan section) Acute Rehab OT Goals Patient Stated Goal: to go home OT Goal Formulation: With patient Time For Goal Achievement: 08/25/20 Potential to Achieve Goals: Fair ADL Goals Pt Will Perform Grooming: standing;Independently Pt Will Perform Upper Body Bathing: with min guard assist Pt Will Perform Upper Body Dressing: with min assist;sitting Pt Will Perform Toileting - Clothing Manipulation and hygiene: with modified independence;sitting/lateral leans  OT Frequency: Min 2X/week   Barriers to D/C: Decreased caregiver support             AM-PAC OT "6 Clicks" Daily Activity     Outcome Measure Help from another person eating meals?: A Little Help from another person taking care of personal grooming?: A Little Help from another person toileting, which includes using toliet, bedpan, or urinal?: A Little Help from another person bathing (including washing, rinsing, drying)?: A Little Help from another person to put on and taking off regular upper body clothing?: A Lot Help from another person to put on and taking off regular lower body clothing?: A Little 6  Click Score: 17   End of Session Nurse Communication: Mobility status  Activity Tolerance: Patient tolerated treatment well Patient left: in chair;with call bell/phone within reach;with nursing/sitter in room  OT Visit Diagnosis: Other abnormalities of gait and mobility (R26.89)                Time: 5809-9833 OT Time Calculation (min): 73 min Charges:  OT General Charges $OT Visit: 1 Visit OT Evaluation $OT Eval Moderate Complexity: 1 Mod OT Treatments $Self Care/Home Management : 53-67 mins   Kathie Dike, M.S. OTR/L  08/11/20, 5:02 PM  ascom [phone]

## 2020-08-11 NOTE — Anesthesia Preprocedure Evaluation (Addendum)
Anesthesia Evaluation  Patient identified by MRN, date of birth, ID band Patient awake    Reviewed: Allergy & Precautions, H&P , NPO status , reviewed documented beta blocker date and time   Airway Mallampati: II  TM Distance: >3 FB Neck ROM: full    Dental  (+) Upper Dentures, Poor Dentition, Chipped, Missing   Pulmonary asthma ,    Pulmonary exam normal        Cardiovascular hypertension, Normal cardiovascular exam     Neuro/Psych    GI/Hepatic hiatal hernia, GERD  Medicated and Controlled,  Endo/Other  diabetes  Renal/GU      Musculoskeletal  (+) Arthritis ,   Abdominal   Peds  Hematology   Anesthesia Other Findings Past Medical History: No date: Arthritis No date: Asthma     Comment:  WELL CONTROLLED No date: Diabetes mellitus without complication (HCC) No date: GERD (gastroesophageal reflux disease) No date: History of hiatal hernia No date: Hypertension     Comment:  NO MEDS Past Surgical History: No date: ABDOMINAL HYSTERECTOMY No date: APPENDECTOMY No date: CARPAL TUNNEL RELEASE; Right No date: COLONOSCOPY 08/19/2017: COLONOSCOPY WITH PROPOFOL; N/A     Comment:  Procedure: COLONOSCOPY WITH PROPOFOL;  Surgeon: Scot Jun, MD;  Location: [facility] ENDOSCOPY;  Service:               Endoscopy;  Laterality: N/A; 03/31/2020: CYSTOCELE REPAIR; N/A     Comment:  Procedure: ANTERIOR REPAIR (CYSTOCELE);  Surgeon:               Nadara Mustard, MD;  Location: [facility] ORS;  Service:               Gynecology;  Laterality: N/A; No date: ESOPHAGOGASTRODUODENOSCOPY 08/19/2017: ESOPHAGOGASTRODUODENOSCOPY (EGD) WITH PROPOFOL; N/A     Comment:  Procedure: ESOPHAGOGASTRODUODENOSCOPY (EGD) WITH               PROPOFOL;  Surgeon: Scot Jun, MD;  Location:               [facility] ENDOSCOPY;  Service: Endoscopy;  Laterality: N/A; 1998: LAMINECTOMY     Comment:  NO METAL 03/31/2020: RECTOCELE REPAIR; N/A      Comment:  Procedure: POSTERIOR REPAIR (RECTOCELE);  Surgeon:               Nadara Mustard, MD;  Location: [facility] ORS;  Service:               Gynecology;  Laterality: N/A; 1993: WRIST FUSION; Right     Comment:  METAL PLATE AND SCREWS HAVE BEEN REMOVED BMI    Body Mass Index: 27.99 kg/m     Reproductive/Obstetrics                            Anesthesia Physical Anesthesia Plan  ASA: III  Anesthesia Plan: General   Post-op Pain Management:  Regional for Post-op pain   Induction: Intravenous  PONV Risk Score and Plan: Ondansetron, Midazolam and Treatment may vary due to age or medical condition  Airway Management Planned: Oral ETT  Additional Equipment:   Intra-op Plan:   Post-operative Plan: Extubation in OR  Informed Consent: I have reviewed the patients History and Physical, chart, labs and discussed the procedure including the risks, benefits and alternatives for the proposed anesthesia with the patient or authorized representative who has indicated his/her understanding  and acceptance.     Dental Advisory Given  Plan Discussed with: CRNA  Anesthesia Plan Comments: (Discussed GOT with interscalene for post op pain control. Pt accepts and wishes to procede)       Anesthesia Quick Evaluation

## 2020-08-11 NOTE — H&P (Signed)
History of Present Illness:  Kristie Hoover is a 70 y.o. female who presents today as a result of a referral by Dr. Velora Mediate for left shoulder pain and weakness.   The patient's symptoms began at least 4 to 6 months ago and developed without any specific cause or injury. She saw Cranston Neighbor, PA-C, who ordered an MRI scan and referred her to me for further evaluation and treatment. The patient describes the symptoms as marked (major pain with significant limitations) and have the quality of being aching, miserable, nagging, stabbing and throbbing. The pain is localized to the lateral arm/shoulder and localized to the anterior shoulder. These symptoms are aggravated constantly, with normal daily activities and with sleeping. She has tried narcotics and muscle relaxants with limited benefit. She has tried rest with no significant benefit. She has not tried any formal physical therapy or steroid injections. The patient notes that the left shoulder pain will sometimes radiate into the left side of her neck, but denies any true neck pain, nor does she note any numbness or paresthesias down her arm to her hand. She is right-handed.  This complaint is not work related. She is a sports non-participant.  Shoulder Surgical History:  The patient has had no shoulder surgery in the past.  PMH/PSH/Family History/Social History/Meds/Allergies:  I have reviewed past medical, surgical, social and family history, medications and allergies as documented in the EMR.  Current Outpatient Medications: . ADVAIR DISKUS 100-50 mcg/dose diskus inhaler  . aspirin 81 MG EC tablet Take 81 mg by mouth once daily  . cyclobenzaprine (FLEXERIL) 10 MG tablet  . etodolac (LODINE) 400 MG tablet  . FUROsemide (LASIX) 40 MG tablet  . glipiZIDE-metFORMIN (METAGLIP) 2.5-500 mg tablet  . montelukast (SINGULAIR) 10 mg tablet  . pantoprazole (PROTONIX) 40 MG DR tablet  . potassium chloride (K-DUR,KLOR-CON) 20 MEQ ER tablet  .  pravastatin (PRAVACHOL) 40 MG tablet   No current Epic-ordered facility-administered medications on file.   Allergies:  . Prednisone Hypertension  . Tramadol Hallucination  . Erythromycin Rash   Past Medical History:  . Arthritis  . Asthma  . GERD (gastroesophageal reflux disease) 03/01/2017  . Hypertension   Past Surgical History:  . APPENDECTOMY 1983  . COLONOSCOPY 11/22/2006 (Int Hemorrhoids, Diverticulosis: CBF 11/2016)  . COLONOSCOPY 08/19/2017 (Diverticulosis, Internal hemorrhoids: CBF 08/2027)  . EGD 11/22/2006 (No repeat per RTE)  . EGD 08/19/2017 (Normal: No repeat per RTE)  . ENDOSCOPIC CARPAL TUNNEL RELEASE  . HYSTERECTOMY VAGINAL 12/1981  . LAMINECTOMY LUMBAR SPINE 1998  . WRIST FUSION 1992   Family History:  . Stroke Mother  . Lupus Sister  . Cardiomyopathy (Abnormal function of the heart muscle) Sister  . Breast cancer Other  . Diabetes Sister  . Alzheimer's disease Sister   Social History:   Socioeconomic History:  Marland Kitchen Marital status: Single  Tobacco Use  . Smoking status: Never Smoker  . Smokeless tobacco: Never Used  Substance and Sexual Activity  . Alcohol use: Never  . Drug use: Never   Review of Systems:  A comprehensive 14 point ROS was performed, reviewed, and the pertinent orthopaedic findings are documented in the HPI.  Physical Exam:  Vitals:  07/18/20 0941  BP: 126/88  Weight: 84.6 kg (186 lb 6.4 oz)  Height: 170.2 cm (5\' 7" )  PainSc: 6  PainLoc: Shoulder   General/Constitutional: The patient appears to be well-nourished, well-developed, and in no acute distress. Neuro/Psych: Normal mood and affect, oriented to person, place and time. Eyes: Non-icteric.  Pupils are equal, round, and reactive to light, and exhibit synchronous movement. ENT: Unremarkable. Lymphatic: No palpable adenopathy. Respiratory: Lungs clear to auscultation, Normal chest excursion, No wheezes and Non-labored breathing Cardiovascular: Regular rate and rhythm. No  murmurs. and No edema, swelling or tenderness, except as noted in detailed exam. Integumentary: No impressive skin lesions present, except as noted in detailed exam. Musculoskeletal: Unremarkable, except as noted in detailed exam.  Left shoulder exam: SKIN: normal SWELLING: none WARMTH: none LYMPH NODES: no adenopathy palpable CREPITUS: none TENDERNESS: Mildly tender over anterolateral shoulder ROM (active):  Forward flexion: 145 degrees Abduction: 130 degrees Internal rotation: Left PSIS ROM (passive):  Forward flexion: 155 degrees Abduction: 145 degrees ER/IR at 90 abd: 90 degrees / 55 degrees  She has mild-moderate pain at the extremes of all motions.  STRENGTH: Forward flexion: 4/5 Abduction: 4/5 External rotation:4-4/5 Internal rotation: 4+/5 Pain with RC testing: Mild pain with resisted forward flexion and abduction.  STABILITY: Normal  SPECIAL TESTS: Juanetta Gosling' test: positive, moderate Speed's test: Not evaluated Capsulitis - pain w/ passive ER: no Crossed arm test: Minimally positive Crank: Not evaluated Anterior apprehension: Negative Posterior apprehension: Not evaluated  She is neurovascularly intact to the left upper extremity.  Imaging:  Shoulder X-Ray Imaging: True AP, Y-scapular, and axillary views of the left shoulder are available for review and have been reviewed by myself. These films demonstrate mild degenerative changes as manifest by mild joint space narrowing and early osteophyte formation. The subacromial space is markedly decreased. There is no subacromial or infra-clavicular spurring. She demonstrates a Type II acromion.  Shoulder Imaging, MRI: Left Shoulder: MRI Shoulder Cartilage: Partial thickness humeral head cartilage loss. Partial thickness glenoid cartilage loss. MRI Shoulder Rotator Cuff: Full thickness tear of the supraspinatus and infraspinatus tendons. Retracted to the glenohumeral joint. Moderate atrophy of supraspinatus muscle and  mild atrophy of infraspinatus muscle. MRI Shoulder Labrum / Biceps: Biceps tendon chronically torn and retracted. Degenerative labral changes superiorly MRI Shoulder Bone: Normal bone.  Both the films and report have been reviewed by myself and discussed with the patient.  Assessment:  1. Rotator cuff arthropathy, left. 2. Nontraumatic complete tear of left rotator cuff.   Plan:  The treatment options were discussed with the patient. In addition, patient educational materials were provided regarding the diagnosis and treatment options. The patient is quite frustrated by her symptoms and functional limitations, and is ready to consider more aggressive treatment options. Therefore, I have recommended a surgical procedure, specifically a reverse left total shoulder arthroplasty. The procedure was discussed with the patient, as were the potential risks (including bleeding, infection, nerve and/or blood vessel injury, persistent or recurrent pain, loosening and/or failure of the components, dislocation, need for further surgery, blood clots, strokes, heart attacks and/or arhythmias, pneumonia, etc.) and benefits. The patient states her understanding and wishes to proceed. All of the patient's questions and concerns were answered. She can call any time with further concerns. She will follow up post-surgery, routine.   H&P reviewed and patient re-examined. No changes.

## 2020-08-11 NOTE — Anesthesia Procedure Notes (Signed)
Procedure Name: Intubation Date/Time: 08/11/2020 7:41 AM Performed by: Henrietta Hoover, CRNA Pre-anesthesia Checklist: Patient identified, Emergency Drugs available, Suction available and Patient being monitored Patient Re-evaluated:Patient Re-evaluated prior to induction Oxygen Delivery Method: Circle system utilized Preoxygenation: Pre-oxygenation with 100% oxygen Induction Type: IV induction and Cricoid Pressure applied Ventilation: Mask ventilation without difficulty Laryngoscope Size: 3 and McGraph Grade View: Grade I Tube type: Oral Tube size: 7.0 mm Number of attempts: 1 Airway Equipment and Method: Stylet and Video-laryngoscopy Placement Confirmation: ETT inserted through vocal cords under direct vision,  positive ETCO2 and breath sounds checked- equal and bilateral Secured at: 21 cm Tube secured with: Tape Dental Injury: Teeth and Oropharynx as per pre-operative assessment

## 2020-08-12 ENCOUNTER — Encounter: Payer: Self-pay | Admitting: Surgery

## 2020-08-12 DIAGNOSIS — M19012 Primary osteoarthritis, left shoulder: Secondary | ICD-10-CM | POA: Diagnosis not present

## 2020-08-12 DIAGNOSIS — Z96612 Presence of left artificial shoulder joint: Secondary | ICD-10-CM | POA: Insufficient documentation

## 2020-08-12 LAB — BASIC METABOLIC PANEL
Anion gap: 11 (ref 5–15)
BUN: 10 mg/dL (ref 8–23)
CO2: 21 mmol/L — ABNORMAL LOW (ref 22–32)
Calcium: 9.1 mg/dL (ref 8.9–10.3)
Chloride: 107 mmol/L (ref 98–111)
Creatinine, Ser: 0.74 mg/dL (ref 0.44–1.00)
GFR, Estimated: >60 mL/min (ref >60)
Glucose, Bld: 134 mg/dL — ABNORMAL HIGH (ref 70–99)
Potassium: 3.5 mmol/L (ref 3.5–5.1)
Sodium: 139 mmol/L (ref 135–145)

## 2020-08-12 LAB — GLUCOSE, CAPILLARY
Glucose-Capillary: 99 mg/dL (ref 70–99)
Glucose-Capillary: 113 mg/dL — ABNORMAL HIGH (ref 70–99)
Glucose-Capillary: 119 mg/dL — ABNORMAL HIGH (ref 70–99)

## 2020-08-12 LAB — CBC
HCT: 38.5 % (ref 36.0–46.0)
Hemoglobin: 12.1 g/dL (ref 12.0–15.0)
MCH: 29.8 pg (ref 26.0–34.0)
MCHC: 31.4 g/dL (ref 30.0–36.0)
MCV: 94.8 fL (ref 80.0–100.0)
Platelets: 165 10*3/uL (ref 150–400)
RBC: 4.06 MIL/uL (ref 3.87–5.11)
RDW: 14.2 % (ref 11.5–15.5)
WBC: 10.8 10*3/uL — ABNORMAL HIGH (ref 4.0–10.5)
nRBC: 0 % (ref 0.0–0.2)

## 2020-08-12 MED ORDER — ASPIRIN EC 325 MG PO TBEC: 325.0000 mg | DELAYED_RELEASE_TABLET | Freq: Every day | ORAL | 0 refills | Status: AC

## 2020-08-12 MED ORDER — OXYCODONE-ACETAMINOPHEN 5-325 MG PO TABS: 0.5000 | ORAL_TABLET | Freq: Four times a day (QID) | ORAL | 0 refills | Status: DC | PRN

## 2020-08-12 NOTE — Care Management Obs Status (Signed)
MEDICARE OBSERVATION STATUS NOTIFICATION   Patient Details  Name: Kristie Hoover MRN: [id number] Date of Birth: 06-Apr-1950   Medicare Observation Status Notification Given:  Yes    Caryn Section, RN 08/12/2020, 11:29 AM

## 2020-08-12 NOTE — Discharge Summary (Addendum)
Physician Discharge Summary  Patient ID: Kristie Hoover MRN: [id number] DOB/AGE: 09/16/1950 70 y.o.  Admit date: 08/11/2020 Discharge date:08/13/2020 Admission Diagnoses:  Status post reverse arthroplasty of shoulder, left [Z96.612]   Discharge Diagnoses: Patient Active Problem List   Diagnosis Date Noted  . Status post reverse arthroplasty of shoulder, 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  . S/P anterior colporrhaphy 04/18/2020  . Rectocele   . Cystocele, midline 02/15/2020    Past Medical History:  Diagnosis Date  . Arthritis   . Asthma    WELL CONTROLLED  . Diabetes mellitus without complication (HCC)   . GERD (gastroesophageal reflux disease)   . History of hiatal hernia   . Hypertension    NO MEDS     Transfusion: none   Consultants (if any):   Discharged Condition: Improved  Hospital Course: Kristie Hoover is an 70 y.o. female who was admitted 08/11/2020 with a diagnosis of massive irreparable rotator cuff tear with early cuff arthropathy of the left shoulder and went to the operating room on 08/11/2020 and underwent the above named procedures.    Surgeries: Procedure(s): REVERSE SHOULDER ARTHROPLASTY on 08/11/2020 Patient tolerated the surgery well. Taken to PACU where she was stabilized and then transferred to the orthopedic floor.  Started on Lovenox 40 mg q 24 hrs. Foot pumps applied bilaterally at 80 mm. Heels elevated on bed with rolled towels. No evidence of DVT. Negative Homan. Physical therapy started on day #1 for gait training and transfer. OT started day #1 for ADL and assisted devices.  Patient's foley was d/c on day #1. Patient's IV was d/c on day #1.  Patient with no one at home on postop day 1.  No way of getting home on postop day 1.  On post op day #2 patient was stable and ready for discharge to home with home health PT.  Implants: All press-fit Integra system with a 8 mm stem, a large metaphyseal body, a +3 mm humeral platform, a mini baseplate, and a 38 mm  concentric 2 mm laterally offset glenosphere.   She was given perioperative antibiotics:  Anti-infectives (From admission, onward)   Start     Dose/Rate Route Frequency Ordered Stop   08/11/20 1300  ceFAZolin (ANCEF) IVPB 2g/100 mL premix        2 g 200 mL/hr over 30 Minutes Intravenous Every 6 hours 08/11/20 1157 08/12/20 0050   08/11/20 0727  ceFAZolin (ANCEF) 2-4 GM/100ML-% IVPB       Note to Pharmacy: Christene Slates   : cabinet override      08/11/20 0727 08/12/20 0008   08/11/20 0600  ceFAZolin (ANCEF) IVPB 2g/100 mL premix        2 g 200 mL/hr over 30 Minutes Intravenous On call to O.R. 08/10/20 2307 08/11/20 9485    .  She was given sequential compression devices, early ambulation, and aspirin for DVT prophylaxis.  She benefited maximally from the hospital stay and there were no complications.    Recent vital signs:  Vitals:   08/13/20 0420 08/13/20 0852  BP: 128/63 (!) 141/74  Pulse: 90 82  Resp: 18 18  Temp: 98.1 F (36.7 C) 98.9 F (37.2 C)  SpO2: 98% 100%    Recent laboratory studies:  Lab Results  Component Value Date   HGB 10.5 (L) 08/13/2020   HGB 12.1 08/12/2020   HGB 12.6 08/11/2020   Lab Results  Component Value Date   WBC 9.8 08/13/2020   PLT 166 08/13/2020   Lab Results  Component Value Date   INR 1.1 03/29/2020   Lab Results  Component Value Date   NA 141 08/13/2020   K 3.2 (L) 08/13/2020   CL 106 08/13/2020   CO2 26 08/13/2020   BUN 9 08/13/2020   CREATININE 0.76 08/13/2020   GLUCOSE 154 (H) 08/13/2020    Discharge Medications:   Allergies as of 08/13/2020      Reactions   Oxycodone-acetaminophen    Hallucinations    Prednisone Hives   Tramadol Other (See Comments)   Hallucinations   Erythromycin Rash      Medication List    TAKE these medications   acetaminophen 500 MG tablet Commonly known as: TYLENOL Take 1,000 mg by mouth every 6 (six) hours as needed for moderate pain or headache.   aspirin EC 325 MG tablet Take  1 tablet (325 mg total) by mouth daily.   Biotin 08676 MCG Tabs Take 20,000 mcg by mouth daily.   brimonidine 0.2 % ophthalmic solution Commonly known as: ALPHAGAN Place 1 drop into both eyes 2 (two) times daily.   cyclobenzaprine 10 MG tablet Commonly known as: FLEXERIL Take 10 mg by mouth 3 (three) times daily as needed for muscle spasms.   etodolac 400 MG tablet Commonly known as: LODINE Take 400 mg by mouth 2 (two) times daily.   Fluticasone-Salmeterol 100-50 MCG/DOSE Aepb Commonly known as: ADVAIR Inhale 1 puff into the lungs See admin instructions. Inhale 1 puff twice daily may take a third puff at bedtime as needed for shortness of breath   furosemide 40 MG tablet Commonly known as: LASIX Take 40 mg by mouth daily.   glipiZIDE-metformin 2.5-500 MG tablet Commonly known as: METAGLIP Take 1 tablet by mouth daily before breakfast.   latanoprost 0.005 % ophthalmic solution Commonly known as: XALATAN Place 1 drop into both eyes at bedtime.   montelukast 10 MG tablet Commonly known as: SINGULAIR Take 10 mg by mouth every morning.   multivitamin with minerals Tabs tablet Take 1 tablet by mouth daily.   oxyCODONE-acetaminophen 5-325 MG tablet Commonly known as: PERCOCET/ROXICET Take 0.5-1 tablets by mouth every 6 (six) hours as needed for moderate pain. What changed:   how much to take  when to take this   pantoprazole 40 MG tablet Commonly known as: PROTONIX Take 40 mg by mouth every morning.   potassium chloride SA 20 MEQ tablet Commonly known as: KLOR-CON Take 20 mEq by mouth 2 (two) times daily.   pravastatin 40 MG tablet Commonly known as: PRAVACHOL Take 40 mg by mouth every morning.   timolol 0.5 % ophthalmic solution Commonly known as: BETIMOL Place 1 drop into both eyes 2 (two) times daily.       Diagnostic Studies: DG Shoulder Left Port  Result Date: 08/11/2020 CLINICAL DATA:  Left shoulder replacement. EXAM: LEFT SHOULDER COMPARISON:   None. FINDINGS: The left glenoid and humeral components appear to be well situated. Expected postoperative changes are seen in the surrounding soft tissues. IMPRESSION: Status post left shoulder arthroplasty. Electronically Signed   By: Lupita Raider M.D.   On: 08/11/2020 12:27   [place] OR NERVE BLOCK-IMAGE ONLY [facility])  Result Date: 08/11/2020 There is no interpretation for this exam.  This order is for images obtained during a surgical procedure.  Please See "Surgeries" Tab for more information regarding the procedure.    Disposition: home with home health PT      Follow-up Information    Anson [place], PA-C Follow up in 2  week(s).   Specialty: Physician Assistant Contact information: [address] [place] [address] [phone]                Signed: Madelyn Flavors 08/13/2020, 11:08 AM

## 2020-08-12 NOTE — TOC Progression Note (Signed)
Transition of Care [facility]) - Progression Note    Patient Details  Name: NIVEA WOJDYLA MRN: [id number] Date of Birth: 1950/10/21  Transition of Care [facility]) CM/SW Contact  Caryn Section, RN Phone Number: 08/12/2020, 12:50 PM  Clinical Narrative:   Patient is currently on the [facility] list, verified by [place] at [facility].  Patient states she is planning to discharge tomorrow.         Expected Discharge Plan and Services           Expected Discharge Date: 08/12/20                                     Social Determinants of Health (SDOH) Interventions    Readmission Risk Interventions No flowsheet data found.

## 2020-08-12 NOTE — Progress Notes (Signed)
Physical Therapy Treatment Patient Details Name: Kristie Hoover MRN: [id number] DOB: 1951-02-17 Today's Date: 08/12/2020    History of Present Illness Patient is a 70 year old female with massive irreparable rotator cuff tear with early cuff arthropathy, left shoulder. Patient elected for a left reverse total shoulder arthroplasty completed 08/11/20.    PT Comments    Pt sitting EOB upon arrival, L UE poorly supported by sling, pt unable to recognize and/or correct independently.  Pt complete several sit<> stand transfers with supervision after 1-3 attempts.  Gait training in room and hallway without AD 119ft and CGA due to concerns for LOB due to frequent miss stepping.  Pt became fatigued required a chair to rest, no c/o dizziness or SOB BP 137/62, HR 89,  O2 sats on RA 94%. Pt assisted back to room and educated on calling for assistance for needs.  Will continue skilled PT services to improve safe mobility and strength prior to d/c.    Follow Up Recommendations  Home health PT     Equipment Recommendations  None recommended by PT    Recommendations for Other Services       Precautions / Restrictions Precautions Precautions: Fall;Shoulder Shoulder Interventions: Shoulder abduction pillow Required Braces or Orthoses: Sling Restrictions Weight Bearing Restrictions: Yes LUE Weight Bearing: Non weight bearing    Mobility  Bed Mobility               General bed mobility comments: pt received sitting EOB    Transfers Overall transfer level: Needs assistance Equipment used: None Transfers: Sit to/from Stand Sit to Stand: Supervision (after 1-3 attempts)         General transfer comment: cues for technique and increased safety awareness.  Ambulation/Gait Ambulation/Gait assistance: Min guard;Supervision Gait Distance (Feet): 125 Feet Assistive device: None Gait Pattern/deviations: Step-through pattern;Drifts right/left Gait velocity: decreased   General Gait  Details: Pt had a few miss steps concerning for future falls possibly due to decreased attention to stay focused on task and easily distracted with surrounding. Pt also required unplanned seated rest break due to c/o sudden fatigue.  BP 137/62, HR 89, O2 sats 94%.  Pt required w/c back to room due to fatigue.   Stairs             Wheelchair Mobility    Modified Rankin (Stroke Patients Only)       Balance Overall balance assessment: Needs assistance Sitting-balance support: Feet supported;No upper extremity supported Sitting balance-Leahy Scale: Good     Standing balance support: No upper extremity supported Standing balance-Leahy Scale: Good Standing balance comment: poor awarenes of LUE                            Cognition Arousal/Alertness: Awake/alert Behavior During Therapy: WFL for tasks assessed/performed Overall Cognitive Status: Within Functional Limits for tasks assessed                                 General Comments: poor insight into deficits, talks in circles      Exercises Other Exercises Other Exercises: Pt educated re: OT role, DME recs, d/c recs, falls prevnetion, ECS, sling mgmt, dressing techniques, polar care Other Exercises: Don/doff sling/polar care, sitting balance/tolerance    General Comments General comments (skin integrity, edema, etc.): Pt appears confused at times, howevermight be her baseline personality.  Pt lives alone, nephew helps but is  away currently.  Concerns for pt being able to donn arm sling at home.      Pertinent Vitals/Pain Pain Assessment: No/denies pain    Home Living                      Prior Function            PT Goals (current goals can now be found in the care plan section) Acute Rehab PT Goals Patient Stated Goal: to go home    Frequency    7X/week      PT Plan      Co-evaluation              AM-PAC PT "6 Clicks" Mobility   Outcome Measure  Help  needed turning from your back to your side while in a flat bed without using bedrails?: None Help needed moving from lying on your back to sitting on the side of a flat bed without using bedrails?: A Little Help needed moving to and from a bed to a chair (including a wheelchair)?: A Little Help needed standing up from a chair using your arms (e.g., wheelchair or bedside chair)?: A Little Help needed to walk in hospital room?: A Little Help needed climbing 3-5 steps with a railing? : A Little 6 Click Score: 19    End of Session Equipment Utilized During Treatment: Gait belt Activity Tolerance: Patient tolerated treatment well;No increased pain Patient left: in chair;with call bell/phone within reach   PT Visit Diagnosis: Unsteadiness on feet (R26.81);Other abnormalities of gait and mobility (R26.89)     Time: 1410-1449 PT Time Calculation (min) (ACUTE ONLY): 39 min  Charges:  $Gait Training: 8-22 mins $Therapeutic Activity: 8-22 mins $Neuromuscular Re-education: 8-22 mins                    Zadie Cleverly, PTA    Jannet Askew 08/12/2020, 3:10 PM

## 2020-08-12 NOTE — Progress Notes (Addendum)
   Subjective: 1 Day Post-Op Procedure(s) (LRB): REVERSE SHOULDER ARTHROPLASTY (Left) Patient reports pain as 0 on 0-10 scale.   Patient is well, and has had no acute complaints or problems Denies any CP, SOB, ABD pain. We will continue therapy today.  Plan is to go Home after hospital stay.  Objective: Vital signs in last 24 hours: Temp:  [97 F (36.1 C)-98 F (36.7 C)] 97.8 F (36.6 C) (05/27 0730) Pulse Rate:  [58-79] 77 (05/27 0730) Resp:  [16-18] 18 (05/27 0730) BP: (125-147)/(75-105) 136/84 (05/27 0730) SpO2:  [95 %-100 %] 99 % (05/27 0730) FiO2 (%):  [21 %] 21 % (05/26 1158)  Intake/Output from previous day: 05/26 0701 - 05/27 0700 In: 1250.3 [P.O.:60; I.V.:890.3; IV Piggyback:300] Out: 100 [Blood:100] Intake/Output this shift: No intake/output data recorded.  Recent Labs    08/11/20 0654 08/12/20 0457  HGB 12.6 12.1   Recent Labs    08/11/20 0654 08/12/20 0457  WBC  --  10.8*  RBC  --  4.06  HCT 37.0 38.5  PLT  --  165   Recent Labs    08/11/20 0654 08/12/20 0457  NA 142 139  K 3.1* 3.5  CL 102 107  CO2  --  21*  BUN 7* 10  CREATININE 0.60 0.74  GLUCOSE 176* 134*  CALCIUM  --  9.1   No results for input(s): LABPT, INR in the last 72 hours.  EXAM General - Patient is Alert, Appropriate and Oriented Extremity - Neurovascular intact Sensation intact distally Intact pulses distally Dorsiflexion/Plantar flexion intact No cellulitis present Compartment soft Dressing - dressing C/D/I and no drainage Motor Function - intact, moving digits well on exam.  Past Medical History:  Diagnosis Date  . Arthritis   . Asthma    WELL CONTROLLED  . Diabetes mellitus without complication (HCC)   . GERD (gastroesophageal reflux disease)   . History of hiatal hernia   . Hypertension    NO MEDS    Assessment/Plan:   1 Day Post-Op Procedure(s) (LRB): REVERSE SHOULDER ARTHROPLASTY (Left) Active Problems:   Status post reverse arthroplasty of  shoulder, left  Estimated body mass index is 27.99 kg/m as calculated from the following:   Height as of this encounter: 5\' 8"  (1.727 m).   Weight as of this encounter: 83.5 kg. Advance diet Up with therapy Pain well controlled. Vital signs stable Labs are stable Discharge patient home tomorrow pending progress with physical therapy.  Currently patient has no one at home to help her.  Patient's nephew will be in town tomorrow to help with patient's home health needs.  DVT Prophylaxis - Lovenox, TED hose and SCDs    T. , PA-C [facility] 08/12/2020, 7:42 AM

## 2020-08-12 NOTE — Progress Notes (Signed)
Occupational Therapy Treatment Patient Details Name: Kristie Hoover MRN: [id number] DOB: 1950/10/16 Today's Date: 08/12/2020    History of present illness Patient is a 70 year old female with massive irreparable rotator cuff tear with early cuff arthropathy, left shoulder. Patient elected for a left reverse total shoulder arthroplasty completed 08/11/20.   OT comments  Kristie Hoover was seen for OT treatment on this date. Upon arrival to room pt seated EOB, agreeable to tx. Pt continues to demonstrate poor insight into the assist she needs. Pt repeatedly states she has multiple family members however when asked which member could assist 1 hr/day for dressing/polar mgmt pt denied having anyone available. (States her niece works 6am-10pm, states others work 1st shift and would not be free after, etc).   Pt continues to require cues t/o for sequencing donning/doffing sling and polar care. Pt would not be able to teach family member how to don/doff correctly. Pt requires MOD A don/doff polar care seated EOB. MIN A + MOD cues to don/doff sling immobilizer seated EOB. SBA don/doff L sock. Pt making progress toward goals. Pt continues to benefit from skilled OT services to maximize return to PLOF and minimize risk of future falls, injury, caregiver burden, and readmission. Will continue to follow POC. Discharge recommendation remains appropriate.    Follow Up Recommendations  Home health OT;Supervision - Intermittent    Equipment Recommendations  None recommended by OT    Recommendations for Other Services      Precautions / Restrictions Precautions Precautions: Fall;Shoulder Shoulder Interventions: Shoulder abduction pillow Required Braces or Orthoses: Sling Restrictions Weight Bearing Restrictions: Yes LUE Weight Bearing: Non weight bearing       Mobility Bed Mobility               General bed mobility comments: pt received sitting EOB                     Balance Overall  balance assessment: Needs assistance Sitting-balance support: Feet supported;No upper extremity supported Sitting balance-Leahy Scale: Good                                     ADL either performed or assessed with clinical judgement   ADL Overall ADL's : Needs assistance/impaired                                       General ADL Comments: MOD A don/doff polar care seated EOB. MIN A + MOD cues to don/doff sling immobilizer seated EOB. SBA don/doff L sock.               Cognition Arousal/Alertness: Awake/alert Behavior During Therapy: WFL for tasks assessed/performed Overall Cognitive Status: Within Functional Limits for tasks assessed                                 General Comments: poor insight into deficits, talks in circles        Exercises Exercises: Other exercises Other Exercises Other Exercises: Pt educated re: OT role, DME recs, d/c recs, falls prevnetion, ECS, sling mgmt, dressing techniques, polar care Other Exercises: Don/doff sling/polar care, sitting balance/tolerance           Pertinent Vitals/ Pain       Pain  Assessment: No/denies pain         Frequency  Min 2X/week        Progress Toward Goals  OT Goals(current goals can now be found in the care plan section)  Progress towards OT goals: Progressing toward goals  Acute Rehab OT Goals Patient Stated Goal: to go home OT Goal Formulation: With patient Time For Goal Achievement: 08/25/20 Potential to Achieve Goals: Fair ADL Goals Pt Will Perform Grooming: standing;Independently Pt Will Perform Upper Body Bathing: with min guard assist Pt Will Perform Upper Body Dressing: with min assist;sitting Pt Will Perform Toileting - Clothing Manipulation and hygiene: with modified independence;sitting/lateral leans  Plan Discharge plan remains appropriate;Frequency remains appropriate       AM-PAC OT "6 Clicks" Daily Activity     Outcome Measure   Help  from another person eating meals?: A Little Help from another person taking care of personal grooming?: A Little Help from another person toileting, which includes using toliet, bedpan, or urinal?: A Little Help from another person bathing (including washing, rinsing, drying)?: A Little Help from another person to put on and taking off regular upper body clothing?: A Lot Help from another person to put on and taking off regular lower body clothing?: A Little 6 Click Score: 17    End of Session    OT Visit Diagnosis: Other abnormalities of gait and mobility (R26.89)   Activity Tolerance Patient tolerated treatment well   Patient Left with call bell/phone within reach;in bed;with bed alarm set   Nurse Communication Mobility status        Time: 2951-8841 OT Time Calculation (min): 56 min  Charges: OT General Charges $OT Visit: 1 Visit OT Treatments $Self Care/Home Management : 53-67 mins   Kathie Dike, M.S. OTR/L  08/12/20, 1:11 PM  ascom [phone]

## 2020-08-12 NOTE — Discharge Instructions (Signed)
[facility]  Phone: [phone]  Fax: [phone]   Discharge Instructions after Reverse Shoulder Replacement    1. Activity/Sling: You are to be non-weight bearing on operative extremity. A sling/shoulder immobilizer has been provided for you. Only remove the sling to perform elbow, wrist, and hand RoM exercises and hygiene/dressing. Active reaching and lifting are not permitted. You will be given further instructions on sling use at your first physical therapy visit and postoperative visit with Dr. Allena Katz.   2. Dressings: Dressing may be removed at 1st physical therapy visit (~3-4 days after surgery). Afterwards, you may either leave open to air (if no drainage) or cover with dry, sterile dressing. If you have steri-strips on your wound, please do not remove them. They will fall off on their own. You may shower 5 days after surgery. Please pat incision dry. Do not rub or place any shear forces across incision. If there is drainage or any opening of incision after 5 days, please notify our offices immediately.    3. Driving:  Plan on not driving for six weeks. Please note that you are advised NOT to drive while taking narcotic pain medications as you may be impaired and unsafe to drive.   4. Medications:  - You have been provided a prescription for narcotic pain medicine (usually oxycodone). After surgery, take 1-2 narcotic tablets every 4 hours if needed for severe pain. Please start this as soon as you begin to start having pain (if you received a nerve block, start taking as soon as this wears off).  - A prescription for anti-nausea medication will be provided in case the narcotic medicine causes nausea - take 1 tablet every 6 hours only if nauseated.  - Take enteric coated aspirin 325 mg once daily for 4 weeks to prevent blood clots. Do not take aspirin if you have an aspirin sensitivity/allergy or asthma or are on an anticoagulant (blood thinner) already. If so, then your home  anticoagulant will be resume and managed - do not take aspirin. -Take tylenol 1000mg  (2 Extra strength or 3 regular strength tablets) every 8 hours for pain. This will reduce the amount of narcotic medication needed. May stop tylenol when you are having minimal pain. - Take a stool softener (Colace, Dulcolax or Senakot) if you are using narcotic pain medications to help with constipation that is associated with narcotic use. - DO NOT take ANY nonsteroidal anti-inflammatory pain medications: Advil, Motrin, Ibuprofen, Aleve, Naproxen, or Naprosyn.   If you are taking prescription medication for anxiety, depression, insomnia, muscle spasm, chronic pain, or for attention deficit disorder you are advised that you are at a higher risk of adverse effects with use of narcotics post-op, including narcotic addiction/dependence, depressed breathing, death. If you use non-prescribed substances: alcohol, marijuana, cocaine, heroin, methamphetamines, etc., you are at a higher risk of adverse effects with use of narcotics post-op, including narcotic addiction/dependence, depressed breathing, death. You are advised that taking > 50 morphine milligram equivalents (MME) of narcotic pain medication per day results in twice the risk of overdose or death. For your prescription provided: oxycodone 5 mg - taking more than 6 tablets per day after the first few days of surgery.   5. Physical Therapy: 1-2 times per week for ~12 weeks. Therapy typically starts on post operative Day 3 or 4. You have been provided an order for physical therapy. The therapist will provide home exercises. Please contact our offices if this appointment has not been scheduled.    6. Work:  May do light duty/desk job in approximately 2 weeks when off of narcotics, pain is well-controlled, and swelling has decreased if able to function with one arm in sling. Full work may take 6 weeks if light motions and function of both arms is required. Lifting jobs may  require 12 weeks.     If you find that they have not been scheduled please call the Orthopaedic Appointment front desk at [phone].

## 2020-08-13 DIAGNOSIS — M19012 Primary osteoarthritis, left shoulder: Secondary | ICD-10-CM | POA: Diagnosis not present

## 2020-08-13 LAB — BASIC METABOLIC PANEL
Anion gap: 9 (ref 5–15)
BUN: 9 mg/dL (ref 8–23)
CO2: 26 mmol/L (ref 22–32)
Calcium: 9 mg/dL (ref 8.9–10.3)
Chloride: 106 mmol/L (ref 98–111)
Creatinine, Ser: 0.76 mg/dL (ref 0.44–1.00)
GFR, Estimated: >60 mL/min (ref >60)
Glucose, Bld: 154 mg/dL — ABNORMAL HIGH (ref 70–99)
Potassium: 3.2 mmol/L — ABNORMAL LOW (ref 3.5–5.1)
Sodium: 141 mmol/L (ref 135–145)

## 2020-08-13 LAB — CBC
HCT: 31.2 % — ABNORMAL LOW (ref 36.0–46.0)
Hemoglobin: 10.5 g/dL — ABNORMAL LOW (ref 12.0–15.0)
MCH: 30 pg (ref 26.0–34.0)
MCHC: 33.7 g/dL (ref 30.0–36.0)
MCV: 89.1 fL (ref 80.0–100.0)
Platelets: 166 10*3/uL (ref 150–400)
RBC: 3.5 MIL/uL — ABNORMAL LOW (ref 3.87–5.11)
RDW: 14.5 % (ref 11.5–15.5)
WBC: 9.8 10*3/uL (ref 4.0–10.5)
nRBC: 0 % (ref 0.0–0.2)

## 2020-08-13 LAB — GLUCOSE, CAPILLARY: Glucose-Capillary: 165 mg/dL — ABNORMAL HIGH (ref 70–99)

## 2020-08-13 NOTE — Progress Notes (Addendum)
  Subjective: 2 Days Post-Op Procedure(s) (LRB): REVERSE SHOULDER ARTHROPLASTY (Left) Patient reports pain as well-controlled.   Patient is well, and has had no acute complaints or problems Plan is to go Home after hospital stay. Negative for chest pain and shortness of breath Fever: no Gastrointestinal: negative for nausea and vomiting.   Objective: Vital signs in last 24 hours: Temp:  [97.5 F (36.4 C)-100.1 F (37.8 C)] 98.9 F (37.2 C) (05/28 0852) Pulse Rate:  [78-90] 82 (05/28 0852) Resp:  [17-18] 18 (05/28 0852) BP: (128-147)/(63-74) 141/74 (05/28 0852) SpO2:  [97 %-100 %] 100 % (05/28 0852)  Intake/Output from previous day:  Intake/Output Summary (Last 24 hours) at 08/13/2020 1100 Last data filed at 08/13/2020 1027 Gross per 24 hour  Intake 240 ml  Output --  Net 240 ml    Intake/Output this shift: No intake/output data recorded.  Labs: Recent Labs    08/11/20 0654 08/12/20 0457 08/13/20 0427  HGB 12.6 12.1 10.5*   Recent Labs    08/12/20 0457 08/13/20 0427  WBC 10.8* 9.8  RBC 4.06 3.50*  HCT 38.5 31.2*  PLT 165 166   Recent Labs    08/12/20 0457 08/13/20 0427  NA 139 141  K 3.5 3.2*  CL 107 106  CO2 21* 26  BUN 10 9  CREATININE 0.74 0.76  GLUCOSE 134* 154*  CALCIUM 9.1 9.0   No results for input(s): LABPT, INR in the last 72 hours.   EXAM General - Patient is Alert, Appropriate and Oriented Extremity - Compartment soft sensation slightly decreased but intact to light touch over the radial and median nerve distrbutions, intact over musculocutaneous; slingshot sling in place  Dressing/Incision -mild dried blood on honeycomb Motor Function - intact, moving wrist and hand well on exam, able to make all signs   Assessment/Plan: 2 Days Post-Op Procedure(s) (LRB): REVERSE SHOULDER ARTHROPLASTY (Left) Active Problems:   Status post reverse arthroplasty of shoulder, left  Estimated body mass index is 27.99 kg/m as calculated from the  following:   Height as of this encounter: 5\' 8"  (1.727 m).   Weight as of this encounter: 83.5 kg. Advance diet Up with therapy Discharge home with home health   Labs show slight hypokalemia today. Patient will resume home K on discharge.    DVT Prophylaxis - Lovenox   , PA-C [facility] 08/13/2020, 11:00 AM

## 2020-08-13 NOTE — Progress Notes (Signed)
Physical Therapy Treatment Patient Details Name: Kristie Hoover MRN: [id number] DOB: November 06, 1950 Today's Date: 08/13/2020    History of Present Illness Patient is a 70 year old female with massive irreparable rotator cuff tear with early cuff arthropathy, left shoulder. Patient elected for a left reverse total shoulder arthroplasty completed 08/11/20.    PT Comments    Pt seen for L UE sling education via pictured handout and repositioning of arm in sling to fully support.  Pt is unable to recognize if sling is in appropriate position or not.  Pt demonstrated ability to transfer and move about room as well as hallway without assistive device or LOB.  Despite concerns for pt's cognitive well being vs baseline personality, pt is functionally able to safely return home with assistance and HHPT services.  Nephew to help once home.     Follow Up Recommendations  Home health PT     Equipment Recommendations  None recommended by PT    Recommendations for Other Services       Precautions / Restrictions Precautions Precautions: Fall;Shoulder Shoulder Interventions: Shoulder abduction pillow Precaution Comments: Sling on at all times Required Braces or Orthoses: Sling (Left UE) Restrictions Weight Bearing Restrictions: Yes LUE Weight Bearing: Non weight bearing    Mobility  Bed Mobility Overal bed mobility: Modified Independent                  Transfers Overall transfer level: Needs assistance Equipment used: None Transfers: Sit to/from Stand Sit to Stand: Supervision;Modified independent (Device/Increase time)         General transfer comment: cues for technique and increased safety awareness.  Ambulation/Gait Ambulation/Gait assistance: Supervision Gait Distance (Feet): 150 Feet Assistive device: None Gait Pattern/deviations: Step-through pattern;Drifts right/left Gait velocity: decreased   General Gait Details: Pt had a few miss steps concerning for future falls  possibly due to decreased attention to stay focused on task and easily distracted with surrounding. Pt also required unplanned seated rest break due to c/o sudden fatigue.  BP 137/62, HR 89, O2 sats 94%.  Pt required w/c back to room due to fatigue.   Stairs             Wheelchair Mobility    Modified Rankin (Stroke Patients Only)       Balance                                            Cognition Arousal/Alertness: Awake/alert Behavior During Therapy: WFL for tasks assessed/performed Overall Cognitive Status: Within Functional Limits for tasks assessed                                 General Comments: poor insight into deficits, talks in circles      Exercises General Exercises - Lower Extremity Ankle Circles/Pumps: AROM;Both;15 reps Long Arc Quad: AROM;Both;10 reps Hip Flexion/Marching: AROM;Both;15 reps    General Comments General comments (skin integrity, edema, etc.): Pt issued handout with picture of proper donning position of sling shot for L UE since pt has not had it donned properly with arm unsupported.      Pertinent Vitals/Pain      Home Living                      Prior Function  PT Goals (current goals can now be found in the care plan section) Acute Rehab PT Goals Patient Stated Goal: to go home    Frequency    7X/week      PT Plan Current plan remains appropriate    Co-evaluation              AM-PAC PT "6 Clicks" Mobility   Outcome Measure  Help needed turning from your back to your side while in a flat bed without using bedrails?: None Help needed moving from lying on your back to sitting on the side of a flat bed without using bedrails?: A Little Help needed moving to and from a bed to a chair (including a wheelchair)?: A Little Help needed standing up from a chair using your arms (e.g., wheelchair or bedside chair)?: A Little Help needed to walk in hospital room?: A  Little Help needed climbing 3-5 steps with a railing? : A Little 6 Click Score: 19    End of Session Equipment Utilized During Treatment: Gait belt Activity Tolerance: Patient tolerated treatment well;No increased pain Patient left: in chair;with call bell/phone within reach Nurse Communication: Mobility status (Pt can't find dentures) PT Visit Diagnosis: Unsteadiness on feet (R26.81);Other abnormalities of gait and mobility (R26.89)     Time: 5170-0174 PT Time Calculation (min) (ACUTE ONLY): 29 min  Charges:  $Gait Training: 8-22 mins $Therapeutic Activity: 8-22 mins                     {Cheyrl Buley, PTA   Jannet Askew 08/13/2020, 2:00 PM

## 2020-08-13 NOTE — Progress Notes (Signed)
Pt has all belongings but her dentures, RN and NT searched room and were not able to find. Dietary was also called in case it was on her tray and they did not have them either. Pt has everything else-shoulder immobilizer, polar care, all other belongings from home. PT has hard script for percocet and aspirin. Pt ride here and NT taking down to family.

## 2020-08-16 LAB — SURGICAL PATHOLOGY

## 2020-11-25 ENCOUNTER — Telehealth: Payer: Self-pay

## 2020-11-25 NOTE — Telephone Encounter (Signed)
Error

## 2021-01-23 ENCOUNTER — Other Ambulatory Visit: Payer: Self-pay | Admitting: Internal Medicine

## 2021-01-23 DIAGNOSIS — Z1231 Encounter for screening mammogram for malignant neoplasm of breast: Secondary | ICD-10-CM

## 2021-03-02 ENCOUNTER — Other Ambulatory Visit: Payer: Self-pay

## 2021-03-02 ENCOUNTER — Ambulatory Visit
Admission: RE | Admit: 2021-03-02 | Discharge: 2021-03-02 | Disposition: A | Discharge status: Home / Self Care | Payer: Medicare Other | Source: Ambulatory Visit | Attending: Internal Medicine | Admitting: Internal Medicine

## 2021-03-02 DIAGNOSIS — Z1231 Encounter for screening mammogram for malignant neoplasm of breast: Secondary | ICD-10-CM | POA: Diagnosis present

## 2021-05-23 ENCOUNTER — Other Ambulatory Visit: Payer: Self-pay | Admitting: Surgery

## 2021-06-08 ENCOUNTER — Other Ambulatory Visit: Payer: Self-pay

## 2021-06-08 ENCOUNTER — Encounter
Admission: RE | Admit: 2021-06-08 | Discharge: 2021-06-08 | Disposition: A | Discharge status: Home / Self Care | Payer: Medicare Other | Source: Ambulatory Visit | Attending: Surgery | Admitting: Surgery

## 2021-06-08 DIAGNOSIS — D649 Anemia, unspecified: Secondary | ICD-10-CM

## 2021-06-08 DIAGNOSIS — E119 Type 2 diabetes mellitus without complications: Secondary | ICD-10-CM

## 2021-06-08 HISTORY — DX: Anemia, unspecified: D64.9

## 2021-06-08 HISTORY — DX: Pneumonia, unspecified organism: J18.9

## 2021-06-08 HISTORY — DX: Other intervertebral disc degeneration, lumbar region without mention of lumbar back pain or lower extremity pain: M51.369

## 2021-06-08 HISTORY — DX: Renal tubulo-interstitial disease, unspecified: N15.9

## 2021-06-08 HISTORY — DX: Other intervertebral disc degeneration, lumbar region: M51.36

## 2021-06-08 NOTE — Patient Instructions (Addendum)
Your procedure is scheduled on:06-14-21 Wednesday ?Report to the Registration Desk on the 1st floor of the [facility].Then proceed to the 2nd floor Surgery Desk in the Medical [facility] ?To find out your arrival time, please call [phone] between 1PM - 3PM on:06-13-21 Tuesday ? ?REMEMBER: ?Instructions that are not followed completely may result in serious medical risk, up to and including death; or upon the discretion of your surgeon and anesthesiologist your surgery may need to be rescheduled. ? ?Do not eat food after midnight the night before surgery.  ?No gum chewing, lozengers or hard candies. ? ?You may however, drink Water up to 2 hours before you are scheduled to arrive for your surgery. Do not drink anything within 2 hours of your scheduled arrival time. ? ?Type 1 and Type 2 diabetics should only drink water. ? ?In addition, your doctor has ordered for you to drink the provided  ?Ensure Pre-Surgery Clear Carbohydrate Drink  ?Gatorade G2 ?Drinking this carbohydrate drink up to two hours before surgery helps to reduce insulin resistance and improve patient outcomes. Please complete drinking 2 hours prior to scheduled arrival time. ? ?TAKE THESE MEDICATIONS THE MORNING OF SURGERY WITH A SIP OF WATER: ?-pravastatin (PRAVACHOL) ?-montelukast (SINGULAIR) ?-pantoprazole (PROTONIX)-take one the night before and one on the morning of surgery - helps to prevent nausea after surgery.) ? ?Use your Fluticasone-Salmeterol (ADVAIR) Inhaler the day of surgery ? ?Stop your glipiZIDE-metformin (METAGLIP) 2 days prior to surgery-Last dose on 06-11-21 Sunday ? ?One week prior to surgery: ?Stop Anti-inflammatories (NSAIDS) such as etodolac (LODINE), Advil, Aleve, Ibuprofen, Motrin, Naproxen, Naprosyn and Aspirin based products such as Excedrin, Goodys Powder, BC Powder.You may however, take Tylenol if needed for pain up until the day of surgery. ? ?Stop ANY OVER THE COUNTER supplements/vitamins NOW (06-08-21) until after surgery  (Biotin) Continue your potassium chloride SA (K-DUR,KLOR-CON) up until the day prior to surgery ? ?No Alcohol for 24 hours before or after surgery. ? ?No Smoking including e-cigarettes for 24 hours prior to surgery.  ?No chewable tobacco products for at least 6 hours prior to surgery.  ?No nicotine patches on the day of surgery. ? ?Do not use any "recreational" drugs for at least a week prior to your surgery.  ?Please be advised that the combination of cocaine and anesthesia may have negative outcomes, up to and including death. ?If you test positive for cocaine, your surgery will be cancelled. ? ?On the morning of surgery brush your teeth with toothpaste and water, you may rinse your mouth with mouthwash if you wish. ?Do not swallow any toothpaste or mouthwash. ? ?Do not wear jewelry, make-up, hairpins, clips or nail polish. ? ?Do not wear lotions, powders, or perfumes.  ? ?Do not shave body from the neck down 48 hours prior to surgery just in case you cut yourself which could leave a site for infection.  ?Also, freshly shaved skin may become irritated if using the CHG soap. ? ?Contact lenses, hearing aids and dentures may not be worn into surgery. ? ?Do not bring valuables to the hospital. [facility] is not responsible for any missing/lost belongings or valuables.  ? ?Notify your doctor if there is any change in your medical condition (cold, fever, infection). ? ?Wear comfortable clothing (specific to your surgery type) to the hospital. ? ?After surgery, you can help prevent lung complications by doing breathing exercises.  ?Take deep breaths and cough every 1-2 hours. Your doctor may order a device called an Incentive Spirometer to help  you take deep breaths. ?When coughing or sneezing, hold a pillow firmly against your incision with both hands. This is called ?splinting.? Doing this helps protect your incision. It also decreases belly discomfort. ? ?If you are being admitted to the hospital overnight, leave  your suitcase in the car. ?After surgery it may be brought to your room. ? ?If you are being discharged the day of surgery, you will not be allowed to drive home. ?You will need a responsible adult (18 years or older) to drive you home and stay with you that night.  ? ?If you are taking public transportation, you will need to have a responsible adult (18 years or older) with you. ?Please confirm with your physician that it is acceptable to use public transportation.  ? ?Please call the Pre-admissions Testing Dept. at [phone] if you have any questions about these instructions. ? ?Surgery Visitation Policy: ? ?Patients undergoing a surgery or procedure may have two family members or support persons with them as long as the person is not COVID-19 positive or experiencing its symptoms.  ?

## 2021-06-12 ENCOUNTER — Telehealth: Payer: Self-pay | Admitting: Urgent Care

## 2021-06-12 ENCOUNTER — Encounter
Admission: RE | Admit: 2021-06-12 | Discharge: 2021-06-12 | Disposition: A | Discharge status: Home / Self Care | Payer: Medicare Other | Source: Ambulatory Visit | Attending: Surgery | Admitting: Surgery

## 2021-06-12 ENCOUNTER — Other Ambulatory Visit: Payer: Self-pay

## 2021-06-12 ENCOUNTER — Encounter: Payer: Self-pay | Admitting: Urgent Care

## 2021-06-12 DIAGNOSIS — E119 Type 2 diabetes mellitus without complications: Secondary | ICD-10-CM | POA: Diagnosis not present

## 2021-06-12 DIAGNOSIS — Z01818 Encounter for other preprocedural examination: Secondary | ICD-10-CM | POA: Insufficient documentation

## 2021-06-12 DIAGNOSIS — D649 Anemia, unspecified: Secondary | ICD-10-CM

## 2021-06-12 LAB — BASIC METABOLIC PANEL
Anion gap: 13 (ref 5–15)
BUN: 9 mg/dL (ref 8–23)
CO2: 25 mmol/L (ref 22–32)
Calcium: 9.2 mg/dL (ref 8.9–10.3)
Chloride: 105 mmol/L (ref 98–111)
Creatinine, Ser: 0.83 mg/dL (ref 0.44–1.00)
GFR, Estimated: >60 mL/min (ref >60)
Glucose, Bld: 206 mg/dL — ABNORMAL HIGH (ref 70–99)
Potassium: 2.5 mmol/L — CL (ref 3.5–5.1)
Sodium: 143 mmol/L (ref 135–145)

## 2021-06-12 LAB — CBC
HCT: 37.5 % (ref 36.0–46.0)
Hemoglobin: 11.8 g/dL — ABNORMAL LOW (ref 12.0–15.0)
MCH: 28.9 pg (ref 26.0–34.0)
MCHC: 31.5 g/dL (ref 30.0–36.0)
MCV: 91.9 fL (ref 80.0–100.0)
Platelets: 237 10*3/uL (ref 150–400)
RBC: 4.08 MIL/uL (ref 3.87–5.11)
RDW: 15.5 % (ref 11.5–15.5)
WBC: 4.7 10*3/uL (ref 4.0–10.5)
nRBC: 0 % (ref 0.0–0.2)

## 2021-06-12 NOTE — Progress Notes (Signed)
?  [facility] ?Perioperative Services: Pre-Admission/Anesthesia Testing ? ?Abnormal Lab Notification and Treatment Plan of Care ?  ?Date: 06/12/21 ? ?Name: Kristie Hoover ?MRN:   [id number] ? ?Re: Abnormal labs noted during PAT appointment  ? ?Notified:  ?Provider Name Provider Role Notification Mode  ?Leron Croak, MD Orthopedics (Surgeon) Routed and/or faxed via [facility]  ?Dewaine Oats, MD Internal/Family Medicine (PCP) Routed and/or faxed via [facility]  ? ?Clinical Information and Notes:  ?ABNORMAL LAB VALUE(S): ?Lab Results  ?Component Value Date  ? K 2.5 (LL) 06/12/2021  ? ?BONNE WHACK is scheduled for an elective ENDOSCOPIC LEFT CARPAL TUNNEL RELEASE on 06/14/2021. In review of her medication reconciliation, it is noted that the patient is taking prescribed diuretic medication (furosemide 40 mg daily).  Additionally, she is currently on oral K+ supplementation (KDUR mEq BID). Please note, in efforts to promote a safe and effective anesthetic course, per current guidelines/standards set by the [facility] anesthesia team, the minimal acceptable K+ level for the patient to proceed with general anesthesia is 3.0 mmol/L. With that being said, at her current level, patient's procedure is at risk of being postponed pending optimization of the noted hypokalemia. In efforts to prevent case from being postponed, PAT APP will treat patient's hypokalemia in efforts to optimize her for her procedure. ? ?Impression and Plan:  ?[name] scheduled for the above procedure on 06/14/2021.  As part of her preoperative work-up, patient presented to the PAT clinic on 06/12/2021 for labs.  Preoperative lab work showed that patient's K+ level was significantly decreased at 2.5 mmol/L.  Again, patient is already taking oral supplementation.  We will make efforts to optimize patient's K+ level as follows: ? ?Confirmed that patient taking prescribed KDUR 20 mEq twice daily as prescribed. ?Patient advising that that she has  been taking her medication as prescribed without any missed doses.   ? ?Patient has already taken 20 mEq dose this morning.   ?We will have her take 3 tablets (60 mEq) tonight ?Start 40 mEq TWICE tomorrow, being sure to take the 40 mEq dose on the morning of her surgery.  ? ?Order has been entered for patient to have her K+ level rechecked on the morning of her surgery to ensure optimization and correction of noted derangement. ? ?Patient encouraged to follow-up with PCP postoperatively to have lab work rechecked.  Discussed potential need for diuretic change or for an increase in her daily K+ supplementation dose. I will forward a copy of this note to her PCP Arlana Pouch, MD) to make him aware of her pre-operative K+ level and need for post-operative recheck.  ? ?Results from today's labs and treatment plan of care has been forwarded to patient's primary attending surgeon Joice Lofts, MD) for review.  Patient was advised that if she experiences any difficulties with the prescribed plan of treatment, she should contact the PAT office and/or Dr. Binnie Rail office for further directives.  ? ?This is a Personal assistant; no formal response is required. ? ?Quentin Mulling, MSN, APRN, FNP-C, CEN ?[facility]  ?Peri-operative Services Nurse Practitioner ?Phone: [phone] ?Fax: [phone] ?06/12/21 2:24 PM ? ?

## 2021-06-12 NOTE — Pre-Procedure Instructions (Addendum)
K+ today was 2.5 when pt came in for her pre-admit labs-Pt states she is taking 20 meq K+ bid. As instructed by Quentin Mulling NP pt was called and instructed per the following regarding her potassium: ? ?1. take 3 tonight (60 mEq) ?2. Start 40 mEq TWICE a day tomorrow... will take 40 mEq dose on am of surgery ?3. Call PCP to discuss having labs rechecked after surgery. Likely will need to change diuretic, or increase oral K+ dose daily.  ?4. Rechecking on am of surgery to make sure safe to proceed. ? ?Pt repeated these instructions regarding her K+ (Klor-Con/K-Dur) back to me multiple times and verbalized understanding ?

## 2021-06-14 ENCOUNTER — Ambulatory Visit: Admission: RE | Admit: 2021-06-14 | Payer: Medicare Other | Source: Ambulatory Visit | Admitting: Surgery

## 2021-06-14 ENCOUNTER — Encounter: Admission: RE | Payer: Self-pay | Source: Ambulatory Visit

## 2021-06-14 SURGERY — RELEASE, CARPAL TUNNEL, ENDOSCOPIC
Anesthesia: Choice | Site: Wrist | Laterality: Left

## 2021-08-03 ENCOUNTER — Encounter: Payer: Self-pay | Admitting: Ophthalmology

## 2021-08-07 ENCOUNTER — Other Ambulatory Visit
Admission: RE | Admit: 2021-08-07 | Discharge: 2021-08-07 | Disposition: A | Discharge status: Home / Self Care | Payer: Medicare Other | Attending: Anesthesiology | Admitting: Anesthesiology

## 2021-08-07 DIAGNOSIS — Z01812 Encounter for preprocedural laboratory examination: Secondary | ICD-10-CM | POA: Diagnosis present

## 2021-08-07 LAB — POTASSIUM: Potassium: 3.4 mmol/L — ABNORMAL LOW (ref 3.5–5.1)

## 2021-08-09 NOTE — Discharge Instructions (Signed)

## 2021-08-15 ENCOUNTER — Encounter: Payer: Self-pay | Admitting: Ophthalmology

## 2021-08-15 ENCOUNTER — Other Ambulatory Visit: Payer: Self-pay

## 2021-08-15 ENCOUNTER — Encounter
Admission: RE | Disposition: A | Discharge status: Home / Self Care | Payer: Self-pay | Source: Home / Self Care | Attending: Ophthalmology

## 2021-08-15 ENCOUNTER — Ambulatory Visit: Payer: Medicare Other | Admitting: Anesthesiology

## 2021-08-15 ENCOUNTER — Ambulatory Visit
Admission: RE | Admit: 2021-08-15 | Discharge: 2021-08-15 | Disposition: A | Discharge status: Home / Self Care | Payer: Medicare Other | Attending: Ophthalmology | Admitting: Ophthalmology

## 2021-08-15 DIAGNOSIS — M1990 Unspecified osteoarthritis, unspecified site: Secondary | ICD-10-CM | POA: Diagnosis not present

## 2021-08-15 DIAGNOSIS — K219 Gastro-esophageal reflux disease without esophagitis: Secondary | ICD-10-CM | POA: Diagnosis not present

## 2021-08-15 DIAGNOSIS — J45909 Unspecified asthma, uncomplicated: Secondary | ICD-10-CM | POA: Diagnosis not present

## 2021-08-15 DIAGNOSIS — I10 Essential (primary) hypertension: Secondary | ICD-10-CM | POA: Diagnosis not present

## 2021-08-15 DIAGNOSIS — Z7984 Long term (current) use of oral hypoglycemic drugs: Secondary | ICD-10-CM | POA: Insufficient documentation

## 2021-08-15 DIAGNOSIS — H2511 Age-related nuclear cataract, right eye: Secondary | ICD-10-CM | POA: Diagnosis not present

## 2021-08-15 DIAGNOSIS — Z01818 Encounter for other preprocedural examination: Secondary | ICD-10-CM

## 2021-08-15 DIAGNOSIS — E1136 Type 2 diabetes mellitus with diabetic cataract: Secondary | ICD-10-CM | POA: Diagnosis not present

## 2021-08-15 HISTORY — DX: Presence of dental prosthetic device (complete) (partial): Z97.2

## 2021-08-15 HISTORY — PX: CATARACT EXTRACTION W/PHACO: SHX586

## 2021-08-15 LAB — GLUCOSE, CAPILLARY
Glucose-Capillary: 129 mg/dL — ABNORMAL HIGH (ref 70–99)
Glucose-Capillary: 123 mg/dL — ABNORMAL HIGH (ref 70–99)

## 2021-08-15 SURGERY — PHACOEMULSIFICATION, CATARACT, WITH IOL INSERTION
Anesthesia: Monitor Anesthesia Care | Site: Eye | Laterality: Right

## 2021-08-15 MED ORDER — SIGHTPATH DOSE#1 NA CHONDROIT SULF-NA HYALURON 40-17 MG/ML IO SOLN
INTRAOCULAR | Status: DC | PRN
  Administered 2021-08-15: 1 mL via INTRAOCULAR

## 2021-08-15 MED ORDER — FENTANYL CITRATE (PF) 100 MCG/2ML IJ SOLN
INTRAMUSCULAR | Status: DC | PRN
  Administered 2021-08-15: 50 ug via INTRAVENOUS

## 2021-08-15 MED ORDER — MIDAZOLAM HCL 2 MG/2ML IJ SOLN
INTRAMUSCULAR | Status: DC | PRN
  Administered 2021-08-15: 1 mg via INTRAVENOUS

## 2021-08-15 MED ORDER — SIGHTPATH DOSE#1 BSS IO SOLN
INTRAOCULAR | Status: DC | PRN
  Administered 2021-08-15: 15 mL

## 2021-08-15 MED ORDER — SIGHTPATH DOSE#1 BSS IO SOLN
INTRAOCULAR | Status: DC | PRN
  Administered 2021-08-15: 79 mL via OPHTHALMIC

## 2021-08-15 MED ORDER — MOXIFLOXACIN HCL 0.5 % OP SOLN
OPHTHALMIC | Status: DC | PRN
  Administered 2021-08-15: 0.2 mL via OPHTHALMIC

## 2021-08-15 MED ORDER — BRIMONIDINE TARTRATE-TIMOLOL 0.2-0.5 % OP SOLN
OPHTHALMIC | Status: DC | PRN
  Administered 2021-08-15: 1 [drp] via OPHTHALMIC

## 2021-08-15 MED ORDER — SIGHTPATH DOSE#1 BSS IO SOLN
INTRAOCULAR | Status: DC | PRN
  Administered 2021-08-15: 1 mL

## 2021-08-15 MED ORDER — TETRACAINE HCL 0.5 % OP SOLN
1.0000 [drp] | OPHTHALMIC | Status: DC | PRN
  Administered 2021-08-15 (×3): 1 [drp] via OPHTHALMIC

## 2021-08-15 MED ORDER — ARMC OPHTHALMIC DILATING DROPS
1.0000 "application " | OPHTHALMIC | Status: DC | PRN
  Administered 2021-08-15 (×3): 1 via OPHTHALMIC

## 2021-08-15 SURGICAL SUPPLY — 16 items
CANNULA ANT/CHMB 27G (MISCELLANEOUS) IMPLANT
CANNULA ANT/CHMB 27GA (MISCELLANEOUS) IMPLANT
CATARACT SUITE SIGHTPATH (MISCELLANEOUS) ×2 IMPLANT
FEE CATARACT SUITE SIGHTPATH (MISCELLANEOUS) ×1 IMPLANT
GLOVE SURG ENC TEXT LTX SZ8 (GLOVE) ×2 IMPLANT
GLOVE SURG TRIUMPH 8.0 PF LTX (GLOVE) ×2 IMPLANT
LENS IOL TECNIS EYHANCE 21.0 (Intraocular Lens) ×1 IMPLANT
NDL FILTER BLUNT 18X1 1/2 (NEEDLE) ×1 IMPLANT
NEEDLE FILTER BLUNT 18X 1/2SAF (NEEDLE) ×1
NEEDLE FILTER BLUNT 18X1 1/2 (NEEDLE) ×1 IMPLANT
PACK VIT ANT 23G (MISCELLANEOUS) IMPLANT
RING MALYGIN (MISCELLANEOUS) ×1 IMPLANT
SUT ETHILON 10-0 CS-B-6CS-B-6 (SUTURE)
SUTURE EHLN 10-0 CS-B-6CS-B-6 (SUTURE) IMPLANT
SYR 3ML LL SCALE MARK (SYRINGE) ×2 IMPLANT
WATER STERILE IRR 250ML POUR (IV SOLUTION) ×2 IMPLANT

## 2021-08-15 NOTE — Anesthesia Preprocedure Evaluation (Signed)
Anesthesia Evaluation  Patient identified by MRN, date of birth, ID band Patient awake    Reviewed: Allergy & Precautions, NPO status   Airway Mallampati: II  TM Distance: >3 FB     Dental   Pulmonary asthma ,    Pulmonary exam normal        Cardiovascular hypertension,  Rhythm:Regular Rate:Normal     Neuro/Psych    GI/Hepatic hiatal hernia, GERD  ,  Endo/Other  diabetes, Type 2  Renal/GU      Musculoskeletal  (+) Arthritis ,   Abdominal   Peds  Hematology  (+) Blood dyscrasia, anemia ,   Anesthesia Other Findings   Reproductive/Obstetrics                             Anesthesia Physical Anesthesia Plan  ASA: 3  Anesthesia Plan: MAC   Post-op Pain Management: Minimal or no pain anticipated   Induction: Intravenous  PONV Risk Score and Plan: 2 and TIVA, Midazolam and Treatment may vary due to age or medical condition  Airway Management Planned: Natural Airway and Nasal Cannula  Additional Equipment:   Intra-op Plan:   Post-operative Plan:   Informed Consent: I have reviewed the patients History and Physical, chart, labs and discussed the procedure including the risks, benefits and alternatives for the proposed anesthesia with the patient or authorized representative who has indicated his/her understanding and acceptance.       Plan Discussed with: CRNA  Anesthesia Plan Comments:         Anesthesia Quick Evaluation

## 2021-08-15 NOTE — Op Note (Signed)
PREOPERATIVE DIAGNOSIS:  Nuclear sclerotic cataract of the right eye.   POSTOPERATIVE DIAGNOSIS:  Cataract   OPERATIVE PROCEDURE:ORPROCALL@   SURGEON:  Galen Manila, MD.   ANESTHESIA:  Anesthesiologist: Jola Babinski, MD CRNA: Michaele Offer, CRNA  1.      Managed anesthesia care. 2.      0.81ml of Shugarcaine was instilled in the eye following the paracentesis.   COMPLICATIONS: Viscoelastic was used to raise the pupil margin.  A  Malyugin ring was placed as the pupil would not achieve sufficient pharmacologic dilation to undergo cataract extraction safely.( The ring was removed atraumatically following insertion of the IOL.)    TECHNIQUE:   Stop and chop   DESCRIPTION OF PROCEDURE:  The patient was examined and consented in the preoperative holding area where the aforementioned topical anesthesia was applied to the right eye and then brought back to the Operating Room where the right eye was prepped and draped in the usual sterile ophthalmic fashion and a lid speculum was placed. A paracentesis was created with the side port blade and the anterior chamber was filled with viscoelastic. A near clear corneal incision was performed with the steel keratome. A continuous curvilinear capsulorrhexis was performed with a cystotome followed by the capsulorrhexis forceps. Hydrodissection and hydrodelineation were carried out with BSS on a blunt cannula. The lens was removed in a stop and chop  technique and the remaining cortical material was removed with the irrigation-aspiration handpiece. The capsular bag was inflated with viscoelastic and the Technis [id number]  lens was placed in the capsular bag without complication. The remaining viscoelastic was removed from the eye with the irrigation-aspiration handpiece. The wounds were hydrated. The anterior chamber was flushed with BSS and the eye was inflated to physiologic pressure. 0.11ml of Vigamox was placed in the anterior chamber. The wounds were found to  be water tight. The eye was dressed with Combigan. The patient was given protective glasses to wear throughout the day and a shield with which to sleep tonight. The patient was also given drops with which to begin a drop regimen today and will follow-up with me in one day. Implant Name Type Inv. Item Serial No. Manufacturer Lot No. LRB No. Used Action  LENS IOL TECNIS EYHANCE 21.0 - [id number] Intraocular Lens LENS IOL TECNIS EYHANCE 21.0 [id number] [employer]  Right 1 Implanted   Procedure(s) with comments: CATARACT EXTRACTION PHACO AND INTRAOCULAR LENS PLACEMENT (IOC) RIGHT DIABETIC (Right) - 6.40 0:49.8  Electronically signed: Galen Manila 08/15/2021 8:22 AM

## 2021-08-15 NOTE — Transfer of Care (Signed)
Immediate Anesthesia Transfer of Care Note  Patient: Kristie Hoover  Procedure(s) Performed: CATARACT EXTRACTION PHACO AND INTRAOCULAR LENS PLACEMENT (IOC) RIGHT DIABETIC (Right: Eye)  Patient Location: PACU  Anesthesia Type: MAC  Level of Consciousness: awake, alert  and patient cooperative  Airway and Oxygen Therapy: Patient Spontanous Breathing and Patient connected to supplemental oxygen  Post-op Assessment: Post-op Vital signs reviewed, Patient's Cardiovascular Status Stable, Respiratory Function Stable, Patent Airway and No signs of Nausea or vomiting  Post-op Vital Signs: Reviewed and stable  Complications: No notable events documented.

## 2021-08-15 NOTE — Anesthesia Postprocedure Evaluation (Signed)
Anesthesia Post Note  Patient: Kristie Hoover  Procedure(s) Performed: CATARACT EXTRACTION PHACO AND INTRAOCULAR LENS PLACEMENT (IOC) RIGHT DIABETIC (Right: Eye)     Patient location during evaluation: PACU Anesthesia Type: MAC Level of consciousness: awake Pain management: pain level controlled Vital Signs Assessment: post-procedure vital signs reviewed and stable Respiratory status: respiratory function stable Cardiovascular status: stable Postop Assessment: no apparent nausea or vomiting Anesthetic complications: no   No notable events documented.  Veda Canning

## 2021-08-15 NOTE — H&P (Signed)
[facility]   Primary Care Physician:  Jaclyn Shaggy, MD Ophthalmologist: Dr. Druscilla Brownie  Pre-Procedure History & Physical: HPI:  Kristie Hoover is a 71 y.o. female here for cataract surgery.   Past Medical History:  Diagnosis Date   Anemia    Arthritis    Asthma    WELL CONTROLLED   DDD (degenerative disc disease), lumbar    Diabetes mellitus without complication (HCC)    GERD (gastroesophageal reflux disease)    History of hiatal hernia    Hypertension    NO MEDS   Kidney infection    Pneumonia    @ 26 months old   Wears dentures    full upper, partial lower    Past Surgical History:  Procedure Laterality Date   ABDOMINAL HYSTERECTOMY     APPENDECTOMY     CARPAL TUNNEL RELEASE Right    COLONOSCOPY     COLONOSCOPY WITH PROPOFOL N/A 08/19/2017   Procedure: COLONOSCOPY WITH PROPOFOL;  Surgeon: Scot Jun, MD;  Location: [facility] ENDOSCOPY;  Service: Endoscopy;  Laterality: N/A;   CYSTOCELE REPAIR N/A 03/31/2020   Procedure: ANTERIOR REPAIR (CYSTOCELE);  Surgeon: Nadara Mustard, MD;  Location: [facility] ORS;  Service: Gynecology;  Laterality: N/A;   ESOPHAGOGASTRODUODENOSCOPY     ESOPHAGOGASTRODUODENOSCOPY (EGD) WITH PROPOFOL N/A 08/19/2017   Procedure: ESOPHAGOGASTRODUODENOSCOPY (EGD) WITH PROPOFOL;  Surgeon: Scot Jun, MD;  Location: [facility] ENDOSCOPY;  Service: Endoscopy;  Laterality: N/A;   LAMINECTOMY  1998   NO METAL   RECTOCELE REPAIR N/A 03/31/2020   Procedure: POSTERIOR REPAIR (RECTOCELE);  Surgeon: Nadara Mustard, MD;  Location: [facility] ORS;  Service: Gynecology;  Laterality: N/A;   REVERSE SHOULDER ARTHROPLASTY Left 08/11/2020   Procedure: REVERSE SHOULDER ARTHROPLASTY;  Surgeon: Christena Flake, MD;  Location: [facility] ORS;  Service: Orthopedics;  Laterality: Left;   WRIST FUSION Right 1993   METAL PLATE AND SCREWS HAVE BEEN REMOVED    Prior to Admission medications   Medication Sig Start Date End Date Taking? Authorizing Provider  acetaminophen (TYLENOL) 500  MG tablet Take 1,000 mg by mouth every 6 (six) hours as needed for moderate pain or headache.   Yes [provider]  Biotin 1000 MCG CHEW Chew 2,000 mcg by mouth daily.   Yes [provider]  brimonidine-timolol (COMBIGAN) 0.2-0.5 % ophthalmic solution Place 1 drop into both eyes every 12 (twelve) hours.   Yes [provider]  etodolac (LODINE) 400 MG tablet Take 400 mg by mouth 2 (two) times daily.   Yes [provider]  Fluticasone-Salmeterol (ADVAIR) 100-50 MCG/DOSE AEPB Inhale 1 puff into the lungs See admin instructions. Inhale 1 puff twice daily may take a third puff at bedtime as needed for shortness of breath   Yes [provider]  glipiZIDE-metformin (METAGLIP) 2.5-500 MG tablet Take 1 tablet by mouth daily before breakfast.   Yes [provider]  latanoprost (XALATAN) 0.005 % ophthalmic solution Place 1 drop into both eyes at bedtime. 02/02/20  Yes [provider]  montelukast (SINGULAIR) 10 MG tablet Take 10 mg by mouth every morning.   Yes [provider]  Multiple Vitamin (MULTIVITAMIN WITH MINERALS) TABS tablet Take 1 tablet by mouth daily.   Yes [provider]  pantoprazole (PROTONIX) 40 MG tablet Take 40 mg by mouth every morning.   Yes [provider]  potassium chloride SA (K-DUR,KLOR-CON) 20 MEQ tablet Take 20 mEq by mouth 2 (two) times daily.   Yes [provider]  pravastatin (PRAVACHOL) 40 MG tablet Take 40 mg by mouth every morning.   Yes [provider]    Allergies as of 07/21/2021 - Review Complete 06/08/2021  Allergen Reaction Noted   Oxycodone-acetaminophen  07/27/2020   Prednisone Hives 08/16/2017   Tramadol Other (See Comments) 08/16/2017   Chlorhexidine gluconate Itching 06/08/2021   Erythromycin Rash 07/02/2016    Family History  Problem Relation Age of Onset   Breast cancer Other     Social History   Socioeconomic History   Marital status: Single     Spouse name: Not on file   Number of children: Not on file   Years of education: Not on file   Highest education level: Not on file  Occupational History   Occupation: warehouse (socks) industry  Tobacco Use   Smoking status: Never   Smokeless tobacco: Never  Vaping Use   Vaping Use: Never used  Substance and Sexual Activity   Alcohol use: Never   Drug use: Never   Sexual activity: Not Currently  Other Topics Concern   Not on file  Social History Narrative   Patient lives alone in an apartment building.   Feels safe in her home.    Social Determinants of Health   Financial Resource Strain: Not on file  Food Insecurity: Not on file  Transportation Needs: Not on file  Physical Activity: Not on file  Stress: Not on file  Social Connections: Not on file  Intimate Partner Violence: Not on file    Review of Systems: See HPI, otherwise negative ROS  Physical Exam: BP (!) 168/71   Pulse 60   Temp (!) 97.1 F (36.2 C) (Temporal)   Ht 5\' 8"  (1.727 m)   Wt 69.4 kg   SpO2 99%   BMI 23.26 kg/m  General:   Alert, cooperative in NAD Head:  Normocephalic and atraumatic. Respiratory:  Normal work of breathing. Cardiovascular:  RRR  Impression/Plan: Kristie Hoover is here for cataract surgery.  Risks, benefits, limitations, and alternatives regarding cataract surgery have been reviewed with the patient.  Questions have been answered.  All parties agreeable.   Gwinda Maine, MD  08/15/2021, 7:44 AM

## 2021-08-16 ENCOUNTER — Encounter: Payer: Self-pay | Admitting: Ophthalmology

## 2021-08-28 NOTE — Discharge Instructions (Signed)

## 2021-08-29 ENCOUNTER — Ambulatory Visit
Admission: RE | Admit: 2021-08-29 | Discharge: 2021-08-29 | Disposition: A | Discharge status: Home / Self Care | Payer: Medicare Other | Attending: Ophthalmology | Admitting: Ophthalmology

## 2021-08-29 ENCOUNTER — Encounter
Admission: RE | Disposition: A | Discharge status: Home / Self Care | Payer: Self-pay | Source: Home / Self Care | Attending: Ophthalmology

## 2021-08-29 ENCOUNTER — Ambulatory Visit: Payer: Medicare Other | Admitting: Anesthesiology

## 2021-08-29 ENCOUNTER — Other Ambulatory Visit: Payer: Self-pay

## 2021-08-29 ENCOUNTER — Encounter: Payer: Self-pay | Admitting: Ophthalmology

## 2021-08-29 DIAGNOSIS — K219 Gastro-esophageal reflux disease without esophagitis: Secondary | ICD-10-CM | POA: Insufficient documentation

## 2021-08-29 DIAGNOSIS — K449 Diaphragmatic hernia without obstruction or gangrene: Secondary | ICD-10-CM | POA: Insufficient documentation

## 2021-08-29 DIAGNOSIS — D759 Disease of blood and blood-forming organs, unspecified: Secondary | ICD-10-CM | POA: Diagnosis not present

## 2021-08-29 DIAGNOSIS — D649 Anemia, unspecified: Secondary | ICD-10-CM | POA: Diagnosis not present

## 2021-08-29 DIAGNOSIS — E1136 Type 2 diabetes mellitus with diabetic cataract: Secondary | ICD-10-CM | POA: Insufficient documentation

## 2021-08-29 DIAGNOSIS — J45909 Unspecified asthma, uncomplicated: Secondary | ICD-10-CM | POA: Insufficient documentation

## 2021-08-29 DIAGNOSIS — I10 Essential (primary) hypertension: Secondary | ICD-10-CM | POA: Insufficient documentation

## 2021-08-29 DIAGNOSIS — H2512 Age-related nuclear cataract, left eye: Secondary | ICD-10-CM | POA: Insufficient documentation

## 2021-08-29 HISTORY — PX: CATARACT EXTRACTION W/PHACO: SHX586

## 2021-08-29 LAB — GLUCOSE, CAPILLARY
Glucose-Capillary: 129 mg/dL — ABNORMAL HIGH (ref 70–99)
Glucose-Capillary: 127 mg/dL — ABNORMAL HIGH (ref 70–99)

## 2021-08-29 SURGERY — PHACOEMULSIFICATION, CATARACT, WITH IOL INSERTION
Anesthesia: Monitor Anesthesia Care | Site: Eye | Laterality: Left

## 2021-08-29 MED ORDER — ARMC OPHTHALMIC DILATING DROPS
1.0000 "application " | OPHTHALMIC | Status: DC | PRN
  Administered 2021-08-29 (×3): 1 via OPHTHALMIC

## 2021-08-29 MED ORDER — SIGHTPATH DOSE#1 BSS IO SOLN
INTRAOCULAR | Status: DC | PRN
  Administered 2021-08-29: 77 mL via OPHTHALMIC

## 2021-08-29 MED ORDER — ACETAMINOPHEN 325 MG PO TABS: 325.0000 mg | ORAL_TABLET | Freq: Once | ORAL | Status: DC

## 2021-08-29 MED ORDER — LACTATED RINGERS IV SOLN: INTRAVENOUS | Status: DC

## 2021-08-29 MED ORDER — ACETAMINOPHEN 160 MG/5ML PO SOLN: 325.0000 mg | Freq: Once | ORAL | Status: DC

## 2021-08-29 MED ORDER — SIGHTPATH DOSE#1 NA CHONDROIT SULF-NA HYALURON 40-17 MG/ML IO SOLN
INTRAOCULAR | Status: DC | PRN
  Administered 2021-08-29: 1 mL via INTRAOCULAR

## 2021-08-29 MED ORDER — MIDAZOLAM HCL 2 MG/2ML IJ SOLN
INTRAMUSCULAR | Status: DC | PRN
  Administered 2021-08-29: .5 mg via INTRAVENOUS
  Administered 2021-08-29: 1 mg via INTRAVENOUS

## 2021-08-29 MED ORDER — SIGHTPATH DOSE#1 BSS IO SOLN
INTRAOCULAR | Status: DC | PRN
  Administered 2021-08-29: 1 mL via INTRAMUSCULAR

## 2021-08-29 MED ORDER — SIGHTPATH DOSE#1 BSS IO SOLN
INTRAOCULAR | Status: DC | PRN
  Administered 2021-08-29: 15 mL

## 2021-08-29 MED ORDER — MOXIFLOXACIN HCL 0.5 % OP SOLN
OPHTHALMIC | Status: DC | PRN
  Administered 2021-08-29: 0.2 mL via OPHTHALMIC

## 2021-08-29 MED ORDER — BRIMONIDINE TARTRATE-TIMOLOL 0.2-0.5 % OP SOLN
OPHTHALMIC | Status: DC | PRN
  Administered 2021-08-29: 1 [drp] via OPHTHALMIC

## 2021-08-29 MED ORDER — TETRACAINE HCL 0.5 % OP SOLN
1.0000 [drp] | OPHTHALMIC | Status: DC | PRN
  Administered 2021-08-29 (×3): 1 [drp] via OPHTHALMIC

## 2021-08-29 MED ORDER — FENTANYL CITRATE (PF) 100 MCG/2ML IJ SOLN
INTRAMUSCULAR | Status: DC | PRN
  Administered 2021-08-29: 50 ug via INTRAVENOUS

## 2021-08-29 SURGICAL SUPPLY — 11 items
CATARACT SUITE SIGHTPATH (MISCELLANEOUS) ×2 IMPLANT
FEE CATARACT SUITE SIGHTPATH (MISCELLANEOUS) ×1 IMPLANT
GLOVE SURG ENC TEXT LTX SZ8 (GLOVE) ×2 IMPLANT
GLOVE SURG TRIUMPH 8.0 PF LTX (GLOVE) ×2 IMPLANT
LENS IOL TECNIS EYHANCE 21.0 (Intraocular Lens) ×1 IMPLANT
NDL FILTER BLUNT 18X1 1/2 (NEEDLE) ×1 IMPLANT
NEEDLE FILTER BLUNT 18X 1/2SAF (NEEDLE) ×1
NEEDLE FILTER BLUNT 18X1 1/2 (NEEDLE) ×1 IMPLANT
RING MALYGIN (MISCELLANEOUS) ×1 IMPLANT
SYR 3ML LL SCALE MARK (SYRINGE) ×2 IMPLANT
WATER STERILE IRR 250ML POUR (IV SOLUTION) ×2 IMPLANT

## 2021-08-29 NOTE — Anesthesia Preprocedure Evaluation (Signed)
Anesthesia Evaluation  Patient identified by MRN, date of birth, ID band Patient awake    Reviewed: Allergy & Precautions, H&P , NPO status , Patient's Chart, lab work & pertinent test results  Airway Mallampati: II  TM Distance: >3 FB Neck ROM: full    Dental no notable dental hx.    Pulmonary asthma ,    Pulmonary exam normal breath sounds clear to auscultation       Cardiovascular hypertension, Normal cardiovascular exam Rhythm:Regular Rate:Normal     Neuro/Psych    GI/Hepatic hiatal hernia, GERD  ,  Endo/Other  diabetes, Type 2  Renal/GU      Musculoskeletal  (+) Arthritis ,   Abdominal   Peds  Hematology  (+) Blood dyscrasia, anemia ,   Anesthesia Other Findings   Reproductive/Obstetrics                             Anesthesia Physical  Anesthesia Plan  ASA: 3  Anesthesia Plan: MAC   Post-op Pain Management: Minimal or no pain anticipated   Induction: Intravenous  PONV Risk Score and Plan: 2 and TIVA, Midazolam and Treatment may vary due to age or medical condition  Airway Management Planned: Natural Airway and Nasal Cannula  Additional Equipment:   Intra-op Plan:   Post-operative Plan:   Informed Consent: I have reviewed the patients History and Physical, chart, labs and discussed the procedure including the risks, benefits and alternatives for the proposed anesthesia with the patient or authorized representative who has indicated his/her understanding and acceptance.     Dental Advisory Given  Plan Discussed with: CRNA  Anesthesia Plan Comments:         Anesthesia Quick Evaluation

## 2021-08-29 NOTE — Anesthesia Postprocedure Evaluation (Signed)
Anesthesia Post Note  Patient: Kristie Hoover  Procedure(s) Performed: CATARACT EXTRACTION PHACO AND INTRAOCULAR LENS PLACEMENT (IOC) LEFT DIABETIC 8.21 00:52.9 (Left: Eye)     Patient location during evaluation: PACU Anesthesia Type: MAC Level of consciousness: awake and alert and oriented Pain management: satisfactory to patient Vital Signs Assessment: post-procedure vital signs reviewed and stable Respiratory status: spontaneous breathing, nonlabored ventilation and respiratory function stable Cardiovascular status: blood pressure returned to baseline and stable Postop Assessment: Adequate PO intake and No signs of nausea or vomiting Anesthetic complications: no   No notable events documented.  Raliegh Ip

## 2021-08-29 NOTE — Transfer of Care (Signed)
Immediate Anesthesia Transfer of Care Note  Patient: Kristie Hoover  Procedure(s) Performed: CATARACT EXTRACTION PHACO AND INTRAOCULAR LENS PLACEMENT (IOC) LEFT DIABETIC 8.21 00:52.9 (Left: Eye)  Patient Location: PACU  Anesthesia Type: MAC  Level of Consciousness: awake, alert  and patient cooperative  Airway and Oxygen Therapy: Patient Spontanous Breathing and Patient connected to supplemental oxygen  Post-op Assessment: Post-op Vital signs reviewed, Patient's Cardiovascular Status Stable, Respiratory Function Stable, Patent Airway and No signs of Nausea or vomiting  Post-op Vital Signs: Reviewed and stable  Complications: No notable events documented.

## 2021-08-29 NOTE — Anesthesia Procedure Notes (Signed)
Procedure Name: MAC Date/Time: 08/29/2021 8:03 AM  Performed by: Dionne Bucy, CRNAPre-anesthesia Checklist: Patient identified, Emergency Drugs available, Suction available, Patient being monitored and Timeout performed Patient Re-evaluated:Patient Re-evaluated prior to induction Oxygen Delivery Method: Nasal cannula Placement Confirmation: positive ETCO2

## 2021-08-29 NOTE — H&P (Signed)
[facility]   Primary Care Physician:  Jaclyn Shaggy, MD Ophthalmologist: Dr. Druscilla Brownie  Pre-Procedure History & Physical: HPI:  Kristie Hoover is a 71 y.o. female here for cataract surgery.   Past Medical History:  Diagnosis Date   Anemia    Arthritis    Asthma    WELL CONTROLLED   DDD (degenerative disc disease), lumbar    Diabetes mellitus without complication (HCC)    GERD (gastroesophageal reflux disease)    History of hiatal hernia    Hypertension    NO MEDS   Kidney infection    Pneumonia    @ 54 months old   Wears dentures    full upper, partial lower    Past Surgical History:  Procedure Laterality Date   ABDOMINAL HYSTERECTOMY     APPENDECTOMY     CARPAL TUNNEL RELEASE Right    CATARACT EXTRACTION W/PHACO Right 08/15/2021   Procedure: CATARACT EXTRACTION PHACO AND INTRAOCULAR LENS PLACEMENT (IOC) RIGHT DIABETIC;  Surgeon: Galen Manila, MD;  Location: [facility];  Service: Ophthalmology;  Laterality: Right;  6.40 0:49.8   COLONOSCOPY     COLONOSCOPY WITH PROPOFOL N/A 08/19/2017   Procedure: COLONOSCOPY WITH PROPOFOL;  Surgeon: Scot Jun, MD;  Location: [facility] ENDOSCOPY;  Service: Endoscopy;  Laterality: N/A;   CYSTOCELE REPAIR N/A 03/31/2020   Procedure: ANTERIOR REPAIR (CYSTOCELE);  Surgeon: Nadara Mustard, MD;  Location: [facility] ORS;  Service: Gynecology;  Laterality: N/A;   ESOPHAGOGASTRODUODENOSCOPY     ESOPHAGOGASTRODUODENOSCOPY (EGD) WITH PROPOFOL N/A 08/19/2017   Procedure: ESOPHAGOGASTRODUODENOSCOPY (EGD) WITH PROPOFOL;  Surgeon: Scot Jun, MD;  Location: [facility] ENDOSCOPY;  Service: Endoscopy;  Laterality: N/A;   LAMINECTOMY  1998   NO METAL   RECTOCELE REPAIR N/A 03/31/2020   Procedure: POSTERIOR REPAIR (RECTOCELE);  Surgeon: Nadara Mustard, MD;  Location: [facility] ORS;  Service: Gynecology;  Laterality: N/A;   REVERSE SHOULDER ARTHROPLASTY Left 08/11/2020   Procedure: REVERSE SHOULDER ARTHROPLASTY;  Surgeon: Christena Flake, MD;   Location: [facility] ORS;  Service: Orthopedics;  Laterality: Left;   WRIST FUSION Right 1993   METAL PLATE AND SCREWS HAVE BEEN REMOVED    Prior to Admission medications   Medication Sig Start Date End Date Taking? Authorizing Provider  acetaminophen (TYLENOL) 500 MG tablet Take 1,000 mg by mouth every 6 (six) hours as needed for moderate pain or headache.   Yes [provider]  Biotin 1000 MCG CHEW Chew 2,000 mcg by mouth daily.   Yes [provider]  brimonidine-timolol (COMBIGAN) 0.2-0.5 % ophthalmic solution Place 1 drop into both eyes every 12 (twelve) hours.   Yes [provider]  etodolac (LODINE) 400 MG tablet Take 400 mg by mouth 2 (two) times daily.   Yes [provider]  Fluticasone-Salmeterol (ADVAIR) 100-50 MCG/DOSE AEPB Inhale 1 puff into the lungs See admin instructions. Inhale 1 puff twice daily may take a third puff at bedtime as needed for shortness of breath   Yes [provider]  glipiZIDE-metformin (METAGLIP) 2.5-500 MG tablet Take 1 tablet by mouth daily before breakfast.   Yes [provider]  montelukast (SINGULAIR) 10 MG tablet Take 10 mg by mouth every morning.   Yes [provider]  Multiple Vitamin (MULTIVITAMIN WITH MINERALS) TABS tablet Take 1 tablet by mouth daily.   Yes [provider]  pantoprazole (PROTONIX) 40 MG tablet Take 40 mg by mouth every morning.   Yes [provider]  potassium chloride SA (K-DUR,KLOR-CON) 20  MEQ tablet Take 20 mEq by mouth 2 (two) times daily.   Yes [provider]  pravastatin (PRAVACHOL) 40 MG tablet Take 40 mg by mouth every morning.   Yes [provider]  latanoprost (XALATAN) 0.005 % ophthalmic solution Place 1 drop into both eyes at bedtime. 02/02/20   [provider]    Allergies as of 07/21/2021 - Review Complete 06/08/2021  Allergen Reaction Noted   Oxycodone-acetaminophen  07/27/2020   Prednisone Hives 08/16/2017    Tramadol Other (See Comments) 08/16/2017   Chlorhexidine gluconate Itching 06/08/2021   Erythromycin Rash 07/02/2016    Family History  Problem Relation Age of Onset   Breast cancer Other     Social History   Socioeconomic History   Marital status: Single    Spouse name: Not on file   Number of children: Not on file   Years of education: Not on file   Highest education level: Not on file  Occupational History   Occupation: warehouse (socks) industry  Tobacco Use   Smoking status: Never   Smokeless tobacco: Never  Vaping Use   Vaping Use: Never used  Substance and Sexual Activity   Alcohol use: Never   Drug use: Never   Sexual activity: Not Currently  Other Topics Concern   Not on file  Social History Narrative   Patient lives alone in an apartment building.   Feels safe in her home.    Social Determinants of Health   Financial Resource Strain: Not on file  Food Insecurity: Not on file  Transportation Needs: Not on file  Physical Activity: Not on file  Stress: Not on file  Social Connections: Not on file  Intimate Partner Violence: Not on file    Review of Systems: See HPI, otherwise negative ROS  Physical Exam: BP (!) 175/75   Pulse (!) 56   Temp (!) 97.3 F (36.3 C) (Temporal)   Resp 18   Ht 5\' 8"  (1.727 m)   Wt 69.9 kg   SpO2 100%   BMI 23.42 kg/m  General:   Alert, cooperative in NAD Head:  Normocephalic and atraumatic. Respiratory:  Normal work of breathing. Cardiovascular:  RRR  Impression/Plan: Kristie Hoover is here for cataract surgery.  Risks, benefits, limitations, and alternatives regarding cataract surgery have been reviewed with the patient.  Questions have been answered.  All parties agreeable.   Gwinda Maine, MD  08/29/2021, 7:53 AM

## 2021-08-29 NOTE — Op Note (Signed)
PREOPERATIVE DIAGNOSIS:  Nuclear sclerotic cataract of the left eye.   POSTOPERATIVE DIAGNOSIS:  Nuclear sclerotic cataract of the left eye.   OPERATIVE PROCEDURE:ORPROCALL@   SURGEON:  Galen Manila, MD.   ANESTHESIA:  Anesthesiologist: Ranee Gosselin, MD CRNA: Lily Kocher, CRNA  1.      Managed anesthesia care. 2.     0.77ml of Shugarcaine was instilled following the paracentesis   COMPLICATIONS: Viscoelastic was used to raise the pupil margin.  A  Malyugin ring was placed as the pupil would not achieve sufficient pharmacologic dilation to undergo cataract extraction safely.( The ring was removed atraumatically following insertion of the IOL.)    TECHNIQUE:   Stop and chop   DESCRIPTION OF PROCEDURE:  The patient was examined and consented in the preoperative holding area where the aforementioned topical anesthesia was applied to the left eye and then brought back to the Operating Room where the left eye was prepped and draped in the usual sterile ophthalmic fashion and a lid speculum was placed. A paracentesis was created with the side port blade and the anterior chamber was filled with viscoelastic. A near clear corneal incision was performed with the steel keratome. A continuous curvilinear capsulorrhexis was performed with a cystotome followed by the capsulorrhexis forceps. Hydrodissection and hydrodelineation were carried out with BSS on a blunt cannula. The lens was removed in a stop and chop  technique and the remaining cortical material was removed with the irrigation-aspiration handpiece. The capsular bag was inflated with viscoelastic and the Technis [id number] lens was placed in the capsular bag without complication. The remaining viscoelastic was removed from the eye with the irrigation-aspiration handpiece. The wounds were hydrated. The anterior chamber was flushed with BSS and the eye was inflated to physiologic pressure. 0.80ml Vigamox was placed in the anterior chamber. The  wounds were found to be water tight. The eye was dressed with Combigan. The patient was given protective glasses to wear throughout the day and a shield with which to sleep tonight. The patient was also given drops with which to begin a drop regimen today and will follow-up with me in one day. Implant Name Type Inv. Item Serial No. Manufacturer Lot No. LRB No. Used Action  LENS IOL TECNIS EYHANCE 21.0 - [id number] Intraocular Lens LENS IOL TECNIS EYHANCE 21.0 [id number] [employer]  Left 1 Implanted    Procedure(s) with comments: CATARACT EXTRACTION PHACO AND INTRAOCULAR LENS PLACEMENT (IOC) LEFT DIABETIC 8.21 00:52.9 (Left) - Diabetic  Electronically signed: Galen Manila 08/29/2021 8:27 AM

## 2021-08-30 ENCOUNTER — Encounter: Payer: Self-pay | Admitting: Ophthalmology

## 2021-11-03 ENCOUNTER — Other Ambulatory Visit (HOSPITAL_COMMUNITY)
Admission: RE | Admit: 2021-11-03 | Discharge: 2021-11-03 | Disposition: A | Discharge status: Home / Self Care | Payer: Medicare Other | Source: Ambulatory Visit | Attending: Licensed Practical Nurse | Admitting: Licensed Practical Nurse

## 2021-11-03 ENCOUNTER — Ambulatory Visit: Payer: Medicare Other

## 2021-11-03 VITALS — BP 120/80 | Ht 68.0 in | Wt 159.0 lb

## 2021-11-03 DIAGNOSIS — B3731 Acute candidiasis of vulva and vagina: Secondary | ICD-10-CM | POA: Insufficient documentation

## 2021-11-03 MED ORDER — FLUCONAZOLE 150 MG PO TABS: 150.0000 mg | ORAL_TABLET | Freq: Once | ORAL | 0 refills | Status: AC

## 2021-11-03 NOTE — Progress Notes (Signed)
She complains of vaginal discharge for 10 days; described as itchy, Wen[place], non bloody, without pelvic pain or abnormal vaginal bleeding.

## 2021-11-06 LAB — CERVICOVAGINAL ANCILLARY ONLY
Candida Glabrata: NEGATIVE
Candida Vaginitis: NEGATIVE
Comment: NEGATIVE
Comment: NEGATIVE

## 2022-03-05 ENCOUNTER — Other Ambulatory Visit: Payer: Self-pay | Admitting: Surgery

## 2022-03-14 ENCOUNTER — Inpatient Hospital Stay
Admission: RE | Admit: 2022-03-14 | Discharge: 2022-03-14 | Disposition: A | Discharge status: Home / Self Care | Payer: Medicare Other | Source: Ambulatory Visit

## 2022-03-14 NOTE — Patient Instructions (Signed)
Your procedure is scheduled on: Wednesday March 21, 2022. Report to [facility] inside [facility] 2nd floor, stop by registration desk before getting on elevator.  To find out your arrival time please call [phone] between 1PM - 3PM on Tuesday March 20, 2022.  Remember: Instructions that are not followed completely may result in serious medical risk,  up to and including death, or upon the discretion of your surgeon and anesthesiologist your  surgery may need to be rescheduled.     _X__ 1. Do not eat food after midnight the night before your procedure.                 No chewing gum or hard candies. You may drink clear liquids up to 2 hours                 before you are scheduled to arrive for your surgery- DO not drink clear                 liquids within 2 hours of the start of your surgery.                 Clear Liquids include:  water, apple juice without pulp, clear Gatorade, G2 or                  Gatorade Zero (avoid Red/Purple/Blue), Black Coffee or Tea (Do not add                 anything to coffee or tea). __X__2.   Complete the "Ensure Clear Pre-surgery Clear Carbohydrate Drink" provided to you, 2 hours before arrival. **If you are diabetic you will be provided with an alternative drink, Gatorade Zero or G2.  __X__3.  On the morning of surgery brush your teeth with toothpaste and water, you                may rinse your mouth with mouthwash if you wish.  Do not swallow any toothpaste of mouthwash.     _X__ 4.  No Alcohol for 24 hours before or after surgery.   _X__ 5.  Do Not Smoke or use e-cigarettes For 24 Hours Prior to Your Surgery.                 Do not use any chewable tobacco products for at least 6 hours prior to                 Surgery.  _X__  6.  Do not use any recreational drugs (marijuana, cocaine, heroin, ecstasy, MDMA or other)                For at least one week prior to your surgery.  Combination of these drugs with  anesthesia                May have life threatening results.  ____  7.  Bring all medications with you on the day of surgery if instructed.   __X_ 8.  Notify your doctor if there is any change in your medical condition      (cold, fever, infections).     Do not wear jewelry, make-up, hairpins, clips or nail polish. Do not wear lotions, powders, or perfumes. You may wear deodorant. Do not shave 48 hours prior to surgery. Men may shave face and neck. Do not bring valuables to the hospital.    [facility] is not responsible for any belongings or valuables.  Contacts,  dentures or bridgework may not be worn into surgery. Leave your suitcase in the car. After surgery it may be brought to your room. For patients admitted to the hospital, discharge time is determined by your treatment team.   Patients discharged the day of surgery will not be allowed to drive home.   Make arrangements for someone to be with you for the first 24 hours of your Same Day Discharge.   __X__ Take these medicines the morning of surgery with A SIP OF WATER:    1. pantoprazole (PROTONIX) 40 MG   2.   3.   4.  5.  6.  ____ Fleet Enema (as directed)   __X__ Use CHG Soap (or wipes) as directed  ____ Use Benzoyl Peroxide Gel as instructed  __X__ Use inhalers on the day of surgery  Fluticasone-Salmeterol (ADVAIR) 100-50 MCG/DOSE AEPB   ____ Stop metformin 2 days prior to surgery    ____ Take 1/2 of usual insulin dose the night before surgery. No insulin the morning          of surgery.   ____ Call your PCP, cardiologist, or Pulmonologist if taking Coumadin/Plavix/aspirin and ask when to stop before your surgery.   __X__ One Week prior to surgery- Stop Anti-inflammatories such as Ibuprofen, Aleve, Advil, Motrin, meloxicam (MOBIC), diclofenac, etodolac, ketorolac, Toradol, Daypro, piroxicam, Goody's or BC powders. OK TO USE TYLENOL IF NEEDED   __X__ Stop supplements until after surgery. Biotin 1000 MCG     ____ Bring C-Pap to the hospital.    If you have any questions regarding your pre-procedure instructions,  Please call Pre-admit Testing at [phone]    Preparing for Surgery with CHLORHEXIDINE GLUCONATE (CHG) Soap  Chlorhexidine Gluconate (CHG) Soap  o An antiseptic cleaner that kills germs and bonds with the skin to continue killing germs even after washing  o Used for showering the night before surgery and morning of surgery  Before surgery, you can play an important role by reducing the number of germs on your skin.  CHG (Chlorhexidine gluconate) soap is an antiseptic cleanser which kills germs and bonds with the skin to continue killing germs even after washing.  Please do not use if you have an allergy to CHG or antibacterial soaps. If your skin becomes reddened/irritated stop using the CHG.  1. Shower the NIGHT BEFORE SURGERY and the MORNING OF SURGERY with CHG soap.  2. If you choose to wash your hair, wash your hair first as usual with your normal shampoo.  3. After shampooing, rinse your hair and body thoroughly to remove the shampoo.  4. Use CHG as you would any other liquid soap. You can apply CHG directly to the skin and wash gently with a scrungie or a clean washcloth.  5. Apply the CHG soap to your body only from the neck down. Do not use on open wounds or open sores. Avoid contact with your eyes, ears, mouth, and genitals (private parts). Wash face and genitals (private parts) with your normal soap.  6. Wash thoroughly, paying special attention to the area where your surgery will be performed.  7. Thoroughly rinse your body with warm water.  8. Do not shower/wash with your normal soap after using and rinsing off the CHG soap.  9. Pat yourself dry with a clean towel.  10. Wear clean pajamas to bed the night before surgery.  12. Place clean sheets on your bed the night of your first shower and do not sleep with pets.  13. Shower again with the CHG soap  on the day of surgery prior to arriving at the hospital.  14. Do not apply any deodorants/lotions/powders.  15. Please wear clean clothes to the hospital.

## 2022-03-15 ENCOUNTER — Other Ambulatory Visit: Payer: Medicare Other

## 2022-03-16 ENCOUNTER — Inpatient Hospital Stay
Admission: RE | Admit: 2022-03-16 | Discharge: 2022-03-16 | Disposition: A | Discharge status: Home / Self Care | Payer: Medicare Other | Source: Ambulatory Visit

## 2022-03-16 NOTE — Pre-Procedure Instructions (Signed)
Attempted to call this patient for 3 days, no answer and no VM. Called surgeon's office and spoke with Tiffany to advise that not able to get this patient scheduled for surgery 03/21/22 with dr. Joice Lofts for Carpal Tunnel Release. Tiffany will notify dr. Joice Lofts and nurse she stated that nurse will try to call patient Tuesday, if not able to get a hold of patient will cancel the surgery.

## 2022-03-20 MED ORDER — SODIUM CHLORIDE 0.9 % IV SOLN: INTRAVENOUS | Status: DC

## 2022-03-21 ENCOUNTER — Encounter: Payer: Self-pay | Admitting: Anesthesiology

## 2022-03-21 ENCOUNTER — Ambulatory Visit: Admission: RE | Admit: 2022-03-21 | Payer: 59 | Source: Ambulatory Visit | Admitting: Surgery

## 2022-03-21 ENCOUNTER — Other Ambulatory Visit: Payer: Self-pay | Admitting: Internal Medicine

## 2022-03-21 ENCOUNTER — Encounter: Payer: Self-pay | Admitting: Surgery

## 2022-03-21 ENCOUNTER — Encounter: Admission: RE | Payer: Self-pay | Source: Ambulatory Visit

## 2022-03-21 DIAGNOSIS — Z1231 Encounter for screening mammogram for malignant neoplasm of breast: Secondary | ICD-10-CM

## 2022-03-21 DIAGNOSIS — Z96612 Presence of left artificial shoulder joint: Secondary | ICD-10-CM

## 2022-03-21 SURGERY — RELEASE, CARPAL TUNNEL, ENDOSCOPIC
Anesthesia: Choice | Site: Wrist | Laterality: Left

## 2022-03-21 MED ORDER — MIDAZOLAM HCL 2 MG/2ML IJ SOLN
INTRAMUSCULAR | Status: AC
  Filled 2022-03-21: qty 2

## 2022-03-21 MED ORDER — DEXAMETHASONE SODIUM PHOSPHATE 10 MG/ML IJ SOLN
INTRAMUSCULAR | Status: AC
  Filled 2022-03-21: qty 1

## 2022-03-21 MED ORDER — ONDANSETRON HCL 4 MG/2ML IJ SOLN
INTRAMUSCULAR | Status: AC
  Filled 2022-03-21: qty 2

## 2022-03-21 MED ORDER — LIDOCAINE HCL (PF) 2 % IJ SOLN
INTRAMUSCULAR | Status: AC
  Filled 2022-03-21: qty 5

## 2022-03-21 MED ORDER — PROPOFOL 10 MG/ML IV BOLUS
INTRAVENOUS | Status: AC
  Filled 2022-03-21: qty 20

## 2022-03-21 MED ORDER — FENTANYL CITRATE (PF) 100 MCG/2ML IJ SOLN
INTRAMUSCULAR | Status: AC
  Filled 2022-03-21: qty 2

## 2022-03-21 NOTE — H&P (Signed)
History of Present Illness:  Kristie Hoover is a 72 y.o. female who presents for follow-up of her left hand pain and paresthesias secondary to previously diagnosed carpal tunnel syndrome. The patient was last seen for the symptoms 6 months ago. At this visit, we have discussed surgical intervention, specifically an endoscopic carpal tunnel release, but she had to cancel the surgery due to other issues. The patient continues to note mild-moderate pain and paresthesias to the left hand. She rates her pain at 2/10 on today's visit. Her symptoms are aggravated by any repetitive activities, as well as at night. She continues to take Lodine for discomfort with limited benefit. She denies any reinjury to her wrist.  Current Outpatient Medications: aspirin 81 MG EC tablet Take 81 mg by mouth once daily  biotin 10 mg Tab Take by mouth ug Take 20,000 mcg by mouth daily.  brimonidine (ALPHAGAN) 0.2 % ophthalmic solution Apply 1 drop to eye 2 (two) times daily  cyclobenzaprine (FLEXERIL) 10 MG tablet Take 1 tablet (10 mg total) by mouth 3 (three) times daily as needed  etodolac (LODINE) 400 MG tablet Take 1 tablet (400 mg total) by mouth 2 (two) times daily  fluticasone propion-salmeteroL (ADVAIR DISKUS) 100-50 mcg/dose diskus inhaler Inhale into the lungs Inhale 1 puff into the lungs See admin instructions. Inhale 1 puff twice daily may take a third puff at bedtime as needed for shortness of breath  FUROsemide (LASIX) 40 MG tablet Take 1 tablet (40 mg total) by mouth once daily  glipiZIDE-metFORMIN (METAGLIP) 2.5-500 mg tablet Take 1 tablet by mouth daily before breakfast.  latanoprost (XALATAN) 0.005 % ophthalmic solution Apply 1 drop to eye nightly  montelukast (SINGULAIR) 10 mg tablet Take 10 mg by mouth every morning.  pantoprazole (PROTONIX) 40 MG DR tablet Take 40 mg by mouth every morning  potassium chloride (K-DUR,KLOR-CON) 20 MEQ ER tablet Take 20 mEq by mouth 2 (two) times daily.  pravastatin  (PRAVACHOL) 40 MG tablet Take 40 mg by mouth every morning.  timolol hemihydrate (BETIMOL) 0.5 % ophthalmic solution Apply 1 drop to eye 2 (two) times daily   Allergies:  Oxycodone-Acetaminophen (Hallucinations)  Prednisone (Hives, Hypertension)  Chlorhexidine Gluconate/CHG soap (Itching)  Tramadol (Hallucination)  Erythromycin (Rash)   Past Medical History:  Arthritis  Asthma  GERD (gastroesophageal reflux disease) 03/01/2017  Hypertension   Past Surgical History:  APPENDECTOMY 1983  HYSTERECTOMY VAGINAL 12/1981  WRIST FUSION [place]  COLONOSCOPY 11/22/2006 (Int Hemorrhoids, Diverticulosis: [facility])  EGD 11/22/2006 (No repeat per RTE)  EGD 08/19/2017 (Normal: No repeat per RTE)  COLONOSCOPY 08/19/2017 (Diverticulosis, Internal hemorrhoids: [facility] 08/2027)  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 Roland Rack)  ENDOSCOPIC CARPAL TUNNEL RELEASE   Family History:  Stroke Mother  Lupus Sister  Cardiomyopathy (Abnormal function of the heart muscle) Sister  Breast cancer Other  Diabetes Sister  Alzheimer's disease Sister   Social History:   Socioeconomic History:  Marital status: Single  Tobacco Use  Smoking status: Never  Smokeless tobacco: Never  Vaping Use  Vaping Use: Never used  Substance and Sexual Activity  Alcohol use: Never  Drug use: Never   Review of Systems:  A comprehensive 14 point ROS was performed, reviewed, and the pertinent orthopaedic findings are documented in the HPI.  Physical Exam: Vitals:  12/11/21 1117  BP: (!) 132/92  Weight: 73.7 kg (162 lb 6.4 oz)  Height: 172.7 cm (5\' 8" )  PainSc: 2  PainLoc: Hand   General/Constitutional: The patient appears  to be well-nourished, well-developed, and in no acute distress. Neuro/Psych: Normal mood and affect, oriented to person, place and time.  Left wrist/hand exam: Skin inspection of the left wrist and hand remains unremarkable. No swelling, erythema,  ecchymosis, abrasions, or other skin abnormalities are identified. She exhibits full active and passive range of motion of the wrist without any pain or catching. She is able to actively flex and extend all digits fully without any pain or triggering. She also can oppose her thumb to the base of the little finger. She demonstrates a negative Phalen's test, but again has an equivocally positive Tinel's over the carpal tunnel. She is neurovascularly intact to the left upper extremity and hand.  Assessment: 1. Carpal tunnel syndrome, left.   Plan: The treatment options were discussed with the patient. In addition, patient educational materials were provided regarding the diagnosis and treatment options. The patient is quite frustrated by her continued symptoms and function limitations, and is ready to consider more aggressive treatment options. Therefore, I have recommended a surgical procedure, specifically an endoscopic left carpal tunnel release. The procedure was discussed with the patient, as were the potential risks (including bleeding, infection, nerve and/or blood vessel injury, persistent or recurrent pain/paresthesias, weakness of grip, need for further surgery, blood clots, strokes, heart attacks and/or arhythmias, pneumonia, etc.) and benefits. The patient states his/her understanding and wishes to proceed. All of the patient's questions and concerns were answered. She can call any time with further concerns. She will follow up post-surgery, routine.    H&P reviewed and patient re-examined. No changes.

## 2022-03-21 NOTE — Progress Notes (Signed)
Patient has not showed up at this time for scheduled surgery. Attempted to call patient contact number listed in the system which is [phone] several times but unsuccessful. Surgeon's office has been notified as well of the situation.

## 2022-04-06 ENCOUNTER — Ambulatory Visit
Admission: RE | Admit: 2022-04-06 | Discharge: 2022-04-06 | Disposition: A | Discharge status: Home / Self Care | Payer: 59 | Source: Ambulatory Visit | Attending: Internal Medicine | Admitting: Internal Medicine

## 2022-04-06 DIAGNOSIS — Z1231 Encounter for screening mammogram for malignant neoplasm of breast: Secondary | ICD-10-CM | POA: Insufficient documentation

## 2022-05-23 ENCOUNTER — Other Ambulatory Visit: Payer: Self-pay | Admitting: Surgery

## 2022-05-25 ENCOUNTER — Encounter: Payer: Self-pay | Admitting: Urgent Care

## 2022-05-25 ENCOUNTER — Encounter: Payer: Self-pay | Admitting: Anesthesiology

## 2022-05-28 ENCOUNTER — Encounter
Admission: RE | Admit: 2022-05-28 | Discharge: 2022-05-28 | Disposition: A | Discharge status: Home / Self Care | Payer: 59 | Source: Ambulatory Visit | Attending: Surgery | Admitting: Surgery

## 2022-05-28 DIAGNOSIS — Z01812 Encounter for preprocedural laboratory examination: Secondary | ICD-10-CM

## 2022-05-28 DIAGNOSIS — Z01818 Encounter for other preprocedural examination: Secondary | ICD-10-CM

## 2022-05-28 DIAGNOSIS — Z01810 Encounter for preprocedural cardiovascular examination: Secondary | ICD-10-CM

## 2022-05-28 DIAGNOSIS — I10 Essential (primary) hypertension: Secondary | ICD-10-CM

## 2022-05-28 HISTORY — DX: Pure hypercholesterolemia, unspecified: E78.00

## 2022-05-28 NOTE — Patient Instructions (Addendum)
Your procedure is scheduled on:06-05-22 Tuesday Report to the Registration Desk on the 1st floor of the [facility]. Then proceed to the 2nd floor Surgery Desk To find out your arrival time, please call [phone] between 1PM - 3PM on:06-04-22 Monday If your arrival time is 6:00 am, do not arrive before that time as the [facility] entrance doors do not open until 6:00 am.  REMEMBER: Instructions that are not followed completely may result in serious medical risk, up to and including death; or upon the discretion of your surgeon and anesthesiologist your surgery may need to be rescheduled.  Do not eat food after midnight the night before surgery.  No gum chewing or hard candies.  You may however, drink Water up to 2 hours before you are scheduled to arrive for your surgery. Do not drink anything within 2 hours of your scheduled arrival time  In addition, your doctor has ordered for you to drink the provided:  Gatorade G2 Drinking this carbohydrate drink up to two hours before surgery helps to reduce insulin resistance and improve patient outcomes. Please complete drinking 2 hours before scheduled arrival time.  One week prior to surgery:Last dose will be on 05-28-22 Stop Anti-inflammatories (NSAIDS) such asetodolac (LODINE) , Advil, Aleve, Ibuprofen, Motrin, Naproxen, Naprosyn and Aspirin based products such as Excedrin, Goody's Powder, BC Powder.You may however, continue to take Tylenol if needed for pain up until the day of surgery.  Stop ANY OVER THE COUNTER supplements/vitamins NOW (05-28-22) until after surgery (Biotin and Multivitamin)-Continue your potassium chloride SA (K-DUR,KLOR-CON) and take your Potassium the morning of your surgery  TAKE ONLY THESE MEDICATIONS THE MORNING OF SURGERY WITH A SIP OF WATER: -pravastatin (PRAVACHOL)  -montelukast (SINGULAIR)  -potassium chloride SA (K-DUR,KLOR-CON)  -cyclobenzaprine (FLEXERIL)  -pantoprazole (PROTONIX)-take one the night before  surgery and one the morning of surgery  Use your Fluticasone-Salmeterol (ADVAIR) Inhaler the day of surgery  Stop your glipiZIDE-metformin (METAGLIP) 2 days prior to surgery-Last dose will be on 06-02-22 Saturday  No Alcohol for 24 hours before or after surgery.  No Smoking including e-cigarettes for 24 hours before surgery.  No chewable tobacco products for at least 6 hours before surgery.  No nicotine patches on the day of surgery.  Do not use any "recreational" drugs for at least a week (preferably 2 weeks) before your surgery.  Please be advised that the combination of cocaine and anesthesia may have negative outcomes, up to and including death. If you test positive for cocaine, your surgery will be cancelled.  On the morning of surgery brush your teeth with toothpaste and water, you may rinse your mouth with mouthwash if you wish. Do not swallow any toothpaste or mouthwash.  Do not wear jewelry, make-up, hairpins, clips or nail polish.  Do not wear lotions, powders, or perfumes.   Do not shave body hair from the neck down 48 hours before surgery.  Contact lenses, hearing aids and dentures may not be worn into surgery.  Do not bring valuables to the hospital. [facility] is not responsible for any missing/lost belongings or valuables.   Notify your doctor if there is any change in your medical condition (cold, fever, infection).  Wear comfortable clothing (specific to your surgery type) to the hospital.  After surgery, you can help prevent lung complications by doing breathing exercises.  Take deep breaths and cough every 1-2 hours. Your doctor may order a device called an Incentive Spirometer to help you take deep breaths. When coughing or  sneezing, hold a pillow firmly against your incision with both hands. This is called "splinting." Doing this helps protect your incision. It also decreases belly discomfort.  If you are being admitted to the hospital overnight, leave your  suitcase in the car. After surgery it may be brought to your room.  In case of increased patient census, it may be necessary for you, the patient, to continue your postoperative care in the Same Day Surgery department.  If you are being discharged the day of surgery, you will not be allowed to drive home. You will need a responsible individual to drive you home and stay with you for 24 hours after surgery.   If you are taking public transportation, you will need to have a responsible individual with you.  Please call the [facility] at [phone] if you have any questions about these instructions.  Surgery Visitation Policy:  Patients undergoing a surgery or procedure may have two family members or support persons with them as long as the person is not COVID-19 positive or experiencing its symptoms.   Due to an increase in RSV and influenza rates and associated hospitalizations, children ages 42 and under will not be able to visit patients in [facility]. Masks continue to be strongly recommended.    How to Use an Incentive Spirometer An incentive spirometer is a tool that measures how well you are filling your lungs with each breath. Learning to take long, deep breaths using this tool can help you keep your lungs clear and active. This may help to reverse or lessen your chance of developing breathing (pulmonary) problems, especially infection. You may be asked to use a spirometer: After a surgery. If you have a lung problem or a history of smoking. After a long period of time when you have been unable to move or be active. If the spirometer includes an indicator to show the highest number that you have reached, your health care provider or respiratory therapist will help you set a goal. Keep a log of your progress as told by your health care provider. What are the risks? Breathing too quickly may cause dizziness or cause you to pass out. Take your time so  you do not get dizzy or light-headed. If you are in pain, you may need to take pain medicine before doing incentive spirometry. It is harder to take a deep breath if you are having pain. How to use your incentive spirometer  Sit up on the edge of your bed or on a chair. Hold the incentive spirometer so that it is in an upright position. Before you use the spirometer, breathe out normally. Place the mouthpiece in your mouth. Make sure your lips are closed tightly around it. Breathe in slowly and as deeply as you can through your mouth, causing the piston or the ball to rise toward the top of the chamber. Hold your breath for 3-5 seconds, or for as long as possible. If the spirometer includes a coach indicator, use this to guide you in breathing. Slow down your breathing if the indicator goes above the marked areas. Remove the mouthpiece from your mouth and breathe out normally. The piston or ball will return to the bottom of the chamber. Rest for a few seconds, then repeat the steps 10 or more times. Take your time and take a few normal breaths between deep breaths so that you do not get dizzy or light-headed. Do this every 1-2 hours when you are  awake. If the spirometer includes a goal marker to show the highest number you have reached (best effort), use this as a goal to work toward during each repetition. After each set of 10 deep breaths, cough a few times. This will help to make sure that your lungs are clear. If you have an incision on your chest or abdomen from surgery, place a pillow or a rolled-up towel firmly against the incision when you cough. This can help to reduce pain while taking deep breaths and coughing. General tips When you are able to get out of bed: Walk around often. Continue to take deep breaths and cough in order to clear your lungs. Keep using the incentive spirometer until your health care provider says it is okay to stop using it. If you have been in the hospital,  you may be told to keep using the spirometer at home. Contact a health care provider if: You are having difficulty using the spirometer. You have trouble using the spirometer as often as instructed. Your pain medicine is not giving enough relief for you to use the spirometer as told. You have a fever. Get help right away if: You develop shortness of breath. You develop a cough with bloody mucus from the lungs. You have fluid or blood coming from an incision site after you cough. Summary An incentive spirometer is a tool that can help you learn to take long, deep breaths to keep your lungs clear and active. You may be asked to use a spirometer after a surgery, if you have a lung problem or a history of smoking, or if you have been inactive for a long period of time. Use your incentive spirometer as instructed every 1-2 hours while you are awake. If you have an incision on your chest or abdomen, place a pillow or a rolled-up towel firmly against your incision when you cough. This will help to reduce pain. Get help right away if you have shortness of breath, you cough up bloody mucus, or blood comes from your incision when you cough. This information is not intended to replace advice given to you by your health care provider. Make sure you discuss any questions you have with your health care provider. Document Revised: 05/25/2019 Document Reviewed: 05/25/2019 Elsevier Patient Education  [place].

## 2022-05-29 ENCOUNTER — Inpatient Hospital Stay: Admission: RE | Admit: 2022-05-29 | Payer: 59 | Source: Ambulatory Visit

## 2022-05-30 ENCOUNTER — Inpatient Hospital Stay: Admission: RE | Admit: 2022-05-30 | Payer: 59 | Source: Ambulatory Visit

## 2022-05-31 ENCOUNTER — Encounter
Admission: RE | Admit: 2022-05-31 | Discharge: 2022-05-31 | Disposition: A | Discharge status: Home / Self Care | Payer: 59 | Source: Ambulatory Visit | Attending: Surgery | Admitting: Surgery

## 2022-05-31 DIAGNOSIS — Z01812 Encounter for preprocedural laboratory examination: Secondary | ICD-10-CM

## 2022-05-31 DIAGNOSIS — I10 Essential (primary) hypertension: Secondary | ICD-10-CM | POA: Insufficient documentation

## 2022-05-31 DIAGNOSIS — Z01818 Encounter for other preprocedural examination: Secondary | ICD-10-CM | POA: Diagnosis present

## 2022-05-31 DIAGNOSIS — Z01810 Encounter for preprocedural cardiovascular examination: Secondary | ICD-10-CM

## 2022-05-31 LAB — BASIC METABOLIC PANEL
Anion gap: 10 (ref 5–15)
BUN: 11 mg/dL (ref 8–23)
CO2: 29 mmol/L (ref 22–32)
Calcium: 9.7 mg/dL (ref 8.9–10.3)
Chloride: 104 mmol/L (ref 98–111)
Creatinine, Ser: 0.64 mg/dL (ref 0.44–1.00)
GFR, Estimated: >60 mL/min (ref >60)
Glucose, Bld: 71 mg/dL (ref 70–99)
Potassium: 3.5 mmol/L (ref 3.5–5.1)
Sodium: 143 mmol/L (ref 135–145)

## 2022-05-31 LAB — CBC
HCT: 40.7 % (ref 36.0–46.0)
Hemoglobin: 13 g/dL (ref 12.0–15.0)
MCH: 29.5 pg (ref 26.0–34.0)
MCHC: 31.9 g/dL (ref 30.0–36.0)
MCV: 92.5 fL (ref 80.0–100.0)
Platelets: 250 10*3/uL (ref 150–400)
RBC: 4.4 MIL/uL (ref 3.87–5.11)
RDW: 15 % (ref 11.5–15.5)
WBC: 6.7 10*3/uL (ref 4.0–10.5)
nRBC: 0 % (ref 0.0–0.2)

## 2022-06-05 ENCOUNTER — Ambulatory Visit
Admission: RE | Admit: 2022-06-05 | Discharge: 2022-06-05 | Disposition: A | Discharge status: Home / Self Care | Payer: 59 | Source: Ambulatory Visit | Attending: Surgery | Admitting: Surgery

## 2022-06-05 ENCOUNTER — Encounter: Payer: Self-pay | Admitting: Surgery

## 2022-06-05 ENCOUNTER — Other Ambulatory Visit: Payer: Self-pay

## 2022-06-05 ENCOUNTER — Encounter
Admission: RE | Disposition: A | Discharge status: Home / Self Care | Payer: Self-pay | Source: Ambulatory Visit | Attending: Surgery

## 2022-06-05 DIAGNOSIS — K219 Gastro-esophageal reflux disease without esophagitis: Secondary | ICD-10-CM | POA: Diagnosis not present

## 2022-06-05 DIAGNOSIS — J45909 Unspecified asthma, uncomplicated: Secondary | ICD-10-CM | POA: Insufficient documentation

## 2022-06-05 DIAGNOSIS — Z7984 Long term (current) use of oral hypoglycemic drugs: Secondary | ICD-10-CM | POA: Diagnosis not present

## 2022-06-05 DIAGNOSIS — E119 Type 2 diabetes mellitus without complications: Secondary | ICD-10-CM | POA: Diagnosis not present

## 2022-06-05 DIAGNOSIS — G5602 Carpal tunnel syndrome, left upper limb: Secondary | ICD-10-CM | POA: Insufficient documentation

## 2022-06-05 DIAGNOSIS — Z538 Procedure and treatment not carried out for other reasons: Secondary | ICD-10-CM | POA: Insufficient documentation

## 2022-06-05 DIAGNOSIS — I10 Essential (primary) hypertension: Secondary | ICD-10-CM | POA: Insufficient documentation

## 2022-06-05 LAB — GLUCOSE, CAPILLARY: Glucose-Capillary: 103 mg/dL — ABNORMAL HIGH (ref 70–99)

## 2022-06-05 SURGERY — RELEASE, CARPAL TUNNEL, ENDOSCOPIC
Anesthesia: Choice | Site: Wrist | Laterality: Left

## 2022-06-05 MED ORDER — MIDAZOLAM HCL 2 MG/2ML IJ SOLN
INTRAMUSCULAR | Status: AC
  Filled 2022-06-05: qty 2

## 2022-06-05 MED ORDER — FENTANYL CITRATE (PF) 100 MCG/2ML IJ SOLN
INTRAMUSCULAR | Status: AC
  Filled 2022-06-05: qty 2

## 2022-06-05 MED ORDER — SODIUM CHLORIDE 0.9 % IV SOLN: INTRAVENOUS | Status: DC

## 2022-06-05 MED ORDER — CEFAZOLIN SODIUM-DEXTROSE 2-4 GM/100ML-% IV SOLN: 2.0000 g | INTRAVENOUS | Status: DC

## 2022-06-05 MED ORDER — PROPOFOL 10 MG/ML IV BOLUS
INTRAVENOUS | Status: AC
  Filled 2022-06-05: qty 20

## 2022-06-05 SURGICAL SUPPLY — 34 items
APL PRP STRL LF DISP 70% ISPRP (MISCELLANEOUS) ×1
BNDG COHESIVE 4X5 TAN STRL LF (GAUZE/BANDAGES/DRESSINGS) ×1 IMPLANT
BNDG ELASTIC 2X5.8 VLCR STR LF (GAUZE/BANDAGES/DRESSINGS) ×1 IMPLANT
BNDG ESMARCH 4 X 12 STRL LF (GAUZE/BANDAGES/DRESSINGS) ×1
BNDG ESMARCH 4X12 STRL LF (GAUZE/BANDAGES/DRESSINGS) ×1 IMPLANT
CHLORAPREP W/TINT 26 (MISCELLANEOUS) ×1 IMPLANT
CORD BIP STRL DISP 12FT (MISCELLANEOUS) ×1 IMPLANT
CUFF TOURN SGL QUICK 18X4 (TOURNIQUET CUFF) ×1 IMPLANT
DRAPE SURG 17X11 SM STRL (DRAPES) ×1 IMPLANT
FORCEPS JEWEL BIP 4-3/4 STR (INSTRUMENTS) ×1 IMPLANT
GAUZE SPONGE 4X4 12PLY STRL (GAUZE/BANDAGES/DRESSINGS) ×1 IMPLANT
GAUZE XEROFORM 1X8 LF (GAUZE/BANDAGES/DRESSINGS) ×1 IMPLANT
GLOVE BIO SURGEON STRL SZ8 (GLOVE) ×1 IMPLANT
GLOVE INDICATOR 8.0 STRL GRN (GLOVE) ×1 IMPLANT
GOWN STRL REUS W/ TWL LRG LVL3 (GOWN DISPOSABLE) ×1 IMPLANT
GOWN STRL REUS W/ TWL XL LVL3 (GOWN DISPOSABLE) ×1 IMPLANT
GOWN STRL REUS W/TWL LRG LVL3 (GOWN DISPOSABLE) ×1
GOWN STRL REUS W/TWL XL LVL3 (GOWN DISPOSABLE) ×1
KIT CARPAL TUNNEL (MISCELLANEOUS) ×1
KIT ESCP INSRT D SLOT CANN KN (MISCELLANEOUS) ×1 IMPLANT
KIT TURNOVER KIT A (KITS) ×1 IMPLANT
MANIFOLD NEPTUNE II (INSTRUMENTS) ×1 IMPLANT
NS IRRIG 500ML POUR BTL (IV SOLUTION) ×1 IMPLANT
PACK EXTREMITY ARMC (MISCELLANEOUS) ×1 IMPLANT
SPLINT WRIST LG LT TX990309 (SOFTGOODS) IMPLANT
SPLINT WRIST LG RT TX900304 (SOFTGOODS) IMPLANT
SPLINT WRIST M LT TX990308 (SOFTGOODS) IMPLANT
SPLINT WRIST M RT TX990303 (SOFTGOODS) IMPLANT
SPLINT WRIST XL LT TX990310 (SOFTGOODS) IMPLANT
SPLINT WRIST XL RT TX990305 (SOFTGOODS) IMPLANT
STOCKINETTE IMPERVIOUS 9X36 MD (GAUZE/BANDAGES/DRESSINGS) ×1 IMPLANT
SUT PROLENE 4 0 PS 2 18 (SUTURE) ×1 IMPLANT
TRAP FLUID SMOKE EVACUATOR (MISCELLANEOUS) ×1 IMPLANT
WATER STERILE IRR 500ML POUR (IV SOLUTION) ×1 IMPLANT

## 2022-06-05 NOTE — Progress Notes (Signed)
Patient arrived for scheduled surgery 06/05/2022. No contact information listed per patient request. Inquired of patient concerning contact and responsible party for 24 hours after surgery per policy. Pt states she has no one and no one to call. Pt gave information on 3 contacts, calls attempted, no answer. Case cancelled due to no responsible party for transportation home and 24 hour care after surgery.  Informed patient of need of a responsible contact and person to stay with her for 24 hours after any anesthesia per the hospital policy. Pt verbalizes understanding.

## 2022-06-05 NOTE — Discharge Instructions (Addendum)
Orthopedic discharge instructions: Keep dressing dry and intact. Keep hand elevated above heart level. May shower after dressing removed on postop day 4 (Monday). Cover sutures with Band-Aids after drying off, then reapply Velcro splint. Apply ice to affected area frequently. Take Lodine 400 mg BID OR ibuprofen 600-800 mg TID with meals for 3-5 days, then as necessary. Take ES Tylenol or pain medication as prescribed when needed.  Return for follow-up in 10-14 days or as scheduled.

## 2022-06-05 NOTE — H&P (Signed)
History of Present Illness: Kristie Hoover is a 72 y.o. female who presents today for her surgical history and physical for upcoming left endoscopic carpal tunnel release. Surgery scheduled with Dr. Roland Rack on 06/05/2022. The patient denies any changes to her medical history since her last evaluation. The patient denies any falls or trauma affecting left upper extremity. She does still report moderate burning and tingling in the right upper extremity at today's the patient denies any personal history of heart attack or stroke. She does have a history of asthma, she does use a Advair inhaler. She denies any history of DVT or stroke. The patient does have a history of diabetes.  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Sueo Cullen)  ENDOSCOPIC Right CARPAL TUNNEL RELEASE   Past Family History: Stroke Mother  Lupus Sister  Cardiomyopathy (Abnormal function of the heart muscle) Sister  Breast cancer Other  Diabetes Sister  Alzheimer's disease Sister   Medications: brimonidine (ALPHAGAN) 0.2 % ophthalmic solution Apply 1 drop to eye 2 (two) times daily  cyclobenzaprine (FLEXERIL) 10 MG tablet Take 1 tablet (10 mg total) by mouth 3 (three) times daily as needed  etodolac (LODINE) 400 MG tablet Take 1 tablet (400 mg total) by mouth 2 (two) times daily  fluticasone propion-salmeteroL (ADVAIR DISKUS) 100-50 mcg/dose diskus inhaler Inhale into the lungs Inhale 1 puff into the lungs See admin instructions.  Inhale 1 puff twice daily may take a third puff at bedtime as needed for shortness of breath  glipiZIDE-metFORMIN (METAGLIP) 2.5-500 mg tablet Take 1 tablet by mouth daily before breakfast.  ILEVRO 0.3 % DrpS  montelukast (SINGULAIR) 10 mg tablet Take 10 mg by mouth every morning.  moxifloxacin (VIGAMOX) 0.5 % ophthalmic solution  pantoprazole (PROTONIX) 40 MG DR tablet Take 40 mg by mouth every morning  potassium chloride (K-DUR,KLOR-CON) 20 MEQ ER tablet Take 20 mEq by mouth 2 (two) times daily.  pravastatin (PRAVACHOL) 40 MG tablet Take 40 mg by mouth every morning.  RHOPRESSA 0.02 % eye drops  timolol hemihydrate (BETIMOL) 0.5 % ophthalmic solution Apply 1 drop to eye 2 (two) times daily  aspirin 81 MG EC tablet Take 81 mg by mouth once daily (Patient not taking: Reported on 12/11/2021)  biotin 10 mg Tab Take by mouth ug Take 20,000 mcg by mouth daily. (Patient not taking: Reported on 05/31/2022)  FUROsemide (LASIX) 40 MG tablet Take 1 tablet (40 mg total) by mouth once daily (Patient not taking: Reported on 12/11/2021)  latanoprost (XALATAN) 0.005 % ophthalmic solution Apply 1 drop to eye nightly (Patient not taking: Reported on 12/11/2021)   Allergies: Oxycodone-Acetaminophen Hallucination  Hallucinations  Prednisone Unknown and Hives  Hypertension  Chlorhexidine Gluconate Itching  Chg soap  Tramadol Hallucination  Erythromycin Rash   Review of Systems:  A comprehensive 14 point ROS was performed, reviewed by me today, and the pertinent orthopaedic findings are documented in the HPI.  Physical Exam: BP 114/78  Ht 167.6 cm (5\' 6" )  Wt 71.5 kg (157 lb 9.6 oz)  BMI 25.44 kg/m  General/Constitutional: The patient appears to be well-nourished, well-developed, and in  no acute distress. Neuro/Psych: Normal mood and affect, oriented to person, place and time. Eyes: Non-icteric. Pupils are equal, round, and reactive to light, and exhibit synchronous movement. ENT:  Unremarkable. Lymphatic: No palpable adenopathy. Respiratory: Lungs clear to auscultation, Normal chest excursion, No wheezes, and Non-labored breathing Cardiovascular: Regular rate and rhythm. No murmurs. and No edema, swelling or tenderness, except as noted in detailed exam. Integumentary: No impressive skin lesions present, except as noted in detailed exam. Musculoskeletal: Unremarkable, except as noted in detailed exam.  Left wrist/hand exam: Skin inspection of the left wrist and hand remains unremarkable. No swelling, erythema, ecchymosis, abrasions, or other skin abnormalities are identified. She exhibits full active and passive range of motion of the wrist without any pain or catching. She is able to actively flex and extend all digits fully without any pain or triggering. She also can oppose her thumb to the base of the little finger. She demonstrates a negative Phalen's test, but again has an equivocally positive Tinel's over the carpal tunnel. She is neurovascularly intact to the left upper extremity and hand.  Imaging: None.  Impression: Carpal tunnel syndrome, left.  Plan:  1. Treatment options were discussed today with the patient. 2. The patient is scheduled for a left endoscopic carpal tunnel release procedure to be performed by Dr. Roland Rack in the future. This will be performed on 06/05/2022. 3. The risk and benefits were discussed in detail with the patient at today's appointment. 4. The patient would like to proceed with surgery. This document will serve as a surgical history and physical for the patient. 5. The patient will follow-up per standard postop protocol. They can call the clinic they have any questions, new symptoms develop or symptoms worsen.  The procedure was discussed with the patient, as were the potential risks (including bleeding, infection, nerve and/or blood vessel injury, persistent or recurrent pain, failure of the repair, progression of arthritis, need for  further surgery, blood clots, strokes, heart attacks and/or arhythmias, pneumonia, etc.) and benefits. The patient states her understanding and wishes to proceed.    H&P reviewed and patient re-examined. No changes.

## 2022-10-31 ENCOUNTER — Encounter: Payer: Self-pay | Admitting: *Deleted

## 2022-10-31 ENCOUNTER — Other Ambulatory Visit: Payer: Self-pay

## 2022-10-31 ENCOUNTER — Emergency Department: Payer: 59

## 2022-10-31 ENCOUNTER — Emergency Department
Admission: EM | Admit: 2022-10-31 | Discharge: 2022-11-01 | Disposition: A | Discharge status: Home / Self Care | Payer: 59 | Attending: Emergency Medicine | Admitting: Emergency Medicine

## 2022-10-31 DIAGNOSIS — R440 Auditory hallucinations: Secondary | ICD-10-CM | POA: Insufficient documentation

## 2022-10-31 DIAGNOSIS — E119 Type 2 diabetes mellitus without complications: Secondary | ICD-10-CM | POA: Diagnosis not present

## 2022-10-31 DIAGNOSIS — F05 Delirium due to known physiological condition: Secondary | ICD-10-CM

## 2022-10-31 DIAGNOSIS — R410 Disorientation, unspecified: Secondary | ICD-10-CM | POA: Insufficient documentation

## 2022-10-31 DIAGNOSIS — J45909 Unspecified asthma, uncomplicated: Secondary | ICD-10-CM | POA: Diagnosis not present

## 2022-10-31 DIAGNOSIS — I10 Essential (primary) hypertension: Secondary | ICD-10-CM | POA: Insufficient documentation

## 2022-10-31 LAB — URINALYSIS, ROUTINE W REFLEX MICROSCOPIC
Glucose, UA: NEGATIVE mg/dL
Hgb urine dipstick: NEGATIVE
Ketones, ur: 20 mg/dL — AB
Nitrite: NEGATIVE
Protein, ur: NEGATIVE mg/dL
Specific Gravity, Urine: 1.019 (ref 1.005–1.030)
pH: 5 (ref 5.0–8.0)

## 2022-10-31 LAB — URINE DRUG SCREEN, QUALITATIVE (ARMC ONLY)
Amphetamines, Ur Screen: NOT DETECTED
Barbiturates, Ur Screen: NOT DETECTED
Benzodiazepine, Ur Scrn: NOT DETECTED
Cannabinoid 50 Ng, Ur ~~LOC~~: NOT DETECTED
Cocaine Metabolite,Ur ~~LOC~~: NOT DETECTED
MDMA (Ecstasy)Ur Screen: NOT DETECTED
Methadone Scn, Ur: NOT DETECTED
Opiate, Ur Screen: NOT DETECTED
Phencyclidine (PCP) Ur S: NOT DETECTED
Tricyclic, Ur Screen: POSITIVE — AB

## 2022-10-31 LAB — COMPREHENSIVE METABOLIC PANEL
ALT: 15 U/L (ref 0–44)
AST: 18 U/L (ref 15–41)
Albumin: 3.2 g/dL — ABNORMAL LOW (ref 3.5–5.0)
Alkaline Phosphatase: 99 U/L (ref 38–126)
Anion gap: 10 (ref 5–15)
BUN: 15 mg/dL (ref 8–23)
CO2: 25 mmol/L (ref 22–32)
Calcium: 9.1 mg/dL (ref 8.9–10.3)
Chloride: 106 mmol/L (ref 98–111)
Creatinine, Ser: 0.89 mg/dL (ref 0.44–1.00)
GFR, Estimated: >60 mL/min (ref >60)
Glucose, Bld: 128 mg/dL — ABNORMAL HIGH (ref 70–99)
Potassium: 3.4 mmol/L — ABNORMAL LOW (ref 3.5–5.1)
Sodium: 141 mmol/L (ref 135–145)
Total Bilirubin: 0.8 mg/dL (ref 0.3–1.2)
Total Protein: 7.9 g/dL (ref 6.5–8.1)

## 2022-10-31 LAB — CBC WITH DIFFERENTIAL/PLATELET
Abs Immature Granulocytes: 0.08 10*3/uL — ABNORMAL HIGH (ref 0.00–0.07)
Basophils Absolute: 0 10*3/uL (ref 0.0–0.1)
Basophils Relative: 0 %
Eosinophils Absolute: 0.1 10*3/uL (ref 0.0–0.5)
Eosinophils Relative: 1 %
HCT: 37 % (ref 36.0–46.0)
Hemoglobin: 11.8 g/dL — ABNORMAL LOW (ref 12.0–15.0)
Immature Granulocytes: 1 %
Lymphocytes Relative: 7 %
Lymphs Abs: 0.7 10*3/uL (ref 0.7–4.0)
MCH: 29.1 pg (ref 26.0–34.0)
MCHC: 31.9 g/dL (ref 30.0–36.0)
MCV: 91.1 fL (ref 80.0–100.0)
Monocytes Absolute: 0.9 10*3/uL (ref 0.1–1.0)
Monocytes Relative: 10 %
Neutro Abs: 7.5 10*3/uL (ref 1.7–7.7)
Neutrophils Relative %: 81 %
Platelets: 251 10*3/uL (ref 150–400)
RBC: 4.06 MIL/uL (ref 3.87–5.11)
RDW: 15.8 % — ABNORMAL HIGH (ref 11.5–15.5)
WBC: 9.4 10*3/uL (ref 4.0–10.5)
nRBC: 0 % (ref 0.0–0.2)

## 2022-10-31 LAB — ETHANOL: Alcohol, Ethyl (B): <10 mg/dL (ref <10)

## 2022-10-31 NOTE — ED Provider Notes (Signed)
[facility] Provider Note    Event Date/Time   First MD Initiated Contact with Patient 10/31/22 1153     (approximate)   History   No chief complaint on file.   HPI  Kristie Hoover is a 72 y.o. female presenting to the ER for evaluation of confusion.  Patient tells me she is unsure why she is in the emergency department.  Triage documentation reports that patient was brought in by [facility] after she drove to the station.  There, they were concerned for confusion, paranoia, hearing voices, poor memory and she was sent to the ER.  She is not under IVC.  Patient tells me that she lives alone.  She is not sure why she drove to the police station or why she is in the ER.  She denies issues with memory.  Denies hallucinations to me.    Physical Exam   Triage Vital Signs: ED Triage Vitals  Encounter Vitals Group     BP 10/31/22 1049 (!) 176/84     Systolic BP Percentile --      Diastolic BP Percentile --      Pulse Rate 10/31/22 1049 93     Resp 10/31/22 1049 16     Temp 10/31/22 1049 98 F (36.7 C)     Temp Source 10/31/22 1049 Oral     SpO2 10/31/22 1049 99 %     Weight 10/31/22 1049 142 lb (64.4 kg)     Height 10/31/22 1049 5\' 7"  (1.702 m)     Head Circumference --      Peak Flow --      Pain Score 10/31/22 1115 0     Pain Loc --      Pain Education --      Exclude from Growth Chart --     Most recent vital signs: Vitals:   10/31/22 1049  BP: (!) 176/84  Pulse: 93  Resp: 16  Temp: 98 F (36.7 C)  SpO2: 99%     General: Awake, interactive  CV:  Regular rate, good peripheral perfusion.  Resp:  Lungs clear, unlabored respirations.  Abd:  Soft, nondistended.  Neuro:  Symmetric facial movement, fluid speech, 5 out of 5 strength in the bilateral upper and lower extremities with normal sensation, normal coordination, oriented to person, month, year, age, location, knows the president  ED Results / Procedures / Treatments    Labs (all labs ordered are listed, but only abnormal results are displayed) Labs Reviewed  COMPREHENSIVE METABOLIC PANEL - Abnormal; Notable for the following components:      Result Value   Potassium 3.4 (*)    Glucose, Bld 128 (*)    Albumin 3.2 (*)    All other components within normal limits  URINE DRUG SCREEN, QUALITATIVE ([facility] ONLY) - Abnormal; Notable for the following components:   Tricyclic, Ur Screen POSITIVE (*)    All other components within normal limits  CBC WITH DIFFERENTIAL/PLATELET - Abnormal; Notable for the following components:   Hemoglobin 11.8 (*)    RDW 15.8 (*)    Abs Immature Granulocytes 0.08 (*)    All other components within normal limits  URINALYSIS, ROUTINE W REFLEX MICROSCOPIC - Abnormal; Notable for the following components:   Color, Urine YELLOW (*)    APPearance CLEAR (*)    Bilirubin Urine MODERATE (*)    Ketones, ur 20 (*)    Leukocytes,Ua TRACE (*)    Bacteria, UA RARE (*)  All other components within normal limits  ETHANOL     EKG EKG independently reviewed interpreted by myself (ER attending) demonstrates:  EKG demonstrates sinus rhythm at a rate of 93, PR 124, QRS 68, QTc 460, no acute ST changes  RADIOLOGY Imaging independently reviewed and interpreted by myself demonstrates:  CT head without acute bleed  PROCEDURES:  Critical Care performed: No  Procedures   MEDICATIONS ORDERED IN ED: Medications - No data to display   IMPRESSION / MDM / ASSESSMENT AND PLAN / ED COURSE  I reviewed the triage vital signs and the nursing notes.  Differential diagnosis includes, but is not limited to, intracranial bleed, consideration for CVA though no last known well or focal deficits on exam, new onset dementia, anemia, electrolyte abnormality, infection including UTI, pneumonia  Patient's presentation is most consistent with acute presentation with potential threat to life or bodily function.  72 year old female presenting with  reported confusion, calm and cooperative on presentation here.  Labs without severe derangement.  Urine overall not consistent with infection.  Head CT without acute bleed.  Answering orientation questions appropriately.  Psychiatry and TTS consulted.  Case was discussed with NP Willeen Cass.  Patient with mild to moderate cognitive impairment on her screening exams.  No inpatient psychiatric indication, but she will work with social work team to identify safe disposition.  The patient has been placed in psychiatric observation due to the need to provide a safe environment for the patient while obtaining psychiatric consultation and evaluation, as well as ongoing medical and medication management to treat the patient's condition.  The patient has not been placed under full IVC at this time.       FINAL CLINICAL IMPRESSION(S) / ED DIAGNOSES   Final diagnoses:  Confusion     Rx / DC Orders   ED Discharge Orders     None        Note:  This document was prepared using Dragon voice recognition software and may include unintentional dictation errors.   [name], MD 10/31/22 1520

## 2022-10-31 NOTE — ED Notes (Signed)
[employer] called back and told me that pt has been having confusion and thinks that her neighbors are coming in and wearing her underwear, [employer] investigated and felt that pt was confused. She did confirm that pt did drive herself to police department as pt reported

## 2022-10-31 NOTE — Consult Note (Cosign Needed)
[facility] Face-to-Face Psychiatry Consult   Reason for Consult:  "Possible paranoia/hallucinations"  Referring Physician:  [name], MD Patient Identification: Kristie Hoover MRN:  [id number] Principal Diagnosis: Acute confusional state Diagnosis:  Principal Problem:   Acute confusional state Active Problems:   Auditory hallucinations   Total Time spent with patient: 45 minutes   HPI per ED MD on admission:   Kristie Hoover is a 72 y.o. female presenting to the ER for evaluation of confusion.  Patient tells me she is unsure why she is in the emergency department.  Triage documentation reports that patient was brought in by [facility] after she drove to the station.  There, they were concerned for confusion, paranoia, hearing voices, poor memory and she was sent to the ER.  She is not under IVC.   Patient tells me that she lives alone.  She is not sure why she drove to the police station or why she is in the ER.  She denies issues with memory.  Denies hallucinations to me.  ---------------------------------------------------------------------------  Patient seen face to face by this provider, consulted with attending psychiatrist M. Marval Regal and emergency department physician Cherlyn Cushing; and chart reviewed on 10/31/22. On evaluation, patient observed lying in bed, alert, and appears to be watching television. She reports being hungry. When asked why she is in the hospital, she says she doesn't know. She reports an isolated incident of hearing voices. She reports that on Saturday, she heard her barber's voice on the television but has not experienced any auditory hallucinations since then. Patient denies visual hallucinations, suicidal or homicidal ideation, past suicidal attempts, self-injurious behaviors, feelings of worthlessness, hopelessness, irritability, or guilt. She states that her sleep is good and her appetite is adequate.  Patient reports her car is at the police department,  and it was confirmed she drove there. She denies any history of mental health diagnoses or taking medication for mental health issues. She also denies a history of trauma, abuse, inpatient psychiatric treatment, or outpatient psychiatry services. She identifies her support system as her niece and nephew and reports living alone in [place]. She reports that she has no children, is divorced, and her ex-husband is deceased.  Patient denies access to firearms, a history of tobacco abuse, alcohol abuse, or illicit substance abuse. She lives alone in [place], is retired, and used to work at [employer] in [place]. She denies access to firearms, tobacco use, alcohol, or illicit substance abuse. She reports that she enjoys dancing.  During the evaluation, patient engaged easily in conversation. Speech is normal rate and coherent. Eye contact is good. Thought process is coherent and thought content is within defined limits. Patient is able to converse goal directed thoughts, no distractibility, or pre-occupation.  A Mini-Mental Status Exam was performed, scoring 24 out of 30 suggests mild dementia and the BIMS, scoring 10 out of 30, indicating moderate cognitive impairment.    Recommendation: Case reviewed with the attending psychiatrist, M. Marval Regal and it is  determined the patient will remain under observation in the ED overnight and be reassessed in the morning. Attending emergency department physician, Dorris Carnes Ray informed of plan.    Past Psychiatric History: Unremarkable  Risk to Self:  No Risk to Others:  No  Prior Inpatient Therapy:  Denies  Prior Outpatient Therapy:  Denies   Past Medical History:  Past Medical History:  Diagnosis Date   Anemia    Arthritis    Asthma    WELL CONTROLLED  DDD (degenerative disc disease), lumbar    Diabetes mellitus without complication (HCC)    GERD (gastroesophageal reflux disease)    History of hiatal hernia    Hypercholesterolemia     Hypertension    NO MEDS   Kidney infection    Pneumonia    @ 15 months old   Wears dentures    full upper, partial lower    Past Surgical History:  Procedure Laterality Date   ABDOMINAL HYSTERECTOMY     APPENDECTOMY     CARPAL TUNNEL RELEASE Right    CATARACT EXTRACTION W/PHACO Right 08/15/2021   Procedure: CATARACT EXTRACTION PHACO AND INTRAOCULAR LENS PLACEMENT (IOC) RIGHT DIABETIC;  Surgeon: Galen Manila, MD;  Location: [facility];  Service: Ophthalmology;  Laterality: Right;  6.40 0:49.8   CATARACT EXTRACTION W/PHACO Left 08/29/2021   Procedure: CATARACT EXTRACTION PHACO AND INTRAOCULAR LENS PLACEMENT (IOC) LEFT DIABETIC 8.21 00:52.9;  Surgeon: Galen Manila, MD;  Location: [facility];  Service: Ophthalmology;  Laterality: Left;  Diabetic   COLONOSCOPY     COLONOSCOPY WITH PROPOFOL N/A 08/19/2017   Procedure: COLONOSCOPY WITH PROPOFOL;  Surgeon: Scot Jun, MD;  Location: [facility];  Service: Endoscopy;  Laterality: N/A;   CYSTOCELE REPAIR N/A 03/31/2020   Procedure: ANTERIOR REPAIR (CYSTOCELE);  Surgeon: Nadara Mustard, MD;  Location: [facility] ORS;  Service: Gynecology;  Laterality: N/A;   ESOPHAGOGASTRODUODENOSCOPY     ESOPHAGOGASTRODUODENOSCOPY (EGD) WITH PROPOFOL N/A 08/19/2017   Procedure: ESOPHAGOGASTRODUODENOSCOPY (EGD) WITH PROPOFOL;  Surgeon: Scot Jun, MD;  Location: [facility] ENDOSCOPY;  Service: Endoscopy;  Laterality: N/A;   LAMINECTOMY  1998   NO METAL   RECTOCELE REPAIR N/A 03/31/2020   Procedure: POSTERIOR REPAIR (RECTOCELE);  Surgeon: Nadara Mustard, MD;  Location: [facility] OR[facility];  Service: Gynecology;  Laterality: N/A;   REVERSE SHOULDER ARTHROPLASTY Left 08/11/2020   Procedure: REVERSE SHOULDER ARTHROPLASTY;  Surgeon: Christena Flake, MD;  Location: [facility] OR[facility];  Service: Orthopedics;  Laterality: Left;   WRIST FUSION Right 1993   METAL PLATE AND SCREWS HAVE BEEN REMOVED   Family History:  Family History  Problem Relation Age of Onset    Breast cancer Other    Family Psychiatric History: Patient denies. However, upon chart review patient patient's sister with Alzheimer's disease. Social History:  Social History   Substance and Sexual Activity  Alcohol Use Never     Social History   Substance and Sexual Activity  Drug Use Never    Social History   Socioeconomic History   Marital status: Single    Spouse name: Not on file   Number of children: Not on file   Years of education: Not on file   Highest education level: Not on file  Occupational History   Occupation: warehouse (socks) industry  Tobacco Use   Smoking status: Never   Smokeless tobacco: Never  Vaping Use   Vaping status: Never Used  Substance and Sexual Activity   Alcohol use: Never   Drug use: Never   Sexual activity: Not Currently  Other Topics Concern   Not on file  Social History Narrative   Patient lives alone in an apartment building.   Feels safe in her home.    Social Determinants of Health   Financial Resource Strain: Not on file  Food Insecurity: Not on file  Transportation Needs: Not on file  Physical Activity: Not on file  Stress: Not on file  Social Connections: Not on file   Additional Social History:  Allergies:   Allergies  Allergen Reactions   Oxycodone-Acetaminophen     Hallucinations    Prednisone Hives   Tramadol Other (See Comments)    Hallucinations   Chlorhexidine Gluconate Itching    Chg soap   Erythromycin Rash    Labs:  Results for orders placed or performed during the hospital encounter of 10/31/22 (from the past 48 hour(s))  Comprehensive metabolic panel     Status: Abnormal   Collection Time: 10/31/22 10:50 AM  Result Value Ref Range   Sodium 141 135 - 145 mmol/L   Potassium 3.4 (L) 3.5 - 5.1 mmol/L   Chloride 106 98 - 111 mmol/L   CO2 25 22 - 32 mmol/L   Glucose, Bld 128 (H) 70 - 99 mg/dL    Comment: Glucose reference range applies only to samples taken after fasting for at least 8  hours.   BUN 15 8 - 23 mg/dL   Creatinine, Ser 6.96 0.44 - 1.00 mg/dL   Calcium 9.1 8.9 - 29.5 mg/dL   Total Protein 7.9 6.5 - 8.1 g/dL   Albumin 3.2 (L) 3.5 - 5.0 g/dL   AST 18 15 - 41 U/L   ALT 15 0 - 44 U/L   Alkaline Phosphatase 99 38 - 126 U/L   Total Bilirubin 0.8 0.3 - 1.2 mg/dL   GFR, Estimated >28 >41 mL/min    Comment: (NOTE) Calculated using the CKD-EPI Creatinine Equation (2021)    Anion gap 10 5 - 15    Comment: Performed at [facility], [address] [place], [place] [address]  Ethanol     Status: None   Collection Time: 10/31/22 10:50 AM  Result Value Ref Range   Alcohol, Ethyl (B) <10 <10 mg/dL    Comment: (NOTE) Lowest detectable limit for serum alcohol is 10 mg/dL.  For medical purposes only. Performed at [facility], [address] [place], [place] [address]   CBC with Diff     Status: Abnormal   Collection Time: 10/31/22 10:50 AM  Result Value Ref Range   WBC 9.4 4.0 - 10.5 K/uL   RBC 4.06 3.87 - 5.11 MIL/uL   Hemoglobin 11.8 (L) 12.0 - 15.0 g/dL   HCT 53.6 64.4 - 03.4 %   MCV 91.1 80.0 - 100.0 fL   MCH 29.1 26.0 - 34.0 pg   MCHC 31.9 30.0 - 36.0 g/dL   RDW 74.2 (H) 59.5 - 63.8 %   Platelets 251 150 - 400 K/uL   nRBC 0.0 0.0 - 0.2 %   Neutrophils Relative % 81 %   Neutro Abs 7.5 1.7 - 7.7 K/uL   Lymphocytes Relative 7 %   Lymphs Abs 0.7 0.7 - 4.0 K/uL   Monocytes Relative 10 %   Monocytes Absolute 0.9 0.1 - 1.0 K/uL   Eosinophils Relative 1 %   Eosinophils Absolute 0.1 0.0 - 0.5 K/uL   Basophils Relative 0 %   Basophils Absolute 0.0 0.0 - 0.1 K/uL   Immature Granulocytes 1 %   Abs Immature Granulocytes 0.08 (H) 0.00 - 0.07 K/uL    Comment: Performed at [facility], [address] [place], [place] [address]  Urine Drug Screen, Qualitative     Status: Abnormal   Collection Time: 10/31/22 11:30 AM  Result Value Ref Range   Tricyclic, Ur Screen POSITIVE (A) NONE DETECTED   Amphetamines, Ur Screen NONE  DETECTED NONE DETECTED   MDMA (Ecstasy)Ur Screen NONE DETECTED NONE DETECTED   Cocaine Metabolite,Ur  [place] NONE DETECTED NONE DETECTED   Opiate, Ur Screen NONE DETECTED NONE DETECTED   Phencyclidine (PCP) Ur S NONE DETECTED NONE DETECTED   Cannabinoid 50 Ng, Ur [place] NONE DETECTED NONE DETECTED   Barbiturates, Ur Screen NONE DETECTED NONE DETECTED   Benzodiazepine, Ur Scrn NONE DETECTED NONE DETECTED   Methadone Scn, Ur NONE DETECTED NONE DETECTED    Comment: (NOTE) Tricyclics + metabolites, urine    Cutoff 1000 ng/mL Amphetamines + metabolites, urine  Cutoff 1000 ng/mL MDMA (Ecstasy), urine              Cutoff 500 ng/mL Cocaine Metabolite, urine          Cutoff 300 ng/mL Opiate + metabolites, urine        Cutoff 300 ng/mL Phencyclidine (PCP), urine         Cutoff 25 ng/mL Cannabinoid, urine                 Cutoff 50 ng/mL Barbiturates + metabolites, urine  Cutoff 200 ng/mL Benzodiazepine, urine              Cutoff 200 ng/mL Methadone, urine                   Cutoff 300 ng/mL  The urine drug screen provides only a preliminary, unconfirmed analytical test result and should not be used for non-medical purposes. Clinical consideration and professional judgment should be applied to any positive drug screen result due to possible interfering substances. A more specific alternate chemical method must be used in order to obtain a confirmed analytical result. Gas chromatography / mass spectrometry (GC/MS) is the preferred confirm atory method. Performed at [facility], [address] [place], [place] [address]   Urinalysis, Routine w reflex microscopic -Urine, Clean Catch     Status: Abnormal   Collection Time: 10/31/22 11:30 AM  Result Value Ref Range   Color, Urine YELLOW (A) YELLOW   APPearance CLEAR (A) CLEAR   Specific Gravity, Urine 1.019 1.005 - 1.030   pH 5.0 5.0 - 8.0   Glucose, UA NEGATIVE NEGATIVE mg/dL   Hgb urine dipstick NEGATIVE NEGATIVE   Bilirubin Urine  MODERATE (A) NEGATIVE   Ketones, ur 20 (A) NEGATIVE mg/dL   Protein, ur NEGATIVE NEGATIVE mg/dL   Nitrite NEGATIVE NEGATIVE   Leukocytes,Ua TRACE (A) NEGATIVE   RBC / HPF 0-5 0 - 5 RBC/hpf   WBC, UA 0-5 0 - 5 WBC/hpf   Bacteria, UA RARE (A) NONE SEEN   Squamous Epithelial / HPF 0-5 0 - 5 /HPF   Mucus PRESENT     Comment: Performed at [facility], [address] [place], [place] [address]    No current facility-administered medications for this encounter.   Current Outpatient Medications  Medication Sig Dispense Refill   glipiZIDE-metformin (METAGLIP) 2.5-500 MG tablet Take 1 tablet by mouth daily before breakfast.     acetaminophen (TYLENOL) 500 MG tablet Take 1,000 mg by mouth every 6 (six) hours as needed for moderate pain or headache. (Patient not taking: Reported on 06/05/2022)     Biotin 1000 MCG CHEW Chew 2,000 mcg by mouth daily.     cyclobenzaprine (FLEXERIL) 10 MG tablet Take 10 mg by mouth every morning. AND PRN     etodolac (LODINE) 400 MG tablet Take 400 mg by mouth 2 (two) times daily.     Fluticasone-Salmeterol (ADVAIR) 100-50 MCG/DOSE AEPB Inhale 1 puff into the lungs in the morning and at bedtime. Inhale  1 puff twice daily may take a third puff at bedtime as needed for shortness of breath     latanoprost (XALATAN) 0.005 % ophthalmic solution Place 1 drop into both eyes at bedtime.     montelukast (SINGULAIR) 10 MG tablet Take 10 mg by mouth every morning.     Multiple Vitamin (MULTIVITAMIN WITH MINERALS) TABS tablet Take 1 tablet by mouth daily.     pantoprazole (PROTONIX) 40 MG tablet Take 40 mg by mouth every morning.     potassium chloride SA (K-DUR,KLOR-CON) 20 MEQ tablet Take 20 mEq by mouth 2 (two) times daily.     pravastatin (PRAVACHOL) 40 MG tablet Take 40 mg by mouth every morning.      Musculoskeletal: Strength & Muscle Tone: within normal limits Gait & Station: normal Patient leans: N/A            Psychiatric Specialty  Exam:  Presentation  General Appearance: Appropriate for Environment  Eye Contact:Good  Speech:Clear and Coherent  Speech Volume:Normal  Handedness:Right   Mood and Affect  Mood:-- (Appropriate)  Affect:Appropriate   Thought Process  Thought Processes:Coherent  Descriptions of Associations:Intact  Orientation:Partial  Thought Content:Logical  History of Schizophrenia/Schizoaffective disorder:No data recorded Duration of Psychotic Symptoms:No data recorded Hallucinations:Hallucinations: Other (comment) (Paient denies active auditory hallucinations)  Ideas of Reference:None  Suicidal Thoughts:Suicidal Thoughts: No  Homicidal Thoughts:Homicidal Thoughts: No   Sensorium  Memory:Immediate Fair; Recent Fair  Judgment:Fair  Insight:Fair   Executive Functions  Concentration:Fair  Attention Span:Fair  Recall:Fair  Fund of Knowledge:Fair  Language:Fair   Psychomotor Activity  Psychomotor Activity:Psychomotor Activity: Normal   Assets  Assets:Financial Resources/Insurance; Housing; Physical Health; Resilience   Sleep  Sleep:Sleep: Good   Physical Exam: Physical Exam Vitals and nursing note reviewed.  Constitutional:      General: She is not in acute distress. HENT:     Head: Normocephalic.     Nose: Nose normal.  Cardiovascular:     Pulses: Normal pulses.     Comments: Blood Pressure 176/84 Pulmonary:     Effort: Pulmonary effort is normal. No respiratory distress.  Musculoskeletal:        General: Normal range of motion.     Cervical back: Normal range of motion.  Neurological:     Mental Status: She is alert and oriented to person, place, and time.  Psychiatric:        Attention and Perception: Attention normal. She does not perceive auditory or visual hallucinations.        Mood and Affect: Mood and affect normal.        Speech: Speech normal.        Behavior: Behavior is cooperative.        Thought Content: Thought content is not  paranoid or delusional. Thought content does not include homicidal or suicidal ideation.        Cognition and Memory: Cognition normal. Memory is impaired.    Review of Systems  Respiratory:  Negative for shortness of breath.   Psychiatric/Behavioral: Negative.    All other systems reviewed and are negative.  Blood pressure (!) 176/84, pulse 93, temperature 98 F (36.7 C), temperature source Oral, resp. rate 16, height 5\' 7"  (1.702 m), weight 64.4 kg, SpO2 99%. Body mass index is 22.24 kg/m.  Treatment Plan Summary: 72 year old female patient presents with an isolated incident of hearing voices. She reports that on Saturday, she heard her barber's voice on the television but has not experienced any auditory hallucinations since then.  She  will remain under observation in the Emergency Department overnight and will be reassessed in the morning.    Disposition: No evidence of imminent risk to self or others at present.    Norma Fredrickson, NP 10/31/2022 4:08 PM

## 2022-10-31 NOTE — ED Notes (Signed)
Pt given snack and drink 

## 2022-10-31 NOTE — BH Assessment (Signed)
Comprehensive Clinical Assessment (CCA) Screening, Triage and Referral Note  10/31/2022 FLOYE SCHNAIDT [id number]  Patton Salles, 72 year old female who presents to [facility] ED voluntarily for treatment. Per triage note, Charge RN was told by [employer] that pt is being brought in for new confusion, paranoia, hearing voices, poor memory recall. All new onset according to PD Crisis who spoke with patients PCP. When I asked pt about this she states that she does not have any issues with her memory but that she is concerned about her neighbors in [address] (she lives in [address]) and she heard her barbers voice Denyse Amass) last Saturday and it sounded like it was coming from [address].   During TTS assessment pt presents alert and oriented x 4, restless but cooperative, and mood-congruent with affect. The pt does not appear to be responding to internal or external stimuli. Neither is the pt presenting with any delusional thinking. Pt verified the information provided to triage RN.   Pt identifies her main complaint to be that she is not sure why she was brought to the ED. Patient reports she did indeed driver herself to the police department but did not mention why. Patient states after returning home last Saturday from church, she thought she heard her barber's voice coming from the TV. Her barber, Denyse Amass is also her next-door neighbor. Patient states at that time she asked Lyndell if he had been in her apartment. Lyndell replied no. Patient reports she has not heard his voice in the TV since. Pateint states she lives alone but has a niece and nephew that live nearby to check up on her when needed. Patient reports she sleeps "like a baby" and her appetite is "good". Patient denies using any illicit substances and alcohol. Pt reports having no INPT or OPT hx. Pt denies current SI/HI/AH/VH. Patient is calm, cooperative, coherent, and speaking in linear sentences. Pt contracts for safety. Pt provided her niece,  Oletta Lamas and nephew, Laurier Nancy as collateral contacts; however, their phone numbers are stored in her home phone. Patient states she does not have a cell phone.    Per Christal, NP, pt is recommended for overnight observation to be reassessed in the morning.   Chief Complaint: No chief complaint on file.  Visit Diagnosis: Diagnosis deferred  Patient Reported Information How did you hear about [place]? -- Mudlogger)  What Is the Reason for Your Visit/Call Today? Patient reports she is not sure why she brought to the ED.  How Long Has This Been Causing You Problems? 1 wk - 1 month  What Do You Feel Would Help You the Most Today? -- (Assessment only)   Have You Recently Had Any Thoughts About Hurting Yourself? No  Are You Planning to Commit Suicide/Harm Yourself At This time? No   Have you Recently Had Thoughts About Hurting Someone Karolee Ohs? No  Are You Planning to Harm Someone at This Time? No  Explanation: No data recorded  Have You Used Any Alcohol or Drugs in the Past 24 Hours? No  How Long Ago Did You Use Drugs or Alcohol? No data recorded What Did You Use and How Much? No data recorded  Do You Currently Have a Therapist/Psychiatrist? No  Name of Therapist/Psychiatrist: No data recorded  Have You Been Recently Discharged From Any Office Practice or Programs? No  Explanation of Discharge From Practice/Program: No data recorded   CCA Screening Triage Referral Assessment Type of Contact: Face-to-Face  Telemedicine Service Delivery:  Is this Initial or Reassessment?   Date Telepsych consult ordered in [facility]:    Time Telepsych consult ordered in [facility]:    Location of Assessment: [facility] ED  Provider Location: [facility] ED    Collateral Involvement: None provided   Does Patient Have a Court Appointed Legal Guardian? No data recorded Name and Contact of Legal Guardian: No data recorded If Minor and Not Living with Parent(s), Who has Custody? No data recorded Is  CPS involved or ever been involved? Never  Is APS involved or ever been involved? Never   Patient Determined To Be At Risk for Harm To Self or Others Based on Review of Patient Reported Information or Presenting Complaint? No  Method: No Plan  Availability of Means: No access or NA  Intent: Vague intent or NA  Notification Required: No need or identified person  Additional Information for Danger to Others Potential: No data recorded Additional Comments for Danger to Others Potential: No data recorded Are There Guns or Other Weapons in Your Home? No  Types of Guns/Weapons: No data recorded Are These Weapons Safely Secured?                            No data recorded Who Could Verify You Are Able To Have These Secured: No data recorded Do You Have any Outstanding Charges, Pending Court Dates, Parole/Probation? No data recorded Contacted To Inform of Risk of Harm To Self or Others: No data recorded  Does Patient Present under Involuntary Commitment? No    [place] of Residence: [place]   Patient Currently Receiving the Following Services: Not Receiving Services   Determination of Need: Emergent (2 hours)   Options For Referral: ED Visit; Therapeutic Triage Services   Discharge Disposition:     Clerance Lav, Counselor, LCAS-A

## 2022-10-31 NOTE — ED Notes (Signed)
VOLUNTARY AWAITING PSYCH CONSULT FOR DISPO

## 2022-10-31 NOTE — ED Notes (Signed)
Patient states "they brought me here because they think I'm not right in the head" when asked what she is being seen for today. Patient makes comments throughout conversation that don't quite fit the conversation (ex: stating "my brothers taught me things from the time I started walking" when asked to get on to CT table for scan). Patient is pleasant and cooperative, able to carry conversation easily.

## 2022-10-31 NOTE — ED Triage Notes (Addendum)
Triage tech tells me that pt was brought in by law enforcement but that they have already left as "pt is voluntary".  Pt is sitting calmly in triage room, she is pleasant and tells me that she lives alone in her apartment and that she was brought in by [employer] "but they did not have any sirens on".  I asked her why [employer] brought her up here and she states that "I really don't know".  I asked her if they picked her up at home and she said that "I drove myself to the police station and then they brought her here after a couple of hours".   Pt tells me that she has been living at her apartment for 50 years and she lives by herself and she is safe there.   Pt states that police told her they were bringing her to the hospital so she could be evaluated. Pt denies any SI or HI and pt is alert and oriented.  Addend: Charge RN was told by [employer] that pt is being brought in for new confusion, paranoia, hearing voices, poor memory recall. All new onset according to PD Crisis who spoke with patients PCP. When I asked pt about this she states that she does not have any issues with her memory but that she is concerned about her neighbors in [address] (she lives in [address]) and she heard her barbers voice Denyse Amass) last Saturday and it sounded like it was coming from [address].

## 2022-10-31 NOTE — ED Notes (Addendum)
Charge RN was told by [employer] that pt is being brought in for new confusion, paranoia, hearing voices, poor memory recall. All new onset according to [employer] Crisis who spoke with patients PCP. When I asked pt about this she states that she does not have any issues with her memory but that she is concerned about her neighbors in [address] (she lives in [address]) and she heard her barbers voice Denyse Amass) last Saturday and it sounded like it was coming from [address].

## 2022-11-01 NOTE — ED Notes (Signed)
Pt getting dressed.

## 2022-11-01 NOTE — ED Notes (Signed)
Brayton Caves RN called kitchen, breakfast tray still on way.

## 2022-11-01 NOTE — ED Provider Notes (Signed)
-----------------------------------------   12:26 PM on 11/01/2022 ----------------------------------------- Patient has been seen by psychiatry.  They believe the patient safe for discharge home from a psychiatric standpoint.  Medical workup largely nonrevealing.  TTS was able to arrange pickup with family.  They will be here around 2:30 PM to pick up the patient and bring them home.   Minna Antis, MD 11/01/22 [phone]

## 2022-11-01 NOTE — ED Notes (Signed)
TTS spoke with family who will come to pick up pt shortly. EDP aware.

## 2022-11-01 NOTE — Consult Note (Signed)
[facility] Face-to-Face Psychiatry Consult   Reason for Consult:  "Possible paranoia/hallucinations"  Referring Physician:  [name], MD Patient Identification: Kristie Hoover MRN:  [id number] Principal Diagnosis: Acute confusional state Diagnosis:  Principal Problem:   Acute confusional state Active Problems:   Auditory hallucinations   Total Time spent with patient: 45 minutes   HPI per ED MD on admission:   Kristie Hoover is a 72 y.o. female presenting to the ER for evaluation of confusion.  Patient tells me she is unsure why she is in the emergency department.  Triage documentation reports that patient was brought in by [facility] after she drove to the station.  There, they were concerned for confusion, paranoia, hearing voices, poor memory and she was sent to the ER.  She is not under IVC.   Patient tells me that she lives alone.  She is not sure why she drove to the police station or why she is in the ER.  She denies issues with memory.  Denies hallucinations to me.  ---------------------------------------------------------------------------  8/14 - Patient seen face to face by this provider, consulted with attending psychiatrist M. Marval Regal and emergency department physician Cherlyn Cushing; and chart reviewed on 10/31/22. On evaluation, patient observed lying in bed, alert, and appears to be watching television. She reports being hungry. When asked why she is in the hospital, she says she doesn't know. She reports an isolated incident of hearing voices. She reports that on Saturday, she heard her barber's voice on the television but has not experienced any auditory hallucinations since then. Patient denies visual hallucinations, suicidal or homicidal ideation, past suicidal attempts, self-injurious behaviors, feelings of worthlessness, hopelessness, irritability, or guilt. She states that her sleep is good and her appetite is adequate.  Patient reports her car is at the police  department, and it was confirmed she drove there. She denies any history of mental health diagnoses or taking medication for mental health issues. She also denies a history of trauma, abuse, inpatient psychiatric treatment, or outpatient psychiatry services. She identifies her support system as her niece and nephew and reports living alone in [place]. She reports that she has no children, is divorced, and her ex-husband is deceased.  Patient denies access to firearms, a history of tobacco abuse, alcohol abuse, or illicit substance abuse. She is retired, and used to work at [employer] in [place]. She denies access to firearms, tobacco use, alcohol, or illicit substance abuse. She shares that she enjoys dancing.  During the evaluation, patient engaged easily in conversation. Speech is normal rate and coherent. Eye contact is good. Thought process is coherent and thought content is within defined limits. Patient is able to converse goal directed thoughts, no distractibility, or pre-occupation.  A Mini-Mental Status Exam was performed, scoring 24 out of 30 suggests mild dementia and the BIMS, scoring 10 out of 30, indicating moderate cognitive impairment.    Recommendation: Case reviewed with the attending psychiatrist, M. Marval Regal and it is  determined the patient will remain under observation in the ED overnight and be reassessed in the morning. Attending emergency department physician, Dorris Carnes Ray informed of plan.   ---------------------------------------------------------------------------  8/15 - Patient seen face to face by this provider, consulted with attending psychiatrist M. Marval Regal and emergency department physician K. Paduchowski; and chart reviewed on 11/01/22. On reassessment today, she reports a past experience of auditory hallucinations, recalling an instance when she heard a voice and subsequently turned off the TV. The patient states that she was  brought to the hospital by the police  for evaluation after her neighbor reported her acting erratically. Patient reports that individuals in her apartment building were selling drugs, and she had previously driven to the police department to inform them of the situation and to inquire about their communication with her landlord. She expresses feeling safe in her neighborhood, having resided there for over 50 years, and is determined not to be forced out.  Patient identifies her support system as her niece Kristie Hoover, nephew/son, [name], but does not have their contact information readily available. She states she has a landline and their numbers programmed into her phone.   During the evaluation, patient is lying on a stretcher in the hallway, dressed in scrubs, appears pleasant and cooperative. She is oriented to month, year, town, state, county, and season; incorrectly identifies the day of the week.  Memory recall is 2/3 after cueing. Speech normal in rate and volume. Patient denies suicidal and homicidal ideation; denies active auditory or visual hallucinations. No evidence of responding to internal or external stimuli.   Disposition: Currently, patient does not meet criteria for psychiatric inpatient admission. Patient is cleared psychiatrically.  ---------------------------------------------------------------------------   Past Psychiatric History: Unremarkable  Risk to Self:  No Risk to Others:  No  Prior Inpatient Therapy:  Denies  Prior Outpatient Therapy:  Denies   Past Medical History:  Past Medical History:  Diagnosis Date   Anemia    Arthritis    Asthma    WELL CONTROLLED   DDD (degenerative disc disease), lumbar    Diabetes mellitus without complication (HCC)    GERD (gastroesophageal reflux disease)    History of hiatal hernia    Hypercholesterolemia    Hypertension    NO MEDS   Kidney infection    Pneumonia    @ 16 months old   Wears dentures    full upper, partial lower    Past Surgical History:   Procedure Laterality Date   ABDOMINAL HYSTERECTOMY     APPENDECTOMY     CARPAL TUNNEL RELEASE Right    CATARACT EXTRACTION W/PHACO Right 08/15/2021   Procedure: CATARACT EXTRACTION PHACO AND INTRAOCULAR LENS PLACEMENT (IOC) RIGHT DIABETIC;  Surgeon: Galen Manila, MD;  Location: [facility];  Service: Ophthalmology;  Laterality: Right;  6.40 0:49.8   CATARACT EXTRACTION W/PHACO Left 08/29/2021   Procedure: CATARACT EXTRACTION PHACO AND INTRAOCULAR LENS PLACEMENT (IOC) LEFT DIABETIC 8.21 00:52.9;  Surgeon: Galen Manila, MD;  Location: [facility];  Service: Ophthalmology;  Laterality: Left;  Diabetic   COLONOSCOPY     COLONOSCOPY WITH PROPOFOL N/A 08/19/2017   Procedure: COLONOSCOPY WITH PROPOFOL;  Surgeon: Scot Jun, MD;  Location: [facility];  Service: Endoscopy;  Laterality: N/A;   CYSTOCELE REPAIR N/A 03/31/2020   Procedure: ANTERIOR REPAIR (CYSTOCELE);  Surgeon: Nadara Mustard, MD;  Location: [facility] ORS;  Service: Gynecology;  Laterality: N/A;   ESOPHAGOGASTRODUODENOSCOPY     ESOPHAGOGASTRODUODENOSCOPY (EGD) WITH PROPOFOL N/A 08/19/2017   Procedure: ESOPHAGOGASTRODUODENOSCOPY (EGD) WITH PROPOFOL;  Surgeon: Scot Jun, MD;  Location: [facility];  Service: Endoscopy;  Laterality: N/A;   LAMINECTOMY  1998   NO METAL   RECTOCELE REPAIR N/A 03/31/2020   Procedure: POSTERIOR REPAIR (RECTOCELE);  Surgeon: Nadara Mustard, MD;  Location: [facility] ORS;  Service: Gynecology;  Laterality: N/A;   REVERSE SHOULDER ARTHROPLASTY Left 08/11/2020   Procedure: REVERSE SHOULDER ARTHROPLASTY;  Surgeon: Christena Flake, MD;  Location: [facility] ORS;  Service: Orthopedics;  Laterality: Left;   WRIST  FUSION Right 1993   METAL PLATE AND SCREWS HAVE BEEN REMOVED   Family History:  Family History  Problem Relation Age of Onset   Breast cancer Other    Family Psychiatric History: Patient denies. However, upon chart review patient patient's sister with Alzheimer's  disease. Social History:  Social History   Substance and Sexual Activity  Alcohol Use Never     Social History   Substance and Sexual Activity  Drug Use Never    Social History   Socioeconomic History   Marital status: Single    Spouse name: Not on file   Number of children: Not on file   Years of education: Not on file   Highest education level: Not on file  Occupational History   Occupation: warehouse (socks) industry  Tobacco Use   Smoking status: Never   Smokeless tobacco: Never  Vaping Use   Vaping status: Never Used  Substance and Sexual Activity   Alcohol use: Never   Drug use: Never   Sexual activity: Not Currently  Other Topics Concern   Not on file  Social History Narrative   Patient lives alone in an apartment building.   Feels safe in her home.    Social Determinants of Health   Financial Resource Strain: Not on file  Food Insecurity: Not on file  Transportation Needs: Not on file  Physical Activity: Not on file  Stress: Not on file  Social Connections: Not on file   Additional Social History:    Allergies:   Allergies  Allergen Reactions   Oxycodone-Acetaminophen     Hallucinations    Prednisone Hives   Tramadol Other (See Comments)    Hallucinations   Chlorhexidine Gluconate Itching    Chg soap   Erythromycin Rash    Labs:  Results for orders placed or performed during the hospital encounter of 10/31/22 (from the past 48 hour(s))  Comprehensive metabolic panel     Status: Abnormal   Collection Time: 10/31/22 10:50 AM  Result Value Ref Range   Sodium 141 135 - 145 mmol/L   Potassium 3.4 (L) 3.5 - 5.1 mmol/L   Chloride 106 98 - 111 mmol/L   CO2 25 22 - 32 mmol/L   Glucose, Bld 128 (H) 70 - 99 mg/dL    Comment: Glucose reference range applies only to samples taken after fasting for at least 8 hours.   BUN 15 8 - 23 mg/dL   Creatinine, Ser 7.82 0.44 - 1.00 mg/dL   Calcium 9.1 8.9 - 95.6 mg/dL   Total Protein 7.9 6.5 - 8.1 g/dL    Albumin 3.2 (L) 3.5 - 5.0 g/dL   AST 18 15 - 41 U/L   ALT 15 0 - 44 U/L   Alkaline Phosphatase 99 38 - 126 U/L   Total Bilirubin 0.8 0.3 - 1.2 mg/dL   GFR, Estimated >21 >30 mL/min    Comment: (NOTE) Calculated using the CKD-EPI Creatinine Equation (2021)    Anion gap 10 5 - 15    Comment: Performed at [facility], [address] [place], [place] [address]  Ethanol     Status: None   Collection Time: 10/31/22 10:50 AM  Result Value Ref Range   Alcohol, Ethyl (B) <10 <10 mg/dL    Comment: (NOTE) Lowest detectable limit for serum alcohol is 10 mg/dL.  For medical purposes only. Performed at [facility], [address] [place], [place] [address]   CBC with Diff     Status:  Abnormal   Collection Time: 10/31/22 10:50 AM  Result Value Ref Range   WBC 9.4 4.0 - 10.5 K/uL   RBC 4.06 3.87 - 5.11 MIL/uL   Hemoglobin 11.8 (L) 12.0 - 15.0 g/dL   HCT 96.0 45.4 - 09.8 %   MCV 91.1 80.0 - 100.0 fL   MCH 29.1 26.0 - 34.0 pg   MCHC 31.9 30.0 - 36.0 g/dL   RDW 11.9 (H) 14.7 - 82.9 %   Platelets 251 150 - 400 K/uL   nRBC 0.0 0.0 - 0.2 %   Neutrophils Relative % 81 %   Neutro Abs 7.5 1.7 - 7.7 K/uL   Lymphocytes Relative 7 %   Lymphs Abs 0.7 0.7 - 4.0 K/uL   Monocytes Relative 10 %   Monocytes Absolute 0.9 0.1 - 1.0 K/uL   Eosinophils Relative 1 %   Eosinophils Absolute 0.1 0.0 - 0.5 K/uL   Basophils Relative 0 %   Basophils Absolute 0.0 0.0 - 0.1 K/uL   Immature Granulocytes 1 %   Abs Immature Granulocytes 0.08 (H) 0.00 - 0.07 K/uL    Comment: Performed at [facility], [address] [place], [place] [address]  Urine Drug Screen, Qualitative     Status: Abnormal   Collection Time: 10/31/22 11:30 AM  Result Value Ref Range   Tricyclic, Ur Screen POSITIVE (A) NONE DETECTED   Amphetamines, Ur Screen NONE DETECTED NONE DETECTED   MDMA (Ecstasy)Ur Screen NONE DETECTED NONE DETECTED   Cocaine Metabolite,Ur [place] NONE DETECTED NONE DETECTED   Opiate, Ur  Screen NONE DETECTED NONE DETECTED   Phencyclidine (PCP) Ur S NONE DETECTED NONE DETECTED   Cannabinoid 50 Ng, Ur [place] NONE DETECTED NONE DETECTED   Barbiturates, Ur Screen NONE DETECTED NONE DETECTED   Benzodiazepine, Ur Scrn NONE DETECTED NONE DETECTED   Methadone Scn, Ur NONE DETECTED NONE DETECTED    Comment: (NOTE) Tricyclics + metabolites, urine    Cutoff 1000 ng/mL Amphetamines + metabolites, urine  Cutoff 1000 ng/mL MDMA (Ecstasy), urine              Cutoff 500 ng/mL Cocaine Metabolite, urine          Cutoff 300 ng/mL Opiate + metabolites, urine        Cutoff 300 ng/mL Phencyclidine (PCP), urine         Cutoff 25 ng/mL Cannabinoid, urine                 Cutoff 50 ng/mL Barbiturates + metabolites, urine  Cutoff 200 ng/mL Benzodiazepine, urine              Cutoff 200 ng/mL Methadone, urine                   Cutoff 300 ng/mL  The urine drug screen provides only a preliminary, unconfirmed analytical test result and should not be used for non-medical purposes. Clinical consideration and professional judgment should be applied to any positive drug screen result due to possible interfering substances. A more specific alternate chemical method must be used in order to obtain a confirmed analytical result. Gas chromatography / mass spectrometry (GC/MS) is the preferred confirm atory method. Performed at [facility], [address] [place], [place] [address]   Urinalysis, Routine w reflex microscopic -Urine, Clean Catch     Status: Abnormal   Collection Time: 10/31/22 11:30 AM  Result Value Ref Range   Color, Urine YELLOW (A) YELLOW   APPearance CLEAR (A) CLEAR  Specific Gravity, Urine 1.019 1.005 - 1.030   pH 5.0 5.0 - 8.0   Glucose, UA NEGATIVE NEGATIVE mg/dL   Hgb urine dipstick NEGATIVE NEGATIVE   Bilirubin Urine MODERATE (A) NEGATIVE   Ketones, ur 20 (A) NEGATIVE mg/dL   Protein, ur NEGATIVE NEGATIVE mg/dL   Nitrite NEGATIVE NEGATIVE   Leukocytes,Ua TRACE (A)  NEGATIVE   RBC / HPF 0-5 0 - 5 RBC/hpf   WBC, UA 0-5 0 - 5 WBC/hpf   Bacteria, UA RARE (A) NONE SEEN   Squamous Epithelial / HPF 0-5 0 - 5 /HPF   Mucus PRESENT     Comment: Performed at [facility], [address] [place], [place] [address]    No current facility-administered medications for this encounter.   Current Outpatient Medications  Medication Sig Dispense Refill   aspirin EC 81 MG tablet Take 81 mg by mouth daily. Swallow whole.     Biotin 1000 MCG CHEW Chew 2,000 mcg by mouth daily.     cyclobenzaprine (FLEXERIL) 10 MG tablet Take 10 mg by mouth every morning. AND PRN     etodolac (LODINE) 400 MG tablet Take 400 mg by mouth 2 (two) times daily.     Fluticasone-Salmeterol (ADVAIR) 100-50 MCG/DOSE AEPB Inhale 1 puff into the lungs in the morning and at bedtime. Inhale 1 puff twice daily may take a third puff at bedtime as needed for shortness of breath     glipiZIDE-metformin (METAGLIP) 2.5-500 MG tablet Take 1 tablet by mouth daily before breakfast.     latanoprost (XALATAN) 0.005 % ophthalmic solution Place 1 drop into both eyes at bedtime.     montelukast (SINGULAIR) 10 MG tablet Take 10 mg by mouth every morning.     nepafenac (ILEVRO) 0.3 % ophthalmic suspension      Netarsudil Dimesylate (RHOPRESSA) 0.02 % SOLN      pantoprazole (PROTONIX) 40 MG tablet Take 40 mg by mouth every morning.     potassium chloride SA (K-DUR,KLOR-CON) 20 MEQ tablet Take 20 mEq by mouth 2 (two) times daily.     pravastatin (PRAVACHOL) 40 MG tablet Take 40 mg by mouth every morning.     acetaminophen (TYLENOL) 500 MG tablet Take 1,000 mg by mouth every 6 (six) hours as needed for moderate pain or headache. (Patient not taking: Reported on 06/05/2022)     furosemide (LASIX) 40 MG tablet Take 40 mg by mouth daily.     Multiple Vitamin (MULTIVITAMIN WITH MINERALS) TABS tablet Take 1 tablet by mouth daily.      Musculoskeletal: Strength & Muscle Tone: within normal limits Gait & Station:  normal Patient leans: N/A            Psychiatric Specialty Exam:  Presentation  General Appearance: Appropriate for Environment  Eye Contact:Good  Speech:Clear and Coherent  Speech Volume:Normal  Handedness:Right   Mood and Affect  Mood:-- (Appropriate)  Affect:Appropriate   Thought Process  Thought Processes:Coherent  Descriptions of Associations:Intact  Orientation:Partial  Thought Content:Logical  History of Schizophrenia/Schizoaffective disorder:No  Duration of Psychotic Symptoms:N/A Hallucinations:Hallucinations: Other (comment) (Paient denies active auditory hallucinations)  Ideas of Reference:None  Suicidal Thoughts:Suicidal Thoughts: No  Homicidal Thoughts:Homicidal Thoughts: No   Sensorium  Memory:Immediate Fair; Recent Fair  Judgment:Fair  Insight:Fair   Executive Functions  Concentration:Fair  Attention Span:Fair  Recall:Fair  Fund of Knowledge:Fair  Language:Fair   Psychomotor Activity  Psychomotor Activity:Psychomotor Activity: Normal   Assets  Assets:Financial Resources/Insurance; Housing; Physical Health; Resilience   Sleep  Sleep:Sleep: Good  Physical Exam: Physical Exam Vitals and nursing note reviewed.  Constitutional:      General: She is not in acute distress. HENT:     Head: Normocephalic.     Nose: Nose normal.  Cardiovascular:     Pulses: Normal pulses.     Comments: Blood Pressure 176/84 Pulmonary:     Effort: Pulmonary effort is normal. No respiratory distress.  Musculoskeletal:        General: Normal range of motion.     Cervical back: Normal range of motion.  Neurological:     Mental Status: She is alert and oriented to person, place, and time.  Psychiatric:        Attention and Perception: Attention normal. She does not perceive auditory or visual hallucinations.        Mood and Affect: Mood and affect normal.        Speech: Speech normal.        Behavior: Behavior is cooperative.         Thought Content: Thought content is not paranoid or delusional. Thought content does not include homicidal or suicidal ideation.        Cognition and Memory: Cognition normal. Memory is impaired.    Review of Systems  Respiratory:  Negative for shortness of breath.   Psychiatric/Behavioral: Negative.    All other systems reviewed and are negative.  Blood pressure (!) 143/71, pulse 87, temperature 97.6 F (36.4 C), temperature source Axillary, resp. rate 16, height 5\' 7"  (1.702 m), weight 64.4 kg, SpO2 97%. Body mass index is 22.24 kg/m.  Treatment Plan Summary: 73 year old female patient presents with an isolated incident of hearing voices. She reports that on Saturday, she heard her barber's voice on the television but has not experienced any auditory hallucinations since then.  She is not suicidal or homicidal, nor is there indication of psychiatric impairment at this time. She currently does not meet criteria for involuntary commitment. Patent is cleared from a psychiatric perspective. The psychiatry team has attempted to locate contact information for her niece, Kristie Hoover and nephew/son, [name] Little to no avail. She says she does not know their numbers as they are programmed in her phone (landline). She says that her car is currently at the Police Dept. Transition of Care consult recommended to assist with locating family to ensure a safe discharge.   - Support System: Please ensure you have a way to contact your niece Kristie Hoover and your nephew [name] in case of need. - Safety and Well-being: You mentioned feeling safe in your neighborhood, which is very positive. Continue to engage with your community and support network. - Memory Support: Try to engage in activities that stimulate your memory, such as puzzles or reading, and continue to interact socially as much as possible.    Disposition: No evidence of imminent risk to self or others at present.   Patient does not meet criteria  for psychiatric inpatient admission. Supportive therapy provided about ongoing stressors. Discussed crisis plan, support from social network, calling 911, coming to the Emergency Department, and calling Suicide Hotline.  Norma Fredrickson, NP 11/01/2022 12:08 PM

## 2022-11-01 NOTE — ED Notes (Signed)
Pt walking to bathroom with steady gait-

## 2022-11-01 NOTE — ED Notes (Signed)
Pt alert and pleasantly confused. Pt walked to bathroom. Pt is calm and cooperative.

## 2022-11-01 NOTE — ED Notes (Signed)
Pt is laying in the bed with her eyes closed but does not appear to be asleep.

## 2022-11-01 NOTE — TOC CM/SW Note (Addendum)
Cm received consult for pt regarding can't get a hold of family and safe d/c plan. Cm called PCP office Dr. Dewaine Oats and spoke with representative regarding. Rep stated she willl give me a call regarding information.  1349-Pt stated she does not have any children nor married. She has a lot of nieces and nephews that she does not have contact information for. She does not have cell number with contact information. Cm made APS report to [facility]. Gave them required information and [name] stated she would sent to her supervisor. Cm also received a call from Dr. Dewaine Oats and he stated that pt is no longer a pt of his at this time. He will try look in chart to see if there is any contact information tomorrow.

## 2022-11-01 NOTE — ED Notes (Signed)
Pt eating lunch. Will change into own clothes once finished.

## 2022-11-23 ENCOUNTER — Other Ambulatory Visit: Payer: Self-pay | Admitting: Student

## 2022-11-23 DIAGNOSIS — R413 Other amnesia: Secondary | ICD-10-CM

## 2022-11-28 ENCOUNTER — Emergency Department: Payer: 59

## 2022-11-28 ENCOUNTER — Encounter: Payer: Self-pay | Admitting: Internal Medicine

## 2022-11-28 ENCOUNTER — Inpatient Hospital Stay
Admission: EM | Admit: 2022-11-28 | Discharge: 2022-12-10 | DRG: 637 | Disposition: A | Discharge status: Skilled Nursing Facility | Payer: 59 | Attending: Internal Medicine | Admitting: Internal Medicine

## 2022-11-28 DIAGNOSIS — D638 Anemia in other chronic diseases classified elsewhere: Secondary | ICD-10-CM | POA: Diagnosis present

## 2022-11-28 DIAGNOSIS — T501X5A Adverse effect of loop [high-ceiling] diuretics, initial encounter: Secondary | ICD-10-CM | POA: Diagnosis present

## 2022-11-28 DIAGNOSIS — E8809 Other disorders of plasma-protein metabolism, not elsewhere classified: Secondary | ICD-10-CM | POA: Diagnosis present

## 2022-11-28 DIAGNOSIS — F22 Delusional disorders: Secondary | ICD-10-CM

## 2022-11-28 DIAGNOSIS — E7800 Pure hypercholesterolemia, unspecified: Secondary | ICD-10-CM | POA: Diagnosis present

## 2022-11-28 DIAGNOSIS — F03918 Unspecified dementia, unspecified severity, with other behavioral disturbance: Secondary | ICD-10-CM | POA: Diagnosis present

## 2022-11-28 DIAGNOSIS — Z885 Allergy status to narcotic agent status: Secondary | ICD-10-CM

## 2022-11-28 DIAGNOSIS — G9341 Metabolic encephalopathy: Secondary | ICD-10-CM | POA: Diagnosis present

## 2022-11-28 DIAGNOSIS — Z6824 Body mass index (BMI) 24.0-24.9, adult: Secondary | ICD-10-CM | POA: Diagnosis not present

## 2022-11-28 DIAGNOSIS — Z888 Allergy status to other drugs, medicaments and biological substances status: Secondary | ICD-10-CM

## 2022-11-28 DIAGNOSIS — U071 COVID-19: Secondary | ICD-10-CM | POA: Diagnosis present

## 2022-11-28 DIAGNOSIS — M1990 Unspecified osteoarthritis, unspecified site: Secondary | ICD-10-CM | POA: Diagnosis present

## 2022-11-28 DIAGNOSIS — G9340 Encephalopathy, unspecified: Secondary | ICD-10-CM

## 2022-11-28 DIAGNOSIS — R440 Auditory hallucinations: Secondary | ICD-10-CM | POA: Diagnosis present

## 2022-11-28 DIAGNOSIS — E11641 Type 2 diabetes mellitus with hypoglycemia with coma: Secondary | ICD-10-CM | POA: Diagnosis not present

## 2022-11-28 DIAGNOSIS — R34 Anuria and oliguria: Secondary | ICD-10-CM | POA: Diagnosis not present

## 2022-11-28 DIAGNOSIS — F0392 Unspecified dementia, unspecified severity, with psychotic disturbance: Secondary | ICD-10-CM | POA: Diagnosis present

## 2022-11-28 DIAGNOSIS — Z79899 Other long term (current) drug therapy: Secondary | ICD-10-CM

## 2022-11-28 DIAGNOSIS — I10 Essential (primary) hypertension: Secondary | ICD-10-CM | POA: Diagnosis present

## 2022-11-28 DIAGNOSIS — N179 Acute kidney failure, unspecified: Principal | ICD-10-CM

## 2022-11-28 DIAGNOSIS — K219 Gastro-esophageal reflux disease without esophagitis: Secondary | ICD-10-CM | POA: Diagnosis present

## 2022-11-28 DIAGNOSIS — Z981 Arthrodesis status: Secondary | ICD-10-CM

## 2022-11-28 DIAGNOSIS — D494 Neoplasm of unspecified behavior of bladder: Secondary | ICD-10-CM | POA: Diagnosis present

## 2022-11-28 DIAGNOSIS — E440 Moderate protein-calorie malnutrition: Secondary | ICD-10-CM | POA: Diagnosis present

## 2022-11-28 DIAGNOSIS — N170 Acute kidney failure with tubular necrosis: Secondary | ICD-10-CM | POA: Diagnosis present

## 2022-11-28 DIAGNOSIS — Z7982 Long term (current) use of aspirin: Secondary | ICD-10-CM

## 2022-11-28 DIAGNOSIS — M5136 Other intervertebral disc degeneration, lumbar region: Secondary | ICD-10-CM | POA: Diagnosis present

## 2022-11-28 DIAGNOSIS — D649 Anemia, unspecified: Secondary | ICD-10-CM | POA: Insufficient documentation

## 2022-11-28 DIAGNOSIS — E872 Acidosis, unspecified: Secondary | ICD-10-CM | POA: Diagnosis present

## 2022-11-28 DIAGNOSIS — E876 Hypokalemia: Secondary | ICD-10-CM | POA: Diagnosis present

## 2022-11-28 DIAGNOSIS — Z90710 Acquired absence of both cervix and uterus: Secondary | ICD-10-CM

## 2022-11-28 DIAGNOSIS — F0390 Unspecified dementia without behavioral disturbance: Secondary | ICD-10-CM

## 2022-11-28 DIAGNOSIS — Z7951 Long term (current) use of inhaled steroids: Secondary | ICD-10-CM

## 2022-11-28 DIAGNOSIS — Z803 Family history of malignant neoplasm of breast: Secondary | ICD-10-CM

## 2022-11-28 DIAGNOSIS — E8729 Other acidosis: Secondary | ICD-10-CM

## 2022-11-28 DIAGNOSIS — J45909 Unspecified asthma, uncomplicated: Secondary | ICD-10-CM | POA: Diagnosis present

## 2022-11-28 DIAGNOSIS — Z602 Problems related to living alone: Secondary | ICD-10-CM | POA: Diagnosis present

## 2022-11-28 DIAGNOSIS — Z96612 Presence of left artificial shoulder joint: Secondary | ICD-10-CM | POA: Diagnosis present

## 2022-11-28 DIAGNOSIS — Z881 Allergy status to other antibiotic agents status: Secondary | ICD-10-CM

## 2022-11-28 LAB — URINE DRUG SCREEN, QUALITATIVE (ARMC ONLY)
Amphetamines, Ur Screen: NOT DETECTED
Barbiturates, Ur Screen: NOT DETECTED
Benzodiazepine, Ur Scrn: NOT DETECTED
Cannabinoid 50 Ng, Ur ~~LOC~~: NOT DETECTED
Cocaine Metabolite,Ur ~~LOC~~: NOT DETECTED
MDMA (Ecstasy)Ur Screen: NOT DETECTED
Methadone Scn, Ur: NOT DETECTED
Opiate, Ur Screen: NOT DETECTED
Phencyclidine (PCP) Ur S: NOT DETECTED
Tricyclic, Ur Screen: POSITIVE — AB

## 2022-11-28 LAB — COMPREHENSIVE METABOLIC PANEL
ALT: 11 U/L (ref 0–44)
AST: 21 U/L (ref 15–41)
Albumin: 2.3 g/dL — ABNORMAL LOW (ref 3.5–5.0)
Alkaline Phosphatase: 87 U/L (ref 38–126)
Anion gap: 20 — ABNORMAL HIGH (ref 5–15)
BUN: 40 mg/dL — ABNORMAL HIGH (ref 8–23)
CO2: 22 mmol/L (ref 22–32)
Calcium: 7.2 mg/dL — ABNORMAL LOW (ref 8.9–10.3)
Chloride: 99 mmol/L (ref 98–111)
Creatinine, Ser: 6.56 mg/dL — ABNORMAL HIGH (ref 0.44–1.00)
GFR, Estimated: 6 mL/min — ABNORMAL LOW (ref >60)
Glucose, Bld: 140 mg/dL — ABNORMAL HIGH (ref 70–99)
Potassium: 2.5 mmol/L — CL (ref 3.5–5.1)
Sodium: 141 mmol/L (ref 135–145)
Total Bilirubin: 1.1 mg/dL (ref 0.3–1.2)
Total Protein: 6.8 g/dL (ref 6.5–8.1)

## 2022-11-28 LAB — CBC WITH DIFFERENTIAL/PLATELET
Abs Immature Granulocytes: 0.05 10*3/uL (ref 0.00–0.07)
Basophils Absolute: 0 10*3/uL (ref 0.0–0.1)
Basophils Relative: 0 %
Eosinophils Absolute: 0 10*3/uL (ref 0.0–0.5)
Eosinophils Relative: 0 %
HCT: 34.2 % — ABNORMAL LOW (ref 36.0–46.0)
Hemoglobin: 11.5 g/dL — ABNORMAL LOW (ref 12.0–15.0)
Immature Granulocytes: 0 %
Lymphocytes Relative: 8 %
Lymphs Abs: 1 10*3/uL (ref 0.7–4.0)
MCH: 28.5 pg (ref 26.0–34.0)
MCHC: 33.6 g/dL (ref 30.0–36.0)
MCV: 84.7 fL (ref 80.0–100.0)
Monocytes Absolute: 0.9 10*3/uL (ref 0.1–1.0)
Monocytes Relative: 6 %
Neutro Abs: 11.9 10*3/uL — ABNORMAL HIGH (ref 1.7–7.7)
Neutrophils Relative %: 86 %
Platelets: 279 10*3/uL (ref 150–400)
RBC: 4.04 MIL/uL (ref 3.87–5.11)
RDW: 15.7 % — ABNORMAL HIGH (ref 11.5–15.5)
WBC: 13.9 10*3/uL — ABNORMAL HIGH (ref 4.0–10.5)
nRBC: 0 % (ref 0.0–0.2)

## 2022-11-28 LAB — URINALYSIS, W/ REFLEX TO CULTURE (INFECTION SUSPECTED)
Glucose, UA: NEGATIVE mg/dL
Hgb urine dipstick: NEGATIVE
Ketones, ur: NEGATIVE mg/dL
Leukocytes,Ua: NEGATIVE
Nitrite: NEGATIVE
Protein, ur: 100 mg/dL — AB
Specific Gravity, Urine: 1.017 (ref 1.005–1.030)
pH: 5 (ref 5.0–8.0)

## 2022-11-28 LAB — CBG MONITORING, ED
Glucose-Capillary: 107 mg/dL — ABNORMAL HIGH (ref 70–99)
Glucose-Capillary: 23 mg/dL — CL (ref 70–99)

## 2022-11-28 LAB — RESP PANEL BY RT-PCR (RSV, FLU A&B, COVID)  RVPGX2
Influenza A by PCR: NEGATIVE
Influenza B by PCR: NEGATIVE
Resp Syncytial Virus by PCR: NEGATIVE
SARS Coronavirus 2 by RT PCR: POSITIVE — AB

## 2022-11-28 LAB — GLUCOSE, CAPILLARY
Glucose-Capillary: 165 mg/dL — ABNORMAL HIGH (ref 70–99)
Glucose-Capillary: 41 mg/dL — CL (ref 70–99)
Glucose-Capillary: 127 mg/dL — ABNORMAL HIGH (ref 70–99)

## 2022-11-28 LAB — LACTIC ACID, PLASMA: Lactic Acid, Venous: 1.3 mmol/L (ref 0.5–1.9)

## 2022-11-28 LAB — AMMONIA: Ammonia: 15 umol/L (ref 9–35)

## 2022-11-28 LAB — SALICYLATE LEVEL: Salicylate Lvl: <7 mg/dL — ABNORMAL LOW (ref 7.0–30.0)

## 2022-11-28 LAB — TROPONIN I (HIGH SENSITIVITY)
Troponin I (High Sensitivity): 30 ng/L — ABNORMAL HIGH (ref <18)
Troponin I (High Sensitivity): 35 ng/L — ABNORMAL HIGH (ref <18)

## 2022-11-28 LAB — ACETAMINOPHEN LEVEL: Acetaminophen (Tylenol), Serum: <10 ug/mL — ABNORMAL LOW (ref 10–30)

## 2022-11-28 LAB — PHOSPHORUS: Phosphorus: 5.4 mg/dL — ABNORMAL HIGH (ref 2.5–4.6)

## 2022-11-28 LAB — MAGNESIUM: Magnesium: 1 mg/dL — ABNORMAL LOW (ref 1.7–2.4)

## 2022-11-28 MED ORDER — ONDANSETRON HCL 4 MG/2ML IJ SOLN
4.0000 mg | Freq: Four times a day (QID) | INTRAMUSCULAR | Status: DC | PRN
  Administered 2022-11-29 – 2022-12-05 (×2): 4 mg via INTRAVENOUS
  Filled 2022-11-28 (×2): qty 2

## 2022-11-28 MED ORDER — SODIUM CHLORIDE 0.9 % IV SOLN: INTRAVENOUS | Status: DC

## 2022-11-28 MED ORDER — ACETAMINOPHEN 650 MG RE SUPP: 650.0000 mg | Freq: Four times a day (QID) | RECTAL | Status: DC | PRN

## 2022-11-28 MED ORDER — DEXTROSE 50 % IV SOLN
1.0000 | Freq: Once | INTRAVENOUS | Status: AC
  Administered 2022-11-28: 50 mL via INTRAVENOUS
  Filled 2022-11-28: qty 50

## 2022-11-28 MED ORDER — ENOXAPARIN SODIUM 40 MG/0.4ML IJ SOSY: 40.0000 mg | PREFILLED_SYRINGE | INTRAMUSCULAR | Status: DC

## 2022-11-28 MED ORDER — ONDANSETRON HCL 4 MG PO TABS: 4.0000 mg | ORAL_TABLET | Freq: Four times a day (QID) | ORAL | Status: DC | PRN

## 2022-11-28 MED ORDER — DEXTROSE 10 % IV SOLN: INTRAVENOUS | Status: DC

## 2022-11-28 MED ORDER — LACTATED RINGERS IV BOLUS
1000.0000 mL | Freq: Once | INTRAVENOUS | Status: AC
  Administered 2022-11-28: 1000 mL via INTRAVENOUS

## 2022-11-28 MED ORDER — POTASSIUM CHLORIDE CRYS ER 20 MEQ PO TBCR: 40.0000 meq | EXTENDED_RELEASE_TABLET | Freq: Once | ORAL | Status: DC

## 2022-11-28 MED ORDER — HYDROCODONE-ACETAMINOPHEN 5-325 MG PO TABS
1.0000 | ORAL_TABLET | ORAL | Status: DC | PRN
  Administered 2022-12-01 – 2022-12-02 (×2): 2 via ORAL
  Filled 2022-11-28 (×2): qty 2

## 2022-11-28 MED ORDER — ACETAMINOPHEN 325 MG PO TABS: 650.0000 mg | ORAL_TABLET | Freq: Four times a day (QID) | ORAL | Status: DC | PRN

## 2022-11-28 MED ORDER — DEXTROSE 50 % IV SOLN
1.0000 | INTRAVENOUS | Status: DC | PRN
  Administered 2022-11-28: 50 mL via INTRAVENOUS
  Administered 2022-11-29: 25 mL via INTRAVENOUS
  Filled 2022-11-28 (×3): qty 50

## 2022-11-28 MED ORDER — PRAVASTATIN SODIUM 20 MG PO TABS: 40.0000 mg | ORAL_TABLET | ORAL | Status: DC

## 2022-11-28 MED ORDER — HEPARIN SODIUM (PORCINE) 5000 UNIT/ML IJ SOLN
5000.0000 [IU] | Freq: Three times a day (TID) | INTRAMUSCULAR | Status: DC
  Administered 2022-11-28 – 2022-12-10 (×36): 5000 [IU] via SUBCUTANEOUS
  Filled 2022-11-28 (×36): qty 1

## 2022-11-28 MED ORDER — MAGNESIUM SULFATE 4 GM/100ML IV SOLN
4.0000 g | INTRAVENOUS | Status: AC
  Administered 2022-11-28 – 2022-11-29 (×2): 4 g via INTRAVENOUS
  Filled 2022-11-28 (×2): qty 100

## 2022-11-28 MED ORDER — POTASSIUM CHLORIDE 10 MEQ/100ML IV SOLN
10.0000 meq | INTRAVENOUS | Status: AC
  Administered 2022-11-28 (×2): 10 meq via INTRAVENOUS
  Filled 2022-11-28 (×2): qty 100

## 2022-11-28 MED ORDER — PANTOPRAZOLE SODIUM 40 MG PO TBEC: 40.0000 mg | DELAYED_RELEASE_TABLET | ORAL | Status: DC

## 2022-11-28 NOTE — Assessment & Plan Note (Signed)
Continue pravastatin 

## 2022-11-28 NOTE — ED Triage Notes (Signed)
Pt from home lives alone. Family went to visit pt and pt did not answer, called 911. Pt was unresponsive and give 8 of narcan no response, per EMS firefighter had to bag pt, arrives on RA. Pt blood sugar was 23 on arrival, and given 250 mL D10, blood sugar 183 now per EMS. Pt alert at this time

## 2022-11-28 NOTE — Assessment & Plan Note (Signed)
Continue Advair and singulair

## 2022-11-28 NOTE — ED Notes (Signed)
Pt hallucinating people in the room

## 2022-11-28 NOTE — Assessment & Plan Note (Addendum)
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

## 2022-11-28 NOTE — Assessment & Plan Note (Signed)
Had potassium repletion IV in the ER Continue caution as oral and IV repletion with ongoing lab monitoring Pharmacy consult for electrolyte management Magnesium added on Continuous cardiac monitoring

## 2022-11-28 NOTE — Assessment & Plan Note (Signed)
Hold furosemide due to renal function

## 2022-11-28 NOTE — H&P (Incomplete)
History and Physical    Patient: Kristie Hoover AOZ:[id number] DOB: 27-Nov-1950 DOA: 11/28/2022 DOS: the patient was seen and examined on 11/28/2022 PCP: [facility]  Patient coming from: Home  Chief Complaint:  Chief Complaint  Patient presents with   Hypoglycemia    HPI: Kristie Hoover is a 72 y.o. female with medical history significant for DM, HTN, HLD, recently evaluated by neurologist Dr. Malvin Johns on 11/22/2022 with a new diagnosis of memory impairment and delusional disorder after being evaluated for for 72-month history of progressive memory loss, delusions about property being stolen and concern for medication compliance.  She was started on risperidone.  Who presents to the ED after 911 found her unresponsive at home.  Family ha been trying to get in touch with her with no response.  EMS administered 8 mg of Narcan with no response but blood sugar was 23 on their arrival and she was administered D10, to which she responded and became alert with blood sugar improving to 183 by arrival to the ED. of note, a month prior in August 2024, patient was evaluated in the ED by psychiatry after due to "concerns for confusion, paranoia, hearing voices, poor memory "  ED course andData review: Vitals for the most part within normal limits. Labs: Covid positive Ammonia normal, acetaminophen and salicylate acid levels undetectable CMP: Creatinine 6.56 (baseline 0.8 for a month prior) with anion gap 20, bicarb 22.  Potassium 2.5, calcium 7.2 CBC: WBC 13.9 and hemoglobin 11.5 which is above baseline Lactic acid 1.3 Urinalysis not consistent with UTI but showing moderate bilirubin Troponin 30 EKG, personally viewed and interpreted showing sinus at 76 with no acute ST-T wave changes CT head nonacute Chest x-ray nonacute Renal ultrasound pending  Patient was treated with IV potassium and an LR bolus Patient was visually hallucinating while in the ED seeing people in the room The ED provider  spoke with nephrologist, Dr. Lourdes Sledge who recommended renal ultrasound (currently pending) and will see in the a.m. Hospitalist consulted for admission.   Review of Systems: {ROS_Text:[id number]}  Past Medical History:  Diagnosis Date   Anemia    Arthritis    Asthma    WELL CONTROLLED   DDD (degenerative disc disease), lumbar    Diabetes mellitus without complication (HCC)    GERD (gastroesophageal reflux disease)    History of hiatal hernia    Hypercholesterolemia    Hypertension    NO MEDS   Kidney infection    Pneumonia    @ 60 months old   Wears dentures    full upper, partial lower   Past Surgical History:  Procedure Laterality Date   ABDOMINAL HYSTERECTOMY     APPENDECTOMY     CARPAL TUNNEL RELEASE Right    CATARACT EXTRACTION W/PHACO Right 08/15/2021   Procedure: CATARACT EXTRACTION PHACO AND INTRAOCULAR LENS PLACEMENT (IOC) RIGHT DIABETIC;  Surgeon: Galen Manila, MD;  Location: [facility];  Service: Ophthalmology;  Laterality: Right;  6.40 0:49.8   CATARACT EXTRACTION W/PHACO Left 08/29/2021   Procedure: CATARACT EXTRACTION PHACO AND INTRAOCULAR LENS PLACEMENT (IOC) LEFT DIABETIC 8.21 00:52.9;  Surgeon: Galen Manila, MD;  Location: [facility];  Service: Ophthalmology;  Laterality: Left;  Diabetic   COLONOSCOPY     COLONOSCOPY WITH PROPOFOL N/A 08/19/2017   Procedure: COLONOSCOPY WITH PROPOFOL;  Surgeon: Scot Jun, MD;  Location: [facility] END[facility];  Service: Endoscopy;  Laterality: N/A;   CYSTOCELE REPAIR N/A 03/31/2020   Procedure: ANTERIOR REPAIR (CYSTOCELE);  Surgeon: Tiburcio Pea,  Harrel Lemon, MD;  Location: [facility] ORS;  Service: Gynecology;  Laterality: N/A;   ESOPHAGOGASTRODUODENOSCOPY     ESOPHAGOGASTRODUODENOSCOPY (EGD) WITH PROPOFOL N/A 08/19/2017   Procedure: ESOPHAGOGASTRODUODENOSCOPY (EGD) WITH PROPOFOL;  Surgeon: Scot Jun, MD;  Location: [facility] ENDOSCOPY;  Service: Endoscopy;  Laterality: N/A;   LAMINECTOMY  1998   NO METAL   RECTOCELE  REPAIR N/A 03/31/2020   Procedure: POSTERIOR REPAIR (RECTOCELE);  Surgeon: Nadara Mustard, MD;  Location: [facility] OR[facility];  Service: Gynecology;  Laterality: N/A;   REVERSE SHOULDER ARTHROPLASTY Left 08/11/2020   Procedure: REVERSE SHOULDER ARTHROPLASTY;  Surgeon: Christena Flake, MD;  Location: [facility] ORS;  Service: Orthopedics;  Laterality: Left;   WRIST FUSION Right 1993   METAL PLATE AND SCREWS HAVE BEEN REMOVED   Social History:  reports that she has never smoked. She has never used smokeless tobacco. She reports that she does not drink alcohol and does not use drugs.  Allergies  Allergen Reactions   Oxycodone-Acetaminophen     Hallucinations    Prednisone Hives   Tramadol Other (See Comments)    Hallucinations   Chlorhexidine Gluconate Itching    Chg soap   Erythromycin Rash    Family History  Problem Relation Age of Onset   Breast cancer Other     Prior to Admission medications   Medication Sig Start Date End Date Taking? Authorizing Provider  acetaminophen (TYLENOL) 500 MG tablet Take 1,000 mg by mouth every 6 (six) hours as needed for moderate pain or headache. Patient not taking: Reported on 06/05/2022    [provider]  aspirin EC 81 MG tablet Take 81 mg by mouth daily. Swallow whole.    [provider]  Biotin 1000 MCG CHEW Chew 2,000 mcg by mouth daily.    [provider]  cyclobenzaprine (FLEXERIL) 10 MG tablet Take 10 mg by mouth every morning. AND PRN    [provider]  etodolac (LODINE) 400 MG tablet Take 400 mg by mouth 2 (two) times daily.    [provider]  Fluticasone-Salmeterol (ADVAIR) 100-50 MCG/DOSE AEPB Inhale 1 puff into the lungs in the morning and at bedtime. Inhale 1 puff twice daily may take a third puff at bedtime as needed for shortness of breath    [provider]  furosemide (LASIX) 40 MG tablet Take 40 mg by mouth daily. 01/17/17   [provider]  glipiZIDE-metformin (METAGLIP) 2.5-500 MG  tablet Take 1 tablet by mouth daily before breakfast.    [provider]  latanoprost (XALATAN) 0.005 % ophthalmic solution Place 1 drop into both eyes at bedtime. 02/02/20   [provider]  montelukast (SINGULAIR) 10 MG tablet Take 10 mg by mouth every morning.    [provider]  Multiple Vitamin (MULTIVITAMIN WITH MINERALS) TABS tablet Take 1 tablet by mouth daily.    [provider]  nepafenac (ILEVRO) 0.3 % ophthalmic suspension  07/31/21   [provider]  Netarsudil Dimesylate (RHOPRESSA) 0.02 % SOLN  09/28/21   [provider]  pantoprazole (PROTONIX) 40 MG tablet Take 40 mg by mouth every morning.    [provider]  potassium chloride SA (K-DUR,KLOR-CON) 20 MEQ tablet Take 20 mEq by mouth 2 (two) times daily.    [provider]  pravastatin (PRAVACHOL) 40 MG tablet Take 40 mg by mouth every morning.    [provider]    Physical Exam: Vitals:   11/28/22 1752 11/28/22 1800 11/28/22 1930  BP: (!) 159/92 Marland Kitchen)  156/80 125/73  Pulse: 73 79 76  Resp: 16 (!) 23 20  Temp:   97.7 F (36.5 C)  TempSrc:   Oral  SpO2: 96% 98% 95%  Weight: 64 kg    Height: 5\' 7"  (1.702 m)     Physical Exam  Labs on Admission: I have personally reviewed following labs and imaging studies  CBC: Recent Labs  Lab 11/28/22 1803  WBC 13.9*  NEUTROABS 11.9*  HGB 11.5*  HCT 34.2*  MCV 84.7  PLT 279   Basic Metabolic Panel: Recent Labs  Lab 11/28/22 1803  NA 141  K 2.5*  CL 99  CO2 22  GLUCOSE 140*  BUN 40*  CREATININE 6.56*  CALCIUM 7.2*   GFR: Estimated Creatinine Clearance: 7.3 mL/min (A) (by C-G formula based on SCr of 6.56 mg/dL (H)). Liver Function Tests: Recent Labs  Lab 11/28/22 1803  AST 21  ALT 11  ALKPHOS 87  BILITOT 1.1  PROT 6.8  ALBUMIN 2.3*   No results for input(s): "LIPASE", "AMYLASE" in the last 168 hours. Recent Labs  Lab 11/28/22 1812  AMMONIA 15   Coagulation Profile: No  results for input(s): "INR", "PROTIME" in the last 168 hours. Cardiac Enzymes: No results for input(s): "CKTOTAL", "CKMB", "CKMBINDEX", "TROPONINI" in the last 168 hours. BNP (last 3 results) No results for input(s): "PROBNP" in the last 8760 hours. HbA1C: No results for input(s): "HGBA1C" in the last 72 hours. CBG: Recent Labs  Lab 11/28/22 1745  GLUCAP 107*   Lipid Profile: No results for input(s): "CHOL", "HDL", "LDLCALC", "TRIG", "CHOLHDL", "LDLDIRECT" in the last 72 hours. Thyroid Function Tests: No results for input(s): "TSH", "T4TOTAL", "FREET4", "T3FREE", "THYROIDAB" in the last 72 hours. Anemia Panel: No results for input(s): "VITAMINB12", "FOLATE", "FERRITIN", "TIBC", "IRON", "RETICCTPCT" in the last 72 hours. Urine analysis:    Component Value Date/Time   COLORURINE AMBER (A) 11/28/2022 1839   APPEARANCEUR CLOUDY (A) 11/28/2022 1839   LABSPEC 1.017 11/28/2022 1839   PHURINE 5.0 11/28/2022 1839   GLUCOSEU NEGATIVE 11/28/2022 1839   HGBUR NEGATIVE 11/28/2022 1839   BILIRUBINUR MODERATE (A) 11/28/2022 1839   KETONESUR NEGATIVE 11/28/2022 1839   PROTEINUR 100 (A) 11/28/2022 1839   NITRITE NEGATIVE 11/28/2022 1839   LEUKOCYTESUR NEGATIVE 11/28/2022 1839    Radiological Exams on Admission: DG Chest Portable 1 View  Result Date: 11/28/2022 CLINICAL DATA:  Altered mental status, productive cough EXAM: PORTABLE CHEST 1 VIEW COMPARISON:  08/03/2011 FINDINGS: Cardiac and mediastinal contours are within normal limits. Aortic atherosclerosis. No focal pulmonary opacity. No pleural effusion or pneumothorax. No acute osseous abnormality. Status post interval left total shoulder arthroplasty. IMPRESSION: No acute cardiopulmonary process. Electronically Signed   By: Wiliam Ke M.D.   On: 11/28/2022 19:47   CT Head Wo Contrast  Result Date: 11/28/2022 CLINICAL DATA:  Altered mental status. EXAM: CT HEAD WITHOUT CONTRAST TECHNIQUE: Contiguous axial images were obtained from the  base of the skull through the vertex without intravenous contrast. RADIATION DOSE REDUCTION: This exam was performed according to the departmental dose-optimization program which includes automated exposure control, adjustment of the mA and/or kV according to patient size and/or use of iterative reconstruction technique. COMPARISON:  Head CT dated 11/01/2022. FINDINGS: Brain: The ventricles and sulci are appropriate size for the patient's age. The gray-Altier matter discrimination is preserved. There is no acute intracranial hemorrhage. No mass effect or midline shift. No extra-axial fluid collection. Vascular: No hyperdense vessel or unexpected calcification. Skull: Normal. Negative for fracture or focal lesion.  Sinuses/Orbits: No acute finding. Other: None IMPRESSION: No acute intracranial pathology. Electronically Signed   By: Elgie Collard M.D.   On: 11/28/2022 19:37     Data Reviewed: Relevant notes from primary care and specialist visits, past discharge summaries as available in EHR, including Care Everywhere. Prior diagnostic testing as pertinent to current admission diagnoses Updated medications and problem lists for reconciliation ED course, including vitals, labs, imaging, treatment and response to treatment Triage notes, nursing and pharmacy notes and ED provider's notes Notable results as noted in HPI   Assessment and Plan: * Uncontrolled diabetes mellitus with hypoglycemic coma, without long-term current use of insulin (HCC) Patient found unresponsive by EMS after family could not get in touch with her Blood sugar 23, responding to D10 by EMS with appropriate response becoming more alert Hold home glipizide metformin Will continue D10 infusion overnight CBG every hour in stepdown and can transition of D10 as blood sugars become more stable Keep n.p.o. until consistently more awake and able to safely swallow Aspiration precautions Neurologic checks  COVID-19 virus  infection Possible COVID encephalopathy COVID precautions Neurologic checks  Hypokalemia Had potassium repletion IV in the ER Continue caution as oral and IV repletion with ongoing lab monitoring Pharmacy consult for electrolyte management Magnesium added on Continuous cardiac monitoring  AKI (acute kidney injury) (HCC) High anion gap of 22 with normal bicarb Creatinine 6.56, baseline 0.89 just a month prior  Suspect secondary to dehydration and medication. Patient noted to be on etodolac, aspirin, metformin, furosemide.  Hold potentially nephrotoxic agents IV fluid resuscitation Follow-up renal ultrasound and monitor renal function Nephrology consulted  Hypocalcemia Hypoalbuminemia Calcium of 7.2 was Corrected calcium 8.6 for albumin of 2.3 Nutritionist consult  Auditory hallucinations Delusional disorder Dementia with behavioral disturbance Delirium precautions Blinds open during the day, closed at night, frequent reorientation, minimize nighttime interruptions When possible, minimize use of narcotics, benzodiazepines, anti cholingeneric, and antihistamine If delirium becomes problematic,Haldol 0.5-1 mg q6/prn.  Geodon , if needed - 10 mg q8 PRN severe agitation  When safe to swallow, resume risperdal started by neurologist, Dr Malvin Johns on 11/22/22 Can consider psych consult while in house   HTN (hypertension) Hold furosemide due to renal function  Asthma Continue Advair and singulair  Hypercholesterolemia Continue pravastatin  Anemia Appears chronic Follow anemia panel    DVT prophylaxis: Lovenox  Consults: renal  Advance Care Planning:   Code Status: Prior   Family Communication: none  Disposition Plan: Back to previous home environment  Severity of Illness: The appropriate patient status for this patient is INPATIENT. Inpatient status is judged to be reasonable and necessary in order to provide the required intensity of service to ensure the patient's  safety. The patient's presenting symptoms, physical exam findings, and initial radiographic and laboratory data in the context of their chronic comorbidities is felt to place them at high risk for further clinical deterioration. Furthermore, it is not anticipated that the patient will be medically stable for discharge from the hospital within 2 midnights of admission.   * I certify that at the point of admission it is my clinical judgment that the patient will require inpatient hospital care spanning beyond 2 midnights from the point of admission due to high intensity of service, high risk for further deterioration and high frequency of surveillance required.*  Author: Andris Baumann, MD 11/28/2022 8:25 PM  For on call review [email or web address].

## 2022-11-28 NOTE — Assessment & Plan Note (Addendum)
Patient found unresponsive by EMS after family could not get in touch with her Blood sugar 23, responding to D10 by EMS with appropriate response becoming more alert Hold home glipizide metformin Will continue D10 infusion overnight CBG every hour in stepdown and can transition of D10 as blood sugars become more stable Keep n.p.o. until consistently more awake and able to safely swallow Aspiration precautions Neurologic checks

## 2022-11-28 NOTE — Progress Notes (Signed)
PHARMACY CONSULT NOTE - FOLLOW UP  Pharmacy Consult for Electrolyte Monitoring and Replacement   Recent Labs: Potassium (mmol/L)  Date Value  11/28/2022 2.5 (LL)   Magnesium (mg/dL)  Date Value  25/95/6387 1.0 (L)   Calcium (mg/dL)  Date Value  56/43/3295 7.2 (L)   Albumin (g/dL)  Date Value  18/84/1660 2.3 (L)   Phosphorus (mg/dL)  Date Value  63/03/6008 5.4 (H)   Sodium (mmol/L)  Date Value  11/28/2022 141     Assessment: 9/11:  Mag @ 2120 = 1.0  Goal of Therapy:  Electrolytes WNL   Plan:  Will order Mag Sulfate 4 gm IV X 2 doses Will recheck electrolytes on 9/12 with AM labs.   Scherrie Gerlach ,PharmD Clinical Pharmacist 11/28/2022 10:45 PM

## 2022-11-28 NOTE — Assessment & Plan Note (Addendum)
Delusional disorder Dementia with behavioral disturbance Delirium precautions Blinds open during the day, closed at night, frequent reorientation, minimize nighttime interruptions When possible, minimize use of narcotics, benzodiazepines, anti cholingeneric, and antihistamine If delirium becomes problematic,Haldol 0.5-1 mg q6/prn.  Geodon , if needed - 10 mg q8 PRN severe agitation  When safe to swallow, resume risperdal started by neurologist, Dr Malvin Johns on 11/22/22 Can consider psych consult while in house

## 2022-11-28 NOTE — ED Notes (Addendum)
ERP notified of critical potassium, see orders.

## 2022-11-28 NOTE — Assessment & Plan Note (Signed)
Hypoalbuminemia Calcium of 7.2 was Corrected calcium 8.6 for albumin of 2.3 Nutritionist consult

## 2022-11-28 NOTE — ED Notes (Signed)
Family at bedside, pt gone to CT

## 2022-11-28 NOTE — ED Provider Notes (Signed)
[facility] Provider Note    Event Date/Time   First MD Initiated Contact with Patient 11/28/22 1741     (approximate)   History   Hypoglycemia   HPI Kristie Hoover is a 72 y.o. female with dementia, HLD, HTN, DM2 presenting today for hypoglycemia.  Family called patient's residence where she lives alone and she did not answer.  EMS was called and found her unresponsive.  They initially gave her Narcan with no effect.  Found to have hypoglycemia with blood sugar in the 20s and was given dextrose.  Improvement in blood glucose and patient became more alert.  On arrival to the ED, patient is looking around the room and able to nod yes and no but otherwise not speaking.  Family in the room states she has been altered over the past day and is still not at her baseline.  Reportedly knows her name, where she is, and only has mild memory loss from the dementia and she has been talking significantly less over the past 2 days.  They deny any other obvious infectious symptoms or any recent falls.     Physical Exam   Triage Vital Signs: ED Triage Vitals  Encounter Vitals Group     BP      Systolic BP Percentile      Diastolic BP Percentile      Pulse      Resp      Temp      Temp src      SpO2      Weight      Height      Head Circumference      Peak Flow      Pain Score      Pain Loc      Pain Education      Exclude from Growth Chart     Most recent vital signs: Vitals:   11/28/22 1800 11/28/22 1930  BP: (!) 156/80 125/73  Pulse: 79 76  Resp: (!) 23 20  Temp:  97.7 F (36.5 C)  SpO2: 98% 95%    Physical Exam: I have reviewed the vital signs and nursing notes. General: Awake, alert, no acute distress.  Nontoxic appearing. Head:  Atraumatic, normocephalic.   ENT:  EOM intact, PERRL. Oral mucosa is pink and moist with no lesions. Neck: Neck is supple with full range of motion, No meningeal signs. Cardiovascular:  RRR, No murmurs. Peripheral  pulses palpable and equal bilaterally. Respiratory:  Symmetrical chest wall expansion.  Scattered rhonchi noted throughout all lung fields.  Good air movement throughout.  No use of accessory muscles.   Musculoskeletal:  No cyanosis or edema. Moving extremities with full ROM Abdomen:  Soft, nontender, nondistended. Neuro: Will nod yes and no.  Not talkative on exam.  Otherwise follows my commands.  No other focal neurological findings. Psych:  Calm, appropriate.   Skin:  Warm, dry, no rash.     ED Results / Procedures / Treatments   Labs (all labs ordered are listed, but only abnormal results are displayed) Labs Reviewed  RESP PANEL BY RT-PCR (RSV, FLU A&B, COVID)  [id number] - Abnormal; Notable for the following components:      Result Value   SARS Coronavirus 2 by RT PCR POSITIVE (*)    All other components within normal limits  COMPREHENSIVE METABOLIC PANEL - Abnormal; Notable for the following components:   Potassium 2.5 (*)    Glucose, Bld 140 (*)  BUN 40 (*)    Creatinine, Ser 6.56 (*)    Calcium 7.2 (*)    Albumin 2.3 (*)    GFR, Estimated 6 (*)    Anion gap 20 (*)    All other components within normal limits  CBC WITH DIFFERENTIAL/PLATELET - Abnormal; Notable for the following components:   WBC 13.9 (*)    Hemoglobin 11.5 (*)    HCT 34.2 (*)    RDW 15.7 (*)    Neutro Abs 11.9 (*)    All other components within normal limits  URINALYSIS, W/ REFLEX TO CULTURE (INFECTION SUSPECTED) - Abnormal; Notable for the following components:   Color, Urine AMBER (*)    APPearance CLOUDY (*)    Bilirubin Urine MODERATE (*)    Protein, ur 100 (*)    Bacteria, UA RARE (*)    All other components within normal limits  ACETAMINOPHEN LEVEL - Abnormal; Notable for the following components:   Acetaminophen (Tylenol), Serum <10 (*)    All other components within normal limits  SALICYLATE LEVEL - Abnormal; Notable for the following components:   Salicylate Lvl <7.0 (*)    All other  components within normal limits  CBG MONITORING, ED - Abnormal; Notable for the following components:   Glucose-Capillary 107 (*)    All other components within normal limits  TROPONIN I (HIGH SENSITIVITY) - Abnormal; Notable for the following components:   Troponin I (High Sensitivity) 30 (*)    All other components within normal limits  URINE CULTURE  AMMONIA  LACTIC ACID, PLASMA  URINE DRUG SCREEN, QUALITATIVE ([facility] ONLY)  TROPONIN I (HIGH SENSITIVITY)     EKG My EKG interpretation: Rate of 76, normal sinus rhythm, normal axis, normal intervals.  No acute ST elevations or depressions   RADIOLOGY Independently interpreted CT head and chest x-ray with no acute pathology   PROCEDURES:  Critical Care performed: Yes, see critical care procedure note(s)  .Critical Care  Performed by: Janith Lima, MD Authorized by: Janith Lima, MD   Critical care provider statement:    Critical care time (minutes):  35   Critical care was necessary to treat or prevent imminent or life-threatening deterioration of the following conditions:  Renal failure   Critical care was time spent personally by me on the following activities:  Development of treatment plan with patient or surrogate, discussions with consultants, evaluation of patient's response to treatment, examination of patient, ordering and review of laboratory studies, ordering and review of radiographic studies, ordering and performing treatments and interventions, pulse oximetry, re-evaluation of patient's condition and review of old charts   Care discussed with: admitting provider      MEDICATIONS ORDERED IN ED: Medications  potassium chloride 10 mEq in 100 mL IVPB (10 mEq Intravenous New Bag/Given 11/28/22 2024)  lactated ringers bolus 1,000 mL (1,000 mLs Intravenous New Bag/Given 11/28/22 1917)     IMPRESSION / MDM / ASSESSMENT AND PLAN / ED COURSE  I reviewed the triage vital signs and the nursing notes.                               Differential diagnosis includes, but is not limited to, dehydration, viral URI, pneumonia, CVA, polypharmacy, electrolyte abnormality, UTI.  Patient's presentation is most consistent with acute presentation with potential threat to life or bodily function.  Patient is a 72 year old female presenting today for acute encephalopathy in the setting of hypoglycemia.  Even  after correction of her low blood sugar, patient still altered from her baseline which is normally talkative and oriented.  Vital signs are stable.  Laboratory workup notable for new acute renal failure with creatinine of 6.56 and hypokalemia at 2.5.  No recent NSAID use or other medication change from her baseline.  Patient found to be COVID-positive and suspect profound poor p.o. intake as possible source of acute renal failure.  Patient was given 1 L fluid and potassium repletion.  CT head and chest x-ray unremarkable.  No evidence of UTI.  Discussed case with nephrology recommending renal ultrasound which was ordered.  Patient will be admitted to hospitalist for further management at this time.  The patient is on the cardiac monitor to evaluate for evidence of arrhythmia and/or significant heart rate changes. Clinical Course as of 11/28/22 2032  Wed Nov 28, 2022  1804 Glucose-Capillary(!): 107 [DW]  1832 WBC(!): 13.9 [DW]  1853 Troponin I (High Sensitivity)(!): 30 [DW]  1853 Potassium(!!): 2.5 [DW]  1853 Creatinine(!): 6.56 [DW]  1901 CT Head Wo Contrast Independently interpreted with no acute pathology [DW]  1902 DG Chest Portable 1 View Independently interpreted with no acute pathology [DW]  1909 Urinalysis, w/ Reflex to Culture (Infection Suspected) -Urine, Catheterized(!) No overt UTI seen at this time [DW]  1928 SARS Coronavirus 2 by RT PCR(!): POSITIVE [DW]  1943 CT Head Wo Contrast [DW]    Clinical Course User Index [DW] Janith Lima, MD     FINAL CLINICAL IMPRESSION(S) / ED DIAGNOSES   Final  diagnoses:  Acute renal failure, unspecified acute renal failure type (HCC)  Hypokalemia  COVID-19  Acute encephalopathy     Rx / DC Orders   ED Discharge Orders     None        Note:  This document was prepared using Dragon voice recognition software and may include unintentional dictation errors.   Janith Lima, MD 11/28/22 2034

## 2022-11-28 NOTE — Assessment & Plan Note (Signed)
Appears chronic Follow anemia panel

## 2022-11-28 NOTE — Assessment & Plan Note (Addendum)
High anion gap of 22 with normal bicarb Bladder mass on Renal ultrasound Creatinine 6.56, baseline 0.89 just a month prior  Suspect secondary to dehydration and medication. Patient noted to be on etodolac, aspirin, metformin, furosemide.  Hold potentially nephrotoxic agents IV fluid resuscitation Follow-up renal ultrasound and monitor renal function Nephrology consulted Addendum: US renal:IMPRESSION: 1. Large, heterogeneous posterior urinary bladder soft tissue mass, concerning for the presence of an underlying neoplasm. MRI correlation is recommended.  --Urology consulted

## 2022-11-29 DIAGNOSIS — N3289 Other specified disorders of bladder: Secondary | ICD-10-CM

## 2022-11-29 DIAGNOSIS — N179 Acute kidney failure, unspecified: Secondary | ICD-10-CM | POA: Diagnosis not present

## 2022-11-29 DIAGNOSIS — E440 Moderate protein-calorie malnutrition: Secondary | ICD-10-CM | POA: Insufficient documentation

## 2022-11-29 DIAGNOSIS — E11641 Type 2 diabetes mellitus with hypoglycemia with coma: Secondary | ICD-10-CM | POA: Diagnosis not present

## 2022-11-29 LAB — MAGNESIUM: Magnesium: 3.9 mg/dL — ABNORMAL HIGH (ref 1.7–2.4)

## 2022-11-29 LAB — CBC
HCT: 33.6 % — ABNORMAL LOW (ref 36.0–46.0)
Hemoglobin: 11.4 g/dL — ABNORMAL LOW (ref 12.0–15.0)
MCH: 28.6 pg (ref 26.0–34.0)
MCHC: 33.9 g/dL (ref 30.0–36.0)
MCV: 84.4 fL (ref 80.0–100.0)
Platelets: 311 10*3/uL (ref 150–400)
RBC: 3.98 MIL/uL (ref 3.87–5.11)
RDW: 15.8 % — ABNORMAL HIGH (ref 11.5–15.5)
WBC: 14.7 10*3/uL — ABNORMAL HIGH (ref 4.0–10.5)
nRBC: 0 % (ref 0.0–0.2)

## 2022-11-29 LAB — GLUCOSE, CAPILLARY
Glucose-Capillary: 100 mg/dL — ABNORMAL HIGH (ref 70–99)
Glucose-Capillary: 101 mg/dL — ABNORMAL HIGH (ref 70–99)
Glucose-Capillary: 105 mg/dL — ABNORMAL HIGH (ref 70–99)
Glucose-Capillary: 105 mg/dL — ABNORMAL HIGH (ref 70–99)
Glucose-Capillary: 115 mg/dL — ABNORMAL HIGH (ref 70–99)
Glucose-Capillary: 115 mg/dL — ABNORMAL HIGH (ref 70–99)
Glucose-Capillary: 115 mg/dL — ABNORMAL HIGH (ref 70–99)
Glucose-Capillary: 122 mg/dL — ABNORMAL HIGH (ref 70–99)
Glucose-Capillary: 153 mg/dL — ABNORMAL HIGH (ref 70–99)
Glucose-Capillary: 157 mg/dL — ABNORMAL HIGH (ref 70–99)
Glucose-Capillary: 172 mg/dL — ABNORMAL HIGH (ref 70–99)
Glucose-Capillary: 59 mg/dL — ABNORMAL LOW (ref 70–99)
Glucose-Capillary: 60 mg/dL — ABNORMAL LOW (ref 70–99)
Glucose-Capillary: 68 mg/dL — ABNORMAL LOW (ref 70–99)
Glucose-Capillary: 86 mg/dL (ref 70–99)
Glucose-Capillary: 87 mg/dL (ref 70–99)
Glucose-Capillary: 94 mg/dL (ref 70–99)
Glucose-Capillary: 94 mg/dL (ref 70–99)
Glucose-Capillary: 96 mg/dL (ref 70–99)

## 2022-11-29 LAB — BASIC METABOLIC PANEL
Anion gap: 18 — ABNORMAL HIGH (ref 5–15)
Anion gap: 18 — ABNORMAL HIGH (ref 5–15)
BUN: 41 mg/dL — ABNORMAL HIGH (ref 8–23)
BUN: 43 mg/dL — ABNORMAL HIGH (ref 8–23)
CO2: 21 mmol/L — ABNORMAL LOW (ref 22–32)
CO2: 23 mmol/L (ref 22–32)
Calcium: 6.9 mg/dL — ABNORMAL LOW (ref 8.9–10.3)
Calcium: 7.4 mg/dL — ABNORMAL LOW (ref 8.9–10.3)
Chloride: 100 mmol/L (ref 98–111)
Chloride: 101 mmol/L (ref 98–111)
Creatinine, Ser: 6.82 mg/dL — ABNORMAL HIGH (ref 0.44–1.00)
Creatinine, Ser: 6.95 mg/dL — ABNORMAL HIGH (ref 0.44–1.00)
GFR, Estimated: 6 mL/min — ABNORMAL LOW (ref >60)
GFR, Estimated: 6 mL/min — ABNORMAL LOW (ref >60)
Glucose, Bld: 112 mg/dL — ABNORMAL HIGH (ref 70–99)
Glucose, Bld: 131 mg/dL — ABNORMAL HIGH (ref 70–99)
Potassium: 2.7 mmol/L — CL (ref 3.5–5.1)
Potassium: 3.2 mmol/L — ABNORMAL LOW (ref 3.5–5.1)
Sodium: 139 mmol/L (ref 135–145)
Sodium: 142 mmol/L (ref 135–145)

## 2022-11-29 LAB — PHOSPHORUS: Phosphorus: 4 mg/dL (ref 2.5–4.6)

## 2022-11-29 LAB — MRSA NEXT GEN BY PCR, NASAL: MRSA by PCR Next Gen: NOT DETECTED

## 2022-11-29 MED ORDER — ENSURE ENLIVE PO LIQD
237.0000 mL | Freq: Three times a day (TID) | ORAL | Status: DC
  Administered 2022-11-29 – 2022-12-07 (×16): 237 mL via ORAL

## 2022-11-29 MED ORDER — CALCIUM GLUCONATE-NACL 1-0.675 GM/50ML-% IV SOLN
1.0000 g | Freq: Once | INTRAVENOUS | Status: AC
  Administered 2022-11-29: 1000 mg via INTRAVENOUS
  Filled 2022-11-29: qty 50

## 2022-11-29 MED ORDER — DEXTROSE 50 % IV SOLN
12.5000 g | Freq: Once | INTRAVENOUS | Status: AC
  Administered 2022-11-29: 12.5 g via INTRAVENOUS

## 2022-11-29 MED ORDER — ADULT MULTIVITAMIN W/MINERALS CH
1.0000 | ORAL_TABLET | Freq: Every day | ORAL | Status: DC
  Administered 2022-11-30 – 2022-12-10 (×10): 1 via ORAL
  Filled 2022-11-29 (×11): qty 1

## 2022-11-29 MED ORDER — POTASSIUM CHLORIDE 10 MEQ/100ML IV SOLN
10.0000 meq | INTRAVENOUS | Status: AC
  Administered 2022-11-29 (×4): 10 meq via INTRAVENOUS
  Filled 2022-11-29: qty 100

## 2022-11-29 MED ORDER — CALCIUM CARBONATE ANTACID 500 MG PO CHEW: 1000.0000 mg | CHEWABLE_TABLET | Freq: Once | ORAL | Status: DC

## 2022-11-29 MED ORDER — POTASSIUM CHLORIDE 10 MEQ/100ML IV SOLN
10.0000 meq | INTRAVENOUS | Status: AC
  Administered 2022-11-29 (×3): 10 meq via INTRAVENOUS
  Filled 2022-11-29 (×4): qty 100

## 2022-11-29 MED ORDER — ORAL CARE MOUTH RINSE: 15.0000 mL | OROMUCOSAL | Status: DC | PRN

## 2022-11-29 NOTE — Consult Note (Signed)
Urology Consult  I have been asked to see the patient by Dr. Para March, for evaluation and management of bladder mass with AKI.  Chief Complaint: AMS, hypoglycemia  History of Present Illness: Kristie Hoover is a 72 y.o. year old female with PMH diabetes, hypertension, hyperlipidemia, and recent diagnosis of memory impairment and delusional disorder in the context of several months of confusion, paranoia, auditory hallucinations, and poor memory who was brought in by EMS yesterday after wellness check showed her unresponsive with hypoglycemia.  She was found to be COVID-positive and has been admitted with hypoglycemic coma, hypokalemia, and AKI with creatinine 6.56, baseline 0.89.  Renal ultrasound yesterday showed no hydronephrosis but a large, 5.0 x 5.8 x 5.7 cm mass on the posterior aspect of the bladder.  UA notable for 11-20 WBCs/hpf, no hematuria, rare bacteria, and no nitrites.  Urine culture pending.  She is asleep but arousable on arrival.  She denies any bladder or kidney pain, dysuria, or gross hematuria.  I asked her where we are located and she states "now [place] anybody[place]  I do not see any other urology notes per chart review, however it does appear that she has a history of pelvic organ prolapse with anterior and posterior repair with Dr. Tiburcio Pea in 2022.  Past Medical History:  Diagnosis Date   Anemia    Arthritis    Asthma    WELL CONTROLLED   DDD (degenerative disc disease), lumbar    Diabetes mellitus without complication (HCC)    GERD (gastroesophageal reflux disease)    History of hiatal hernia    Hypercholesterolemia    Hypertension    NO MEDS   Kidney infection    Pneumonia    @ 43 months old   Wears dentures    full upper, partial lower    Past Surgical History:  Procedure Laterality Date   ABDOMINAL HYSTERECTOMY     APPENDECTOMY     CARPAL TUNNEL RELEASE Right    CATARACT EXTRACTION W/PHACO Right 08/15/2021   Procedure:  CATARACT EXTRACTION PHACO AND INTRAOCULAR LENS PLACEMENT (IOC) RIGHT DIABETIC;  Surgeon: Galen Manila, MD;  Location: [facility];  Service: Ophthalmology;  Laterality: Right;  6.40 0:49.8   CATARACT EXTRACTION W/PHACO Left 08/29/2021   Procedure: CATARACT EXTRACTION PHACO AND INTRAOCULAR LENS PLACEMENT (IOC) LEFT DIABETIC 8.21 00:52.9;  Surgeon: Galen Manila, MD;  Location: [facility];  Service: Ophthalmology;  Laterality: Left;  Diabetic   COLONOSCOPY     COLONOSCOPY WITH PROPOFOL N/A 08/19/2017   Procedure: COLONOSCOPY WITH PROPOFOL;  Surgeon: Scot Jun, MD;  Location: [facility];  Service: Endoscopy;  Laterality: N/A;   CYSTOCELE REPAIR N/A 03/31/2020   Procedure: ANTERIOR REPAIR (CYSTOCELE);  Surgeon: Nadara Mustard, MD;  Location: [facility] ORS;  Service: Gynecology;  Laterality: N/A;   ESOPHAGOGASTRODUODENOSCOPY     ESOPHAGOGASTRODUODENOSCOPY (EGD) WITH PROPOFOL N/A 08/19/2017   Procedure: ESOPHAGOGASTRODUODENOSCOPY (EGD) WITH PROPOFOL;  Surgeon: Scot Jun, MD;  Location: [facility] ENDOSCOPY;  Service: Endoscopy;  Laterality: N/A;   LAMINECTOMY  1998   NO METAL   RECTOCELE REPAIR N/A 03/31/2020   Procedure: POSTERIOR REPAIR (RECTOCELE);  Surgeon: Nadara Mustard, MD;  Location: [facility] ORS;  Service: Gynecology;  Laterality: N/A;   REVERSE SHOULDER ARTHROPLASTY Left 08/11/2020   Procedure: REVERSE SHOULDER ARTHROPLASTY;  Surgeon: Christena Flake, MD;  Location: [facility] ORS;  Service: Orthopedics;  Laterality: Left;   WRIST FUSION Right 1993   METAL PLATE AND  SCREWS HAVE BEEN REMOVED    Home Medications:  No outpatient medications have been marked as taking for the 11/28/22 encounter [facility] Encounter).    Allergies:  Allergies  Allergen Reactions   Oxycodone-Acetaminophen     Hallucinations    Prednisone Hives   Tramadol Other (See Comments)    Hallucinations   Chlorhexidine Gluconate Itching    Chg soap   Erythromycin Rash    Family History   Problem Relation Age of Onset   Breast cancer Other     Social History:  reports that she has never smoked. She has never used smokeless tobacco. She reports that she does not drink alcohol and does not use drugs.  ROS: A complete review of systems was performed.  All systems are negative except for pertinent findings as noted.  Physical Exam:  Vital signs in last 24 hours: Temp:  [97.6 F (36.4 C)-98.2 F (36.8 C)] 97.6 F (36.4 C) (09/12 0800) Pulse Rate:  [66-88] 86 (09/12 0800) Resp:  [9-27] 12 (09/12 0800) BP: (92-160)/(61-110) 143/77 (09/12 0800) SpO2:  [94 %-100 %] 98 % (09/12 0800) Weight:  [61.8 kg-64 kg] 61.8 kg (09/11 2130) Constitutional:  Alert and disoriented, no acute distress HEENT: [place] AT, moist mucus membranes Cardiovascular: No clubbing, cyanosis, or edema Respiratory: Normal respiratory effort Skin: No rashes, bruises or suspicious lesions Neurologic: Grossly intact, no focal deficits, moving all 4 extremities Psychiatric: Normal mood and affect  Laboratory Data:  Recent Labs    11/28/22 1803 11/29/22 0514  WBC 13.9* 14.7*  HGB 11.5* 11.4*  HCT 34.2* 33.6*   Recent Labs    11/28/22 1803 11/29/22 0020 11/29/22 0514  NA 141 142 139  K 2.5* 2.7* 3.2*  CL 99 101 100  CO2 22 23 21*  GLUCOSE 140* 112* 131*  BUN 40* 43* 41*  CREATININE 6.56* 6.95* 6.82*  CALCIUM 7.2* 6.9* 7.4*   Urinalysis    Component Value Date/Time   COLORURINE AMBER (A) 11/28/2022 1839   APPEARANCEUR CLOUDY (A) 11/28/2022 1839   LABSPEC 1.017 11/28/2022 1839   PHURINE 5.0 11/28/2022 1839   GLUCOSEU NEGATIVE 11/28/2022 1839   HGBUR NEGATIVE 11/28/2022 1839   BILIRUBINUR MODERATE (A) 11/28/2022 1839   KETONESUR NEGATIVE 11/28/2022 1839   PROTEINUR 100 (A) 11/28/2022 1839   NITRITE NEGATIVE 11/28/2022 1839   LEUKOCYTESUR NEGATIVE 11/28/2022 1839   Results for orders placed or performed during the hospital encounter of 11/28/22  Resp panel by RT-PCR (RSV, Flu A&B,  Covid) Anterior Nasal Swab     Status: Abnormal   Collection Time: 11/28/22  6:12 PM   Specimen: Anterior Nasal Swab  Result Value Ref Range Status   SARS Coronavirus 2 by RT PCR POSITIVE (A) NEGATIVE Final    Comment: (NOTE) SARS-CoV-2 target nucleic acids are DETECTED.  The SARS-CoV-2 RNA is generally detectable in upper respiratory specimens during the acute phase of infection. Positive results are indicative of the presence of the identified virus, but do not rule out bacterial infection or co-infection with other pathogens not detected by the test. Clinical correlation with patient history and other diagnostic information is necessary to determine patient infection status. The expected result is Negative.  Fact Sheet for Patients: BloggerCourse.com  Fact Sheet for Healthcare Providers: SeriousBroker.it  This test is not yet approved or cleared by the [place] FDA and  has been authorized for detection and/or diagnosis of SARS-CoV-2 by FDA under an Emergency Use Authorization (EUA).  This EUA will remain in effect (meaning this  test can be used) for the duration of  the COVID-19 declaration under Section 564(b)(1) of the A ct, 21 U.S.C. section 360bbb-3(b)(1), unless the authorization is terminated or revoked sooner.     Influenza A by PCR NEGATIVE NEGATIVE Final   Influenza B by PCR NEGATIVE NEGATIVE Final    Comment: (NOTE) The Xpert Xpress SARS-CoV-2/FLU/RSV plus assay is intended as an aid in the diagnosis of influenza from Nasopharyngeal swab specimens and should not be used as a sole basis for treatment. Nasal washings and aspirates are unacceptable for Xpert Xpress SARS-CoV-2/FLU/RSV testing.  Fact Sheet for Patients: BloggerCourse.com  Fact Sheet for Healthcare Providers: SeriousBroker.it  This test is not yet approved or cleared by the [place] FDA  and has been authorized for detection and/or diagnosis of SARS-CoV-2 by FDA under an Emergency Use Authorization (EUA). This EUA will remain in effect (meaning this test can be used) for the duration of the COVID-19 declaration under Section 564(b)(1) of the Act, 21 U.S.C. section 360bbb-3(b)(1), unless the authorization is terminated or revoked.     Resp Syncytial Virus by PCR NEGATIVE NEGATIVE Final    Comment: (NOTE) Fact Sheet for Patients: BloggerCourse.com  Fact Sheet for Healthcare Providers: SeriousBroker.it  This test is not yet approved or cleared by the [place] FDA and has been authorized for detection and/or diagnosis of SARS-CoV-2 by FDA under an Emergency Use Authorization (EUA). This EUA will remain in effect (meaning this test can be used) for the duration of the COVID-19 declaration under Section 564(b)(1) of the Act, 21 U.S.C. section 360bbb-3(b)(1), unless the authorization is terminated or revoked.  Performed at [facility], [address] [place], [place] [address]   MRSA Next Gen by PCR, Nasal     Status: None   Collection Time: 11/28/22 10:28 PM   Specimen: Nasal Mucosa; Nasal Swab  Result Value Ref Range Status   MRSA by PCR Next Gen NOT DETECTED NOT DETECTED Final    Comment: (NOTE) The GeneXpert MRSA Assay (FDA approved for NASAL specimens only), is one component of a comprehensive MRSA colonization surveillance program. It is not intended to diagnose MRSA infection nor to guide or monitor treatment for MRSA infections. Test performance is not FDA approved in patients less than 75 years old. Performed at [facility], [address], [place], [place] [address]     Radiologic Imaging: US RENAL  Result Date: 11/28/2022 CLINICAL DATA:  Acute renal failure. EXAM: RENAL / URINARY TRACT ULTRASOUND COMPLETE COMPARISON:  None Available. FINDINGS: Right Kidney: Renal  measurements: 9.5 cm x 4.8 cm x 6.3 cm = volume: 147.91 mL. Echogenicity within normal limits. No mass or hydronephrosis visualized. Left Kidney: Renal measurements: 10.5 cm x 6.8 cm x 5.8 cm = volume: 216.16 mL. Echogenicity within normal limits. No mass or hydronephrosis visualized. Bladder: A 5.0 cm x 5.8 cm x 5.7 cm heterogeneous hypoechoic mass is seen within the posterior aspect of the urinary bladder. Areas of flow are seen within this region on color Doppler evaluation. Other: Limited study secondary to patient motion and patient altered mental status. IMPRESSION: 1. Large, heterogeneous posterior urinary bladder soft tissue mass, concerning for the presence of an underlying neoplasm. MRI correlation is recommended. Electronically Signed   By: Aram Candela M.D.   On: 11/28/2022 21:46   DG Chest Portable 1 View  Result Date: 11/28/2022 CLINICAL DATA:  Altered mental status, productive cough EXAM: PORTABLE CHEST 1 VIEW COMPARISON:  08/03/2011 FINDINGS: Cardiac and mediastinal contours are  within normal limits. Aortic atherosclerosis. No focal pulmonary opacity. No pleural effusion or pneumothorax. No acute osseous abnormality. Status post interval left total shoulder arthroplasty. IMPRESSION: No acute cardiopulmonary process. Electronically Signed   By: Wiliam Ke M.D.   On: 11/28/2022 19:47   CT Head Wo Contrast  Result Date: 11/28/2022 CLINICAL DATA:  Altered mental status. EXAM: CT HEAD WITHOUT CONTRAST TECHNIQUE: Contiguous axial images were obtained from the base of the skull through the vertex without intravenous contrast. RADIATION DOSE REDUCTION: This exam was performed according to the departmental dose-optimization program which includes automated exposure control, adjustment of the mA and/or kV according to patient size and/or use of iterative reconstruction technique. COMPARISON:  Head CT dated 11/01/2022. FINDINGS: Brain: The ventricles and sulci are appropriate size for the  patient's age. The gray-Villatoro matter discrimination is preserved. There is no acute intracranial hemorrhage. No mass effect or midline shift. No extra-axial fluid collection. Vascular: No hyperdense vessel or unexpected calcification. Skull: Normal. Negative for fracture or focal lesion. Sinuses/Orbits: No acute finding. Other: None IMPRESSION: No acute intracranial pathology. Electronically Signed   By: Elgie Collard M.D.   On: 11/28/2022 19:37    Assessment & Plan:  72 year old female admitted with hypoglycemic coma, COVID with possible COVID encephalopathy, hypokalemia, and AKI with renal ultrasound findings of a large posterior bladder mass.  History is challenging given her recent psych history and apparent disorientation today.  With no hydronephrosis on renal ultrasound, I do not think her bladder mass is the source of her AKI, however would recommend trending her renal function and repeating a renal ultrasound in 48 hours if it is not improving, as the absence of hydronephrosis could be due to volume depletion at the time of admission.  She will require outpatient cystoscopy for further evaluation of her bladder tumor and anticipated surgical planning.  Low suspicion for UTI at this time.  Mild pyuria is a normal finding in the setting of a known bladder mass.  Recommendations: -Trend renal function, repeat renal ultrasound in 48 hours if not improving to reassess for hydronephrosis following resuscitation -Outpatient cystoscopy for further evaluation of her bladder mass  Thank you for involving me in this patient's care, I will continue to follow along.  Carman Ching, PA-C 11/29/2022 8:49 AM

## 2022-11-29 NOTE — Progress Notes (Signed)
Patient arrived from the ED via stretcher. Altered mental status, only oriented to person. Patient is able to follow commands and move all extremities. Often stares off and makes illogical statements, difficult to redirect. Intermittently lethargic. Bed bath completed and Purewick applied. Bladder scan show 31ml. Patient only has one PIV with poor vasculature. Bed alarm set and fall mats in place.   Family notes that patient is supposed to have an MRI this week, unsure of reason. Possible follow up for recent ED visit for confusion (? Dementia) or follow up for renal findings. The nieces present expressed concern about pt returning home alone. TOC order placed.

## 2022-11-29 NOTE — Progress Notes (Signed)
Progress Note    Kristie Hoover  IHK:[id number] DOB: 1950/05/04  DOA: 11/28/2022 PCP: [facility]      Brief Narrative:    Medical records reviewed and are as summarized below:  Kristie Hoover is a 72 y.o. female with medical history significant for DM, HTN, HLD, recently evaluated by neurologist Dr. Malvin Johns on 11/22/2022 with a new diagnosis of memory impairment and delusional disorder after being evaluated for for 1-month history of progressive memory loss, delusions about property being stolen and concern for medication compliance.  She was started on risperidone. She was also seen in the emergency department by psychiatrist in August 2024 for confusion, paranoia, hearing voices and poor memory. She presented to the emergency department after she was found unresponsive at home.  Family had been trying to get in touch with her without any response.  When EMS arrived, she was given Narcan without any response.  She was then found to have a blood glucose level of 23.  She was given D10 with improvement in her glucose by the time she arrived in the emergency department.   In the ED, she was found to have AKI with creatinine of 6.56, hypokalemia with potassium of 2.5, hypomagnesemia with magnesium of 1.0.  Glucose level dropped again from 107-23 in the emergency department.  She had recurrent hypoglycemic episodes in the ED.  She was started on 10% dextrose infusion for recurrent hypoglycemia and IV normal saline infusion for AKI.  Renal ultrasound also revealed a large bladder mass.        Assessment/Plan:   Principal Problem:   Uncontrolled diabetes mellitus with hypoglycemic coma, without long-term current use of insulin (HCC) Active Problems:   COVID-19 virus infection   AKI (acute kidney injury) (HCC)   High anion gap metabolic acidosis   Hypokalemia   Auditory hallucinations   Delusional disorder (HCC)   Hypocalcemia   HTN (hypertension)   Dementia without  behavioral disturbance (HCC)   Asthma   Hypercholesterolemia   Anemia    Body mass index is 21.34 kg/m.   Type II DM with recurrent hypoglycemia: Glucose levels have improved.  Discontinue 10% dextrose infusion and monitor glucose levels closely. She was on glipizide-metformin at home.  This has been held.   Acute kidney injury: Increase normal saline from 75 mL/h to 125 mL/h.  Place Foley catheter to monitor urine output.  Monitor BMP.  Follow-up with nephrologist for further recommendations.   Hypokalemia: Improving.  Continue potassium repletion. Hypomagnesemia: Improved but magnesium level is now above normal level.  Continue to monitor. Hypocalcemia: S/p treatment with IV calcium gluconate.  Monitor electrolytes.   Acute metabolic encephalopathy: Improving.  This is likely multifactorial from hypoglycemia, AKI and COVID-19 infection.   Large bladder mass: She has been evaluated by urologist.  Plan to repeat renal ultrasound if there is no significant improvement in renal function in 48 hours.  Outpatient follow-up cystoscopy was recommended.   COVID-19 infection: She is tolerating room air.  Supportive care as needed.   Auditory hallucinations, delusional disorder, dementia with behavioral disturbance: Continue supportive care.   Other comorbidities include hypertension, asthma, hypercholesterolemia, chronic anemia   Plan discussed with Kristie Hoover, niece, over the phone   Diet Order             Diet NPO time specified  Diet effective now  Consultants: Nephrologist Urologist  Procedures: None    Medications:    heparin injection (subcutaneous)  5,000 Units Subcutaneous Q8H   Continuous Infusions:  sodium chloride 75 mL/hr at 11/29/22 0700   dextrose 75 mL/hr at 11/29/22 0700   potassium chloride 10 mEq (11/29/22 0758)     Anti-infectives (From admission, onward)    None              Family  Communication/Anticipated D/C date and plan/Code Status   DVT prophylaxis: heparin injection 5,000 Units Start: 11/28/22 2200     Code Status: Full Code  Family Communication: Jeraldine, niece, over the phone Disposition Plan: Plan to discharge home when medically stable   Status is: Inpatient Remains inpatient appropriate because: Severe AKI       Subjective:   Interval events noted.  She is confused and cannot provide any history.  She said she lives at home with her husband given to her husband is deceased.  Kristie Sails, RN, was at the bedside  Objective:    Vitals:   11/29/22 0630 11/29/22 0645 11/29/22 0700 11/29/22 0800  BP: 123/63  138/72 (!) 143/77  Pulse: 74 78 76 86  Resp: (!) 9 16 15 12   Temp:    97.6 F (36.4 C)  TempSrc:      SpO2: 98% 97% 97% 98%  Weight:      Height:       No data found.   Intake/Output Summary (Last 24 hours) at 11/29/2022 0852 Last data filed at 11/29/2022 0700 Gross per 24 hour  Intake 2955.84 ml  Output 0 ml  Net 2955.84 ml   Filed Weights   11/28/22 1752 11/28/22 2130  Weight: 64 kg 61.8 kg    Exam:  GEN: NAD SKIN: Warm and dry EYES: No pallor or icterus ENT: MMM CV: RRR PULM: CTA B ABD: soft, ND, NT, +BS CNS: AAO x 1 (person), non focal EXT: No edema or tenderness      Data Reviewed:   I have personally reviewed following labs and imaging studies:  Labs: Labs show the following:   Basic Metabolic Panel: Recent Labs  Lab 11/28/22 1803 11/28/22 2120 11/28/22 2123 11/29/22 0020 11/29/22 0514  NA 141  --   --  142 139  K 2.5*  --   --  2.7* 3.2*  CL 99  --   --  101 100  CO2 22  --   --  23 21*  GLUCOSE 140*  --   --  112* 131*  BUN 40*  --   --  43* 41*  CREATININE 6.56*  --   --  6.95* 6.82*  CALCIUM 7.2*  --   --  6.9* 7.4*  MG  --  1.0*  --   --  3.9*  PHOS  --   --  5.4*  --  4.0   GFR Estimated Creatinine Clearance: 7 mL/min (A) (by C-G formula based on SCr of 6.82 mg/dL (H)). Liver  Function Tests: Recent Labs  Lab 11/28/22 1803  AST 21  ALT 11  ALKPHOS 87  BILITOT 1.1  PROT 6.8  ALBUMIN 2.3*   No results for input(s): "LIPASE", "AMYLASE" in the last 168 hours. Recent Labs  Lab 11/28/22 1812  AMMONIA 15   Coagulation profile No results for input(s): "INR", "PROTIME" in the last 168 hours.  CBC: Recent Labs  Lab 11/28/22 1803 11/29/22 0514  WBC 13.9* 14.7*  NEUTROABS 11.9*  --   HGB 11.5* 11.4*  HCT 34.2* 33.6*  MCV 84.7 84.4  PLT 279 311   Cardiac Enzymes: No results for input(s): "CKTOTAL", "CKMB", "CKMBINDEX", "TROPONINI" in the last 168 hours. BNP (last 3 results) No results for input(s): "PROBNP" in the last 8760 hours. CBG: Recent Labs  Lab 11/29/22 0512 11/29/22 0613 11/29/22 0656 11/29/22 0736 11/29/22 0834  GLUCAP 105* 86 115* 100* 94   D-Dimer: No results for input(s): "DDIMER" in the last 72 hours. Hgb A1c: No results for input(s): "HGBA1C" in the last 72 hours. Lipid Profile: No results for input(s): "CHOL", "HDL", "LDLCALC", "TRIG", "CHOLHDL", "LDLDIRECT" in the last 72 hours. Thyroid function studies: No results for input(s): "TSH", "T4TOTAL", "T3FREE", "THYROIDAB" in the last 72 hours.  Invalid input(s): "FREET3" Anemia work up: No results for input(s): "VITAMINB12", "FOLATE", "FERRITIN", "TIBC", "IRON", "RETICCTPCT" in the last 72 hours. Sepsis Labs: Recent Labs  Lab 11/28/22 1803 11/28/22 1915 11/29/22 0514  WBC 13.9*  --  14.7*  LATICACIDVEN  --  1.3  --     Microbiology Recent Results (from the past 240 hour(s))  Resp panel by RT-PCR (RSV, Flu A&B, Covid) Anterior Nasal Swab     Status: Abnormal   Collection Time: 11/28/22  6:12 PM   Specimen: Anterior Nasal Swab  Result Value Ref Range Status   SARS Coronavirus 2 by RT PCR POSITIVE (A) NEGATIVE Final    Comment: (NOTE) SARS-CoV-2 target nucleic acids are DETECTED.  The SARS-CoV-2 RNA is generally detectable in upper respiratory specimens during the  acute phase of infection. Positive results are indicative of the presence of the identified virus, but do not rule out bacterial infection or co-infection with other pathogens not detected by the test. Clinical correlation with patient history and other diagnostic information is necessary to determine patient infection status. The expected result is Negative.  Fact Sheet for Patients: BloggerCourse.com  Fact Sheet for Healthcare Providers: SeriousBroker.it  This test is not yet approved or cleared by the [place] FDA and  has been authorized for detection and/or diagnosis of SARS-CoV-2 by FDA under an Emergency Use Authorization (EUA).  This EUA will remain in effect (meaning this test can be used) for the duration of  the COVID-19 declaration under Section 564(b)(1) of the A ct, 21 U.S.C. section 360bbb-3(b)(1), unless the authorization is terminated or revoked sooner.     Influenza A by PCR NEGATIVE NEGATIVE Final   Influenza B by PCR NEGATIVE NEGATIVE Final    Comment: (NOTE) The Xpert Xpress SARS-CoV-2/FLU/RSV plus assay is intended as an aid in the diagnosis of influenza from Nasopharyngeal swab specimens and should not be used as a sole basis for treatment. Nasal washings and aspirates are unacceptable for Xpert Xpress SARS-CoV-2/FLU/RSV testing.  Fact Sheet for Patients: BloggerCourse.com  Fact Sheet for Healthcare Providers: SeriousBroker.it  This test is not yet approved or cleared by the [place] FDA and has been authorized for detection and/or diagnosis of SARS-CoV-2 by FDA under an Emergency Use Authorization (EUA). This EUA will remain in effect (meaning this test can be used) for the duration of the COVID-19 declaration under Section 564(b)(1) of the Act, 21 U.S.C. section 360bbb-3(b)(1), unless the authorization is terminated or revoked.     Resp  Syncytial Virus by PCR NEGATIVE NEGATIVE Final    Comment: (NOTE) Fact Sheet for Patients: BloggerCourse.com  Fact Sheet for Healthcare Providers: SeriousBroker.it  This test is not yet approved or cleared by the [place] FDA and has been authorized for detection and/or diagnosis of SARS-CoV-2 by  FDA under an Emergency Use Authorization (EUA). This EUA will remain in effect (meaning this test can be used) for the duration of the COVID-19 declaration under Section 564(b)(1) of the Act, 21 U.S.C. section 360bbb-3(b)(1), unless the authorization is terminated or revoked.  Performed at [facility], [address] [place], [place] [address]   MRSA Next Gen by PCR, Nasal     Status: None   Collection Time: Dec 09, 2022 10:28 PM   Specimen: Nasal Mucosa; Nasal Swab  Result Value Ref Range Status   MRSA by PCR Next Gen NOT DETECTED NOT DETECTED Final    Comment: (NOTE) The GeneXpert MRSA Assay (FDA approved for NASAL specimens only), is one component of a comprehensive MRSA colonization surveillance program. It is not intended to diagnose MRSA infection nor to guide or monitor treatment for MRSA infections. Test performance is not FDA approved in patients less than 89 years old. Performed at [facility], [address], [place], [place] [address]     Procedures and diagnostic studies:  US RENAL  Result Date: 12-09-2022 CLINICAL DATA:  Acute renal failure. EXAM: RENAL / URINARY TRACT ULTRASOUND COMPLETE COMPARISON:  None Available. FINDINGS: Right Kidney: Renal measurements: 9.5 cm x 4.8 cm x 6.3 cm = volume: 147.91 mL. Echogenicity within normal limits. No mass or hydronephrosis visualized. Left Kidney: Renal measurements: 10.5 cm x 6.8 cm x 5.8 cm = volume: 216.16 mL. Echogenicity within normal limits. No mass or hydronephrosis visualized. Bladder: A 5.0 cm x 5.8 cm x 5.7 cm heterogeneous hypoechoic mass is seen  within the posterior aspect of the urinary bladder. Areas of flow are seen within this region on color Doppler evaluation. Other: Limited study secondary to patient motion and patient altered mental status. IMPRESSION: 1. Large, heterogeneous posterior urinary bladder soft tissue mass, concerning for the presence of an underlying neoplasm. MRI correlation is recommended. Electronically Signed   By: Aram Candela M.D.   On: 12/09/22 21:46   DG Chest Portable 1 View  Result Date: 12-09-22 CLINICAL DATA:  Altered mental status, productive cough EXAM: PORTABLE CHEST 1 VIEW COMPARISON:  08/03/2011 FINDINGS: Cardiac and mediastinal contours are within normal limits. Aortic atherosclerosis. No focal pulmonary opacity. No pleural effusion or pneumothorax. No acute osseous abnormality. Status post interval left total shoulder arthroplasty. IMPRESSION: No acute cardiopulmonary process. Electronically Signed   By: Wiliam Ke M.D.   On: 12/09/22 19:47   CT Head Wo Contrast  Result Date: 2022/12/09 CLINICAL DATA:  Altered mental status. EXAM: CT HEAD WITHOUT CONTRAST TECHNIQUE: Contiguous axial images were obtained from the base of the skull through the vertex without intravenous contrast. RADIATION DOSE REDUCTION: This exam was performed according to the departmental dose-optimization program which includes automated exposure control, adjustment of the mA and/or kV according to patient size and/or use of iterative reconstruction technique. COMPARISON:  Head CT dated 11/01/2022. FINDINGS: Brain: The ventricles and sulci are appropriate size for the patient's age. The gray-Kassis matter discrimination is preserved. There is no acute intracranial hemorrhage. No mass effect or midline shift. No extra-axial fluid collection. Vascular: No hyperdense vessel or unexpected calcification. Skull: Normal. Negative for fracture or focal lesion. Sinuses/Orbits: No acute finding. Other: None IMPRESSION: No acute  intracranial pathology. Electronically Signed   By: Elgie Collard M.D.   On: 12-09-2022 19:37               LOS: 1 day   Emmanuela Ghazi  [facility]   Pager on [email or web address]. If 7PM-7AM, please  contact night-coverage at [email or web address]     11/29/2022, 8:52 AM

## 2022-11-29 NOTE — Progress Notes (Signed)
Initial Nutrition Assessment  DOCUMENTATION CODES:   Non-severe (moderate) malnutrition in context of social or environmental circumstances  INTERVENTION:   Ensure Enlive po TID, each supplement provides 350 kcal and 20 grams of protein.  Magic cup TID with meals, each supplement provides 290 kcal and 9 grams of protein  MVI po daily   Liberalize diet   Pt at high refeed risk; recommend monitor potassium, magnesium and phosphorus labs daily until stable  Daily weights  NUTRITION DIAGNOSIS:   Moderate Malnutrition related to social / environmental circumstances as evidenced by moderate fat depletion, moderate muscle depletion.  GOAL:   Patient will meet greater than or equal to 90% of their needs  MONITOR:   PO intake, Supplement acceptance, Labs, Weight trends, Skin, I & O's  REASON FOR ASSESSMENT:   Consult Assessment of nutrition requirement/status  ASSESSMENT:   72 y/o female with h/o DM, hypertension, hyperlipidemia, asthma, DDD, GERD and recent diagnosis of memory impairment and delusional disorder who is admitted with COVID 19, AKI and new bladder mass.  Met with pt in room today. Pt sitting up eating breakfast at the time of RD visit. Pt reports good appetite and oral intake at baseline. Of note, pt wears dentures. Pt reports that she does not drink supplements at home but reports that she is willing to drink chocolate Ensure in hospital. Per RN, pt drank 100% of an Ensure this morning. RD discussed with pt the importance of adequate nutrition needed to preserve lean muscle. RD will add supplements and MVI to help pt meet her estimated needs. Pt is at high refeed risk. Per chart, pt is down 23lbs(14%) since March; this is significant weight loss.   Medications reviewed and include: heparin, NaCl @75ml /hr, KCl  Labs reviewed: K 3.2(L), BUN 41(H), creat 6.82(H), P 4.0 wnl, Mg 3.9(H) Wbc- 14.7(H) Cbgs- 94, 105, 94, 100, 115, 86, 106, 115, 68, 101, 60 x 24 hrs    NUTRITION - FOCUSED PHYSICAL EXAM:  Flowsheet Row Most Recent Value  Orbital Region Mild depletion  Upper Arm Region Moderate depletion  Thoracic and Lumbar Region No depletion  Buccal Region No depletion  Temple Region Moderate depletion  Clavicle Bone Region Moderate depletion  Clavicle and Acromion Bone Region Moderate depletion  Scapular Bone Region Mild depletion  Dorsal Hand Mild depletion  Patellar Region Moderate depletion  Anterior Thigh Region Moderate depletion  Posterior Calf Region Moderate depletion  Edema (RD Assessment) None  Hair Reviewed  Eyes Reviewed  Mouth Reviewed  Skin Reviewed  Nails Reviewed   Diet Order:   Diet Order             Diet regular Room service appropriate? Yes; Fluid consistency: Thin  Diet effective now                  EDUCATION NEEDS:   Education needs have been addressed  Skin:  Skin Assessment: Reviewed RN Assessment (ecchymosis)  Last BM:  pta  Height:   Ht Readings from Last 1 Encounters:  11/28/22 5\' 7"  (1.702 m)    Weight:   Wt Readings from Last 1 Encounters:  11/28/22 61.8 kg    Ideal Body Weight:  61.36 kg  BMI:  Body mass index is 21.34 kg/m.  Estimated Nutritional Needs:   Kcal:  1600-1800kcal/day  Protein:  80-90g/day  Fluid:  1.6-1.8L/day  Betsey Holiday MS, RD, LDN Please refer to [facility] for RD and/or RD on-call/weekend/after hours pager

## 2022-11-29 NOTE — Progress Notes (Signed)
PHARMACY CONSULT NOTE - FOLLOW UP  Pharmacy Consult for Electrolyte Monitoring and Replacement   Recent Labs: Potassium (mmol/L)  Date Value  11/29/2022 2.7 (LL)   Magnesium (mg/dL)  Date Value  16/12/9602 1.0 (L)   Calcium (mg/dL)  Date Value  54/11/8117 6.9 (L)   Albumin (g/dL)  Date Value  14/78/2956 2.3 (L)   Phosphorus (mg/dL)  Date Value  21/30/8657 5.4 (H)   Sodium (mmol/L)  Date Value  11/29/2022 142     Assessment: 9/11:  Mag @ 2120 = 1.0    Goal of Therapy:  Electrolytes WNL   Plan:  Will order Mag Sulfate 4 gm IV X 2 doses Will recheck electrolytes on 9/12 with AM labs.   Scherrie Gerlach ,PharmD Clinical Pharmacist 11/29/2022 12:49 AM

## 2022-11-29 NOTE — Progress Notes (Signed)
PHARMACY CONSULT NOTE - FOLLOW UP  Pharmacy Consult for Electrolyte Monitoring and Replacement   Recent Labs: Potassium (mmol/L)  Date Value  11/29/2022 3.2 (L)   Magnesium (mg/dL)  Date Value  08/65/7846 3.9 (H)   Calcium (mg/dL)  Date Value  96/29/5284 7.4 (L)   Albumin (g/dL)  Date Value  13/24/4010 2.3 (L)   Phosphorus (mg/dL)  Date Value  27/25/3664 4.0   Sodium (mmol/L)  Date Value  11/29/2022 139     Assessment: 9/12 @ 0514:  K = 3.2                        Ca = 7.4, Alb = 2.3,  Corrected Ca = 8.8   Goal of Therapy:  Electrolytes WNL   Plan:  Will order KCl 10 mEq IV X 3 to start on 9/12 @ 0700 and recheck electrolytes on 9/12 @ 1600.  Scherrie Gerlach ,PharmD Clinical Pharmacist 11/29/2022 6:36 AM

## 2022-11-29 NOTE — Progress Notes (Addendum)
PHARMACY CONSULT NOTE - FOLLOW UP  Pharmacy Consult for Electrolyte Monitoring and Replacement   Recent Labs: Potassium (mmol/L)  Date Value  11/29/2022 2.7 (LL)   Magnesium (mg/dL)  Date Value  78/29/5621 1.0 (L)   Calcium (mg/dL)  Date Value  30/86/5784 6.9 (L)   Albumin (g/dL)  Date Value  69/62/9528 2.3 (L)   Phosphorus (mg/dL)  Date Value  41/32/4401 5.4 (H)   Sodium (mmol/L)  Date Value  11/29/2022 142     Assessment: 9/12 @ 0200:  K = 2.7                        Ca = 6.9,  Alb = 2.3,  Corrected Ca = 8.3            Goal of Therapy:  Electrolytes WNL   Plan:  Will order KCl 10 mEq IV X 4 Will order Calcium gluconate 1 gm IVPB X 1   Kristie Hoover ,PharmD Clinical Pharmacist 11/29/2022 12:49 AM

## 2022-11-29 NOTE — Consult Note (Signed)
[facility] CONSULT NOTE    Date: 11/29/2022                  Patient Name:  Kristie Hoover  MRN: [id number]  DOB: 1950/10/04  Age / Sex: 72 y.o., female         PCP: [facility]                 Service Requesting Consult: Hospitalist                 Reason for Consult: Acute kidney injury            History of Present Illness: Patient is a 72 y.o. female with a PMHx of diabetes mellitus type 2, GERD, hiatal hernia, hypercholesterolemia, hypertension, degenerative disc disease, asthma, history of memory impairment and delusional disorder, who was admitted to [facility] on 11/28/2022 for evaluation of significant hypoglycemia, COVID-19 infection, acute kidney injury.  Patient is a poor historian and cannot offer significant detail regarding her current illness.  She is apparently had progressive memory loss over the past several months.  She was found unresponsive at home.  Her blood sugar was quite low upon arrival at 23 and she was administered D10.  We are asked to see her for evaluation management of acute kidney injury.  Upon presentation her creatinine was 6.56.  Earlier in August on 10/27/2022 creatinine was 0.89.  Renal ultrasound was performed and was negative for hydronephrosis however there was a large posterior bladder mass noted.   Medications: Outpatient medications: Medications Prior to Admission  Medication Sig Dispense Refill Last Dose   acetaminophen (TYLENOL) 500 MG tablet Take 1,000 mg by mouth every 6 (six) hours as needed for moderate pain or headache. (Patient not taking: Reported on 06/05/2022)      aspirin EC 81 MG tablet Take 81 mg by mouth daily. Swallow whole.      Biotin 1000 MCG CHEW Chew 2,000 mcg by mouth daily.      cyclobenzaprine (FLEXERIL) 10 MG tablet Take 10 mg by mouth every morning. AND PRN      etodolac (LODINE) 400 MG tablet Take 400 mg by mouth 2 (two) times daily.      Fluticasone-Salmeterol (ADVAIR) 100-50 MCG/DOSE AEPB  Inhale 1 puff into the lungs in the morning and at bedtime. Inhale 1 puff twice daily may take a third puff at bedtime as needed for shortness of breath      furosemide (LASIX) 40 MG tablet Take 40 mg by mouth daily.      glipiZIDE-metformin (METAGLIP) 2.5-500 MG tablet Take 1 tablet by mouth daily before breakfast.      latanoprost (XALATAN) 0.005 % ophthalmic solution Place 1 drop into both eyes at bedtime.      montelukast (SINGULAIR) 10 MG tablet Take 10 mg by mouth every morning.      Multiple Vitamin (MULTIVITAMIN WITH MINERALS) TABS tablet Take 1 tablet by mouth daily.      nepafenac (ILEVRO) 0.3 % ophthalmic suspension       Netarsudil Dimesylate (RHOPRESSA) 0.02 % SOLN       pantoprazole (PROTONIX) 40 MG tablet Take 40 mg by mouth every morning.      potassium chloride SA (K-DUR,KLOR-CON) 20 MEQ tablet Take 20 mEq by mouth 2 (two) times daily.      pravastatin (PRAVACHOL) 40 MG tablet Take 40 mg by mouth every morning.       Current medications: Current Facility-Administered Medications  Medication  Dose Route Frequency Provider Last Rate Last Admin   0.9 %  sodium chloride infusion   Intravenous Continuous Lurene Shadow, MD 125 mL/hr at 11/29/22 1255 Rate Change at 11/29/22 1255   acetaminophen (TYLENOL) tablet 650 mg  650 mg Oral Q6H PRN Andris Baumann, MD       Or   acetaminophen (TYLENOL) suppository 650 mg  650 mg Rectal Q6H PRN Andris Baumann, MD       dextrose 50 % solution 50 mL  1 ampule Intravenous PRN Andris Baumann, MD   25 mL at 11/29/22 0407   feeding supplement (ENSURE ENLIVE / ENSURE PLUS) liquid 237 mL  237 mL Oral TID BM Lurene Shadow, MD       heparin injection 5,000 Units  5,000 Units Subcutaneous Q8H Sharen Hones, RPH   5,000 Units at 11/29/22 0522   HYDROcodone-acetaminophen (NORCO/VICODIN) 5-325 MG per tablet 1-2 tablet  1-2 tablet Oral Q4H PRN Andris Baumann, MD       [START ON 11/30/2022] multivitamin with minerals tablet 1 tablet  1 tablet Oral Daily  Lurene Shadow, MD       ondansetron [facility]) tablet 4 mg  4 mg Oral Q6H PRN Andris Baumann, MD       Or   ondansetron [facility]) injection 4 mg  4 mg Intravenous Q6H PRN Andris Baumann, MD       Oral care mouth rinse  15 mL Mouth Rinse PRN Andris Baumann, MD          Allergies: Allergies  Allergen Reactions   Oxycodone-Acetaminophen     Hallucinations    Prednisone Hives   Tramadol Other (See Comments)    Hallucinations   Chlorhexidine Gluconate Itching    Chg soap   Erythromycin Rash      Past Medical History: Past Medical History:  Diagnosis Date   Anemia    Arthritis    Asthma    WELL CONTROLLED   DDD (degenerative disc disease), lumbar    Diabetes mellitus without complication (HCC)    GERD (gastroesophageal reflux disease)    History of hiatal hernia    Hypercholesterolemia    Hypertension    NO MEDS   Kidney infection    Pneumonia    @ 77 months old   Wears dentures    full upper, partial lower     Past Surgical History: Past Surgical History:  Procedure Laterality Date   ABDOMINAL HYSTERECTOMY     APPENDECTOMY     CARPAL TUNNEL RELEASE Right    CATARACT EXTRACTION W/PHACO Right 08/15/2021   Procedure: CATARACT EXTRACTION PHACO AND INTRAOCULAR LENS PLACEMENT (IOC) RIGHT DIABETIC;  Surgeon: Galen Manila, MD;  Location: [facility];  Service: Ophthalmology;  Laterality: Right;  6.40 0:49.8   CATARACT EXTRACTION W/PHACO Left 08/29/2021   Procedure: CATARACT EXTRACTION PHACO AND INTRAOCULAR LENS PLACEMENT (IOC) LEFT DIABETIC 8.21 00:52.9;  Surgeon: Galen Manila, MD;  Location: [facility];  Service: Ophthalmology;  Laterality: Left;  Diabetic   COLONOSCOPY     COLONOSCOPY WITH PROPOFOL N/A 08/19/2017   Procedure: COLONOSCOPY WITH PROPOFOL;  Surgeon: Scot Jun, MD;  Location: [facility];  Service: Endoscopy;  Laterality: N/A;   CYSTOCELE REPAIR N/A 03/31/2020   Procedure: ANTERIOR REPAIR (CYSTOCELE);  Surgeon: Nadara Mustard, MD;  Location: [facility] ORS;  Service: Gynecology;  Laterality: N/A;   ESOPHAGOGASTRODUODENOSCOPY     ESOPHAGOGASTRODUODENOSCOPY (EGD) WITH PROPOFOL N/A 08/19/2017   Procedure: ESOPHAGOGASTRODUODENOSCOPY (EGD) WITH  PROPOFOL;  Surgeon: Scot Jun, MD;  Location: [facility];  Service: Endoscopy;  Laterality: N/A;   LAMINECTOMY  1998   NO METAL   RECTOCELE REPAIR N/A 03/31/2020   Procedure: POSTERIOR REPAIR (RECTOCELE);  Surgeon: Nadara Mustard, MD;  Location: [facility] ORS;  Service: Gynecology;  Laterality: N/A;   REVERSE SHOULDER ARTHROPLASTY Left 08/11/2020   Procedure: REVERSE SHOULDER ARTHROPLASTY;  Surgeon: Christena Flake, MD;  Location: [facility] ORS;  Service: Orthopedics;  Laterality: Left;   WRIST FUSION Right 1993   METAL PLATE AND SCREWS HAVE BEEN REMOVED     Family History: Family History  Problem Relation Age of Onset   Breast cancer Other      Social History: Social History   Socioeconomic History   Marital status: Single    Spouse name: Not on file   Number of children: Not on file   Years of education: Not on file   Highest education level: Not on file  Occupational History   Occupation: warehouse (socks) industry  Tobacco Use   Smoking status: Never   Smokeless tobacco: Never  Vaping Use   Vaping status: Never Used  Substance and Sexual Activity   Alcohol use: Never   Drug use: Never   Sexual activity: Not Currently  Other Topics Concern   Not on file  Social History Narrative   Patient lives alone in an apartment building.   Feels safe in her home.    Social Determinants of Health   Financial Resource Strain: Low Risk  (11/22/2022)   Received from [facility]   Overall Financial Resource Strain (CARDIA)    Difficulty of Paying Living Expenses: Not hard at all  Food Insecurity: Patient Unable To Answer (11/28/2022)   Hunger Vital Sign    Worried About Running Out of Food in the Last Year: Patient unable to answer    Ran Out  of Food in the Last Year: Patient unable to answer  Transportation Needs: Patient Unable To Answer (11/28/2022)   PRAPARE - Transportation    Lack of Transportation (Medical): Patient unable to answer    Lack of Transportation (Non-Medical): Patient unable to answer  Physical Activity: Not on file  Stress: Not on file  Social Connections: Not on file  Intimate Partner Violence: Patient Unable To Answer (11/28/2022)   Humiliation, Afraid, Rape, and Kick questionnaire    Fear of Current or Ex-Partner: Patient unable to answer    Emotionally Abused: Patient unable to answer    Physically Abused: Patient unable to answer    Sexually Abused: Patient unable to answer     Review of Systems: Patient unable to offer accurate review of systems due to altered mental status  Vital Signs: Blood pressure (!) 148/72, pulse 91, temperature 97.6 F (36.4 C), resp. rate 18, height 5\' 7"  (1.702 m), weight 61.8 kg, SpO2 100%.  Weight trends: Filed Weights   11/28/22 1752 11/28/22 2130  Weight: 64 kg 61.8 kg     Physical Exam: General: No acute distress  Head: Normocephalic, atraumatic. Moist oral mucosal membranes  Eyes: Anicteric  Neck: Supple  Lungs:  Clear to auscultation, normal effort  Heart: S1S2 no rubs  Abdomen:  Soft, nontender, bowel sounds present  Extremities: No peripheral edema.  Neurologic: Awake, alert, following commands  Skin: No acute rash  Access: No hemodialysis access    Lab results: Basic Metabolic Panel: Recent Labs  Lab 11/28/22 1803 11/28/22 2120 11/28/22 2123 11/29/22 0020 11/29/22 0514  NA 141  --   --  142 139  K 2.5*  --   --  2.7* 3.2*  CL 99  --   --  101 100  CO2 22  --   --  23 21*  GLUCOSE 140*  --   --  112* 131*  BUN 40*  --   --  43* 41*  CREATININE 6.56*  --   --  6.95* 6.82*  CALCIUM 7.2*  --   --  6.9* 7.4*  MG  --  1.0*  --   --  3.9*  PHOS  --   --  5.4*  --  4.0    Liver Function Tests: Recent Labs  Lab 11/28/22 1803  AST 21   ALT 11  ALKPHOS 87  BILITOT 1.1  PROT 6.8  ALBUMIN 2.3*   No results for input(s): "LIPASE", "AMYLASE" in the last 168 hours. Recent Labs  Lab 11/28/22 1812  AMMONIA 15    CBC: Recent Labs  Lab 11/28/22 1803 11/29/22 0514  WBC 13.9* 14.7*  NEUTROABS 11.9*  --   HGB 11.5* 11.4*  HCT 34.2* 33.6*  MCV 84.7 84.4  PLT 279 311    Cardiac Enzymes: No results for input(s): "CKTOTAL", "CKMB", "CKMBINDEX", "TROPONINI" in the last 168 hours.  BNP: Invalid input(s): "POCBNP"  CBG: Recent Labs  Lab 11/29/22 0834 11/29/22 0942 11/29/22 1040 11/29/22 1147 11/29/22 1241  GLUCAP 94 105* 94 87 115*    Microbiology: Results for orders placed or performed during the hospital encounter of 11/28/22  Resp panel by RT-PCR (RSV, Flu A&B, Covid) Anterior Nasal Swab     Status: Abnormal   Collection Time: 11/28/22  6:12 PM   Specimen: Anterior Nasal Swab  Result Value Ref Range Status   SARS Coronavirus 2 by RT PCR POSITIVE (A) NEGATIVE Final    Comment: (NOTE) SARS-CoV-2 target nucleic acids are DETECTED.  The SARS-CoV-2 RNA is generally detectable in upper respiratory specimens during the acute phase of infection. Positive results are indicative of the presence of the identified virus, but do not rule out bacterial infection or co-infection with other pathogens not detected by the test. Clinical correlation with patient history and other diagnostic information is necessary to determine patient infection status. The expected result is Negative.  Fact Sheet for Patients: BloggerCourse.com  Fact Sheet for Healthcare Providers: SeriousBroker.it  This test is not yet approved or cleared by the [place] FDA and  has been authorized for detection and/or diagnosis of SARS-CoV-2 by FDA under an Emergency Use Authorization (EUA).  This EUA will remain in effect (meaning this test can be used) for the duration of  the COVID-19  declaration under Section 564(b)(1) of the A ct, 21 U.S.C. section 360bbb-3(b)(1), unless the authorization is terminated or revoked sooner.     Influenza A by PCR NEGATIVE NEGATIVE Final   Influenza B by PCR NEGATIVE NEGATIVE Final    Comment: (NOTE) The Xpert Xpress SARS-CoV-2/FLU/RSV plus assay is intended as an aid in the diagnosis of influenza from Nasopharyngeal swab specimens and should not be used as a sole basis for treatment. Nasal washings and aspirates are unacceptable for Xpert Xpress SARS-CoV-2/FLU/RSV testing.  Fact Sheet for Patients: BloggerCourse.com  Fact Sheet for Healthcare Providers: SeriousBroker.it  This test is not yet approved or cleared by the [place] FDA and has been authorized for detection and/or diagnosis of SARS-CoV-2 by FDA under an Emergency Use Authorization (EUA). This EUA will remain in effect (meaning this test can be used) for the duration of  the COVID-19 declaration under Section 564(b)(1) of the Act, 21 U.S.C. section 360bbb-3(b)(1), unless the authorization is terminated or revoked.     Resp Syncytial Virus by PCR NEGATIVE NEGATIVE Final    Comment: (NOTE) Fact Sheet for Patients: BloggerCourse.com  Fact Sheet for Healthcare Providers: SeriousBroker.it  This test is not yet approved or cleared by the [place] FDA and has been authorized for detection and/or diagnosis of SARS-CoV-2 by FDA under an Emergency Use Authorization (EUA). This EUA will remain in effect (meaning this test can be used) for the duration of the COVID-19 declaration under Section 564(b)(1) of the Act, 21 U.S.C. section 360bbb-3(b)(1), unless the authorization is terminated or revoked.  Performed at [facility], [address] [place], [place] [address]   MRSA Next Gen by PCR, Nasal     Status: None   Collection Time: 11/28/22 10:28 PM    Specimen: Nasal Mucosa; Nasal Swab  Result Value Ref Range Status   MRSA by PCR Next Gen NOT DETECTED NOT DETECTED Final    Comment: (NOTE) The GeneXpert MRSA Assay (FDA approved for NASAL specimens only), is one component of a comprehensive MRSA colonization surveillance program. It is not intended to diagnose MRSA infection nor to guide or monitor treatment for MRSA infections. Test performance is not FDA approved in patients less than 25 years old. Performed at [facility], [address] [place], [place] [address]     Coagulation Studies: No results for input(s): "LABPROT", "INR" in the last 72 hours.  Urinalysis: Recent Labs    11/28/22 1839  COLORURINE AMBER*  LABSPEC 1.017  PHURINE 5.0  GLUCOSEU NEGATIVE  HGBUR NEGATIVE  BILIRUBINUR MODERATE*  KETONESUR NEGATIVE  PROTEINUR 100*  NITRITE NEGATIVE  LEUKOCYTESUR NEGATIVE      Imaging: US RENAL  Result Date: 11/28/2022 CLINICAL DATA:  Acute renal failure. EXAM: RENAL / URINARY TRACT ULTRASOUND COMPLETE COMPARISON:  None Available. FINDINGS: Right Kidney: Renal measurements: 9.5 cm x 4.8 cm x 6.3 cm = volume: 147.91 mL. Echogenicity within normal limits. No mass or hydronephrosis visualized. Left Kidney: Renal measurements: 10.5 cm x 6.8 cm x 5.8 cm = volume: 216.16 mL. Echogenicity within normal limits. No mass or hydronephrosis visualized. Bladder: A 5.0 cm x 5.8 cm x 5.7 cm heterogeneous hypoechoic mass is seen within the posterior aspect of the urinary bladder. Areas of flow are seen within this region on color Doppler evaluation. Other: Limited study secondary to patient motion and patient altered mental status. IMPRESSION: 1. Large, heterogeneous posterior urinary bladder soft tissue mass, concerning for the presence of an underlying neoplasm. MRI correlation is recommended. Electronically Signed   By: Aram Candela M.D.   On: 11/28/2022 21:46   DG Chest Portable 1 View  Result Date:  11/28/2022 CLINICAL DATA:  Altered mental status, productive cough EXAM: PORTABLE CHEST 1 VIEW COMPARISON:  08/03/2011 FINDINGS: Cardiac and mediastinal contours are within normal limits. Aortic atherosclerosis. No focal pulmonary opacity. No pleural effusion or pneumothorax. No acute osseous abnormality. Status post interval left total shoulder arthroplasty. IMPRESSION: No acute cardiopulmonary process. Electronically Signed   By: Wiliam Ke M.D.   On: 11/28/2022 19:47   CT Head Wo Contrast  Result Date: 11/28/2022 CLINICAL DATA:  Altered mental status. EXAM: CT HEAD WITHOUT CONTRAST TECHNIQUE: Contiguous axial images were obtained from the base of the skull through the vertex without intravenous contrast. RADIATION DOSE REDUCTION: This exam was performed according to the departmental dose-optimization program which includes automated exposure control, adjustment of  the mA and/or kV according to patient size and/or use of iterative reconstruction technique. COMPARISON:  Head CT dated 11/01/2022. FINDINGS: Brain: The ventricles and sulci are appropriate size for the patient's age. The gray-Ahart matter discrimination is preserved. There is no acute intracranial hemorrhage. No mass effect or midline shift. No extra-axial fluid collection. Vascular: No hyperdense vessel or unexpected calcification. Skull: Normal. Negative for fracture or focal lesion. Sinuses/Orbits: No acute finding. Other: None IMPRESSION: No acute intracranial pathology. Electronically Signed   By: Elgie Collard M.D.   On: 11/28/2022 19:37     Assessment & Plan: Pt is a 72 y.o. female with a PMHx of diabetes mellitus type 2, GERD, hiatal hernia, hypercholesterolemia, hypertension, degenerative disc disease, asthma, history of memory impairment and delusional disorder, who was admitted to [facility] on 11/28/2022 for evaluation of significant hypoglycemia, COVID-19 infection, acute kidney injury.  1.  Acute kidney injury most likely  secondary to acute tubular necrosis.  Baseline creatinine normal at 0.89 on 10/31/2022.  Renal ultrasound was performed and was negative for hydronephrosis however there was a large posterior bladder mass which was noted.  Creatinine was 6.56 upon admission and currently 6.82.  Maintain the patient on IV fluid hydration for now.  No immediate need for dialysis however this may need to be considered if oliguria persists and renal function worsens.  2.  Hypokalemia.  Serum potassium was 2.5 upon presentation.  Currently up to 3.2.  Maintain the patient on on repletion efforts.  3.  Posterior bladder mass.  Noted on renal ultrasound.  No obvious hydronephrosis noted on ultrasound.  Appreciate urology input.

## 2022-11-30 ENCOUNTER — Ambulatory Visit: Admission: RE | Admit: 2022-11-30 | Payer: 59 | Source: Ambulatory Visit

## 2022-11-30 DIAGNOSIS — N179 Acute kidney failure, unspecified: Secondary | ICD-10-CM | POA: Diagnosis not present

## 2022-11-30 DIAGNOSIS — E11641 Type 2 diabetes mellitus with hypoglycemia with coma: Secondary | ICD-10-CM | POA: Diagnosis not present

## 2022-11-30 LAB — HEMOGLOBIN A1C
Hgb A1c MFr Bld: 6 % — ABNORMAL HIGH (ref 4.8–5.6)
Mean Plasma Glucose: 126 mg/dL

## 2022-11-30 LAB — BASIC METABOLIC PANEL
Anion gap: 14 (ref 5–15)
BUN: 47 mg/dL — ABNORMAL HIGH (ref 8–23)
CO2: 22 mmol/L (ref 22–32)
Calcium: 8.1 mg/dL — ABNORMAL LOW (ref 8.9–10.3)
Chloride: 101 mmol/L (ref 98–111)
Creatinine, Ser: 6.64 mg/dL — ABNORMAL HIGH (ref 0.44–1.00)
GFR, Estimated: 6 mL/min — ABNORMAL LOW (ref >60)
Glucose, Bld: 162 mg/dL — ABNORMAL HIGH (ref 70–99)
Potassium: 4.3 mmol/L (ref 3.5–5.1)
Sodium: 137 mmol/L (ref 135–145)

## 2022-11-30 LAB — RENAL FUNCTION PANEL
Albumin: 2.2 g/dL — ABNORMAL LOW (ref 3.5–5.0)
Anion gap: 15 (ref 5–15)
BUN: 46 mg/dL — ABNORMAL HIGH (ref 8–23)
CO2: 21 mmol/L — ABNORMAL LOW (ref 22–32)
Calcium: 8 mg/dL — ABNORMAL LOW (ref 8.9–10.3)
Chloride: 102 mmol/L (ref 98–111)
Creatinine, Ser: 6.15 mg/dL — ABNORMAL HIGH (ref 0.44–1.00)
GFR, Estimated: 7 mL/min — ABNORMAL LOW (ref >60)
Glucose, Bld: 157 mg/dL — ABNORMAL HIGH (ref 70–99)
Phosphorus: 4.2 mg/dL (ref 2.5–4.6)
Potassium: 4 mmol/L (ref 3.5–5.1)
Sodium: 138 mmol/L (ref 135–145)

## 2022-11-30 LAB — CBC WITH DIFFERENTIAL/PLATELET
Abs Immature Granulocytes: 0.07 10*3/uL (ref 0.00–0.07)
Basophils Absolute: 0 10*3/uL (ref 0.0–0.1)
Basophils Relative: 0 %
Eosinophils Absolute: 0.2 10*3/uL (ref 0.0–0.5)
Eosinophils Relative: 1 %
HCT: 35.2 % — ABNORMAL LOW (ref 36.0–46.0)
Hemoglobin: 11.9 g/dL — ABNORMAL LOW (ref 12.0–15.0)
Immature Granulocytes: 1 %
Lymphocytes Relative: 10 %
Lymphs Abs: 1.2 10*3/uL (ref 0.7–4.0)
MCH: 28.1 pg (ref 26.0–34.0)
MCHC: 33.8 g/dL (ref 30.0–36.0)
MCV: 83.2 fL (ref 80.0–100.0)
Monocytes Absolute: 1 10*3/uL (ref 0.1–1.0)
Monocytes Relative: 9 %
Neutro Abs: 9.5 10*3/uL — ABNORMAL HIGH (ref 1.7–7.7)
Neutrophils Relative %: 79 %
Platelets: 309 10*3/uL (ref 150–400)
RBC: 4.23 MIL/uL (ref 3.87–5.11)
RDW: 16 % — ABNORMAL HIGH (ref 11.5–15.5)
WBC: 11.9 10*3/uL — ABNORMAL HIGH (ref 4.0–10.5)
nRBC: 0 % (ref 0.0–0.2)

## 2022-11-30 LAB — GLUCOSE, CAPILLARY
Glucose-Capillary: 116 mg/dL — ABNORMAL HIGH (ref 70–99)
Glucose-Capillary: 117 mg/dL — ABNORMAL HIGH (ref 70–99)
Glucose-Capillary: 120 mg/dL — ABNORMAL HIGH (ref 70–99)
Glucose-Capillary: 129 mg/dL — ABNORMAL HIGH (ref 70–99)
Glucose-Capillary: 133 mg/dL — ABNORMAL HIGH (ref 70–99)
Glucose-Capillary: 134 mg/dL — ABNORMAL HIGH (ref 70–99)
Glucose-Capillary: 150 mg/dL — ABNORMAL HIGH (ref 70–99)

## 2022-11-30 LAB — CK: Total CK: 161 U/L (ref 38–234)

## 2022-11-30 LAB — URINE CULTURE: Culture: NO GROWTH

## 2022-11-30 LAB — MAGNESIUM: Magnesium: 3.1 mg/dL — ABNORMAL HIGH (ref 1.7–2.4)

## 2022-11-30 MED ORDER — HYDRALAZINE HCL 10 MG PO TABS
10.0000 mg | ORAL_TABLET | Freq: Three times a day (TID) | ORAL | Status: DC
  Administered 2022-11-30 – 2022-12-10 (×31): 10 mg via ORAL
  Filled 2022-11-30 (×32): qty 1

## 2022-11-30 MED ORDER — HYDRALAZINE HCL 50 MG PO TABS: 25.0000 mg | ORAL_TABLET | Freq: Three times a day (TID) | ORAL | Status: DC | PRN

## 2022-11-30 MED ORDER — AMLODIPINE BESYLATE 5 MG PO TABS
2.5000 mg | ORAL_TABLET | Freq: Every day | ORAL | Status: DC
  Administered 2022-11-30 – 2022-12-04 (×4): 2.5 mg via ORAL
  Filled 2022-11-30 (×5): qty 1

## 2022-11-30 NOTE — Progress Notes (Addendum)
Progress Note    Kristie Hoover  ZOX:[id number] DOB: 25-Jun-1950  DOA: 11/28/2022 PCP: [facility]      Brief Narrative:    Medical records reviewed and are as summarized below:  Kristie Hoover is a 72 y.o. female with medical history significant for DM, HTN, HLD, recently evaluated by neurologist Dr. Malvin Johns on 11/22/2022 with a new diagnosis of memory impairment and delusional disorder after being evaluated for for 28-month history of progressive memory loss, delusions about property being stolen and concern for medication compliance.  She was started on risperidone. She was also seen in the emergency department by psychiatrist in August 2024 for confusion, paranoia, hearing voices and poor memory. She presented to the emergency department after she was found unresponsive at home.  Family had been trying to get in touch with her without any response.  When EMS arrived, she was given Narcan without any response.  She was then found to have a blood glucose level of 23.  She was given D10 with improvement in her glucose by the time she arrived in the emergency department.   In the ED, she was found to have AKI with creatinine of 6.56, hypokalemia with potassium of 2.5, hypomagnesemia with magnesium of 1.0.  Glucose level dropped again from 107-23 in the emergency department.  She had recurrent hypoglycemic episodes in the ED.  She was started on 10% dextrose infusion for recurrent hypoglycemia and IV normal saline infusion for AKI.  Renal ultrasound also revealed a large bladder mass.        Assessment/Plan:   Principal Problem:   Uncontrolled diabetes mellitus with hypoglycemic coma, without long-term current use of insulin (HCC) Active Problems:   AKI (acute kidney injury) (HCC)   High anion gap metabolic acidosis   Hypokalemia   COVID-19 virus infection   Auditory hallucinations   Delusional disorder (HCC)   Hypocalcemia   HTN (hypertension)   Dementia without  behavioral disturbance (HCC)   Asthma   Hypercholesterolemia   Anemia   Malnutrition of moderate degree    Body mass index is 22.75 kg/m.   Type II DM with recurrent hypoglycemia: Glucose levels have stabilized.  off of IV 10% dextrose infusion.  Continue to monitor glucose levels. She was on glipizide-metformin at home.  This has been held.   Acute kidney injury: Creatinine is down from 6.95-6.15.  No appreciable improvement in creatinine.  Patient is oliguric with minimal urine output.  Continue IV fluids and monitor BMP.  Foley catheter in place to monitor urine output closely.  Follow-up with nephrology for further recommendations.   Hypokalemia and hypocalcemia: Improved after repletion Hypomagnesemia: Improved but magnesium level is now above normal level.  Continue to monitor.   Large bladder mass: She has been evaluated by urologist.  Plan to repeat renal ultrasound tomorrow. Outpatient follow-up cystoscopy was recommended.   Acute metabolic encephalopathy: Patient appears to be back to her baseline mental status but she has underlying dementia.   Auditory hallucinations, delusional disorder, dementia with behavioral disturbance: Continue supportive care.   COVID-19 infection: She is tolerating room air.  Supportive care as needed.   General Weakness: PT and OT evaluation   Other comorbidities include hypertension, asthma, hypercholesterolemia, chronic anemia   Transfer from stepdown unit to progressive cardiac unit   Diet Order             Diet regular Room service appropriate? Yes; Fluid consistency: Thin  Diet effective now  Consultants: Nephrologist Urologist  Procedures: None    Medications:    feeding supplement  237 mL Oral TID BM   heparin injection (subcutaneous)  5,000 Units Subcutaneous Q8H   multivitamin with minerals  1 tablet Oral Daily   Continuous Infusions:  sodium chloride 125 mL/hr at  11/30/22 0700     Anti-infectives (From admission, onward)    None              Family Communication/Anticipated D/C date and plan/Code Status   DVT prophylaxis: heparin injection 5,000 Units Start: 11/28/22 2200     Code Status: Full Code  Family Communication: None Disposition Plan: Plan to discharge home when medically stable   Status is: Inpatient Remains inpatient appropriate because: Severe AKI       Subjective:   Interval events noted.  She has no complaints.  She is confused and cannot provide an adequate history.  Objective:    Vitals:   11/30/22 0437 11/30/22 0500 11/30/22 0600 11/30/22 0700  BP:  (!) 143/77 (!) 156/81 (!) 141/94  Pulse:  88 96 88  Resp:  20 16 (!) 21  Temp:      TempSrc:      SpO2:  93% 96% 90%  Weight: 65.9 kg     Height:       No data found.   Intake/Output Summary (Last 24 hours) at 11/30/2022 0840 Last data filed at 11/30/2022 0700 Gross per 24 hour  Intake 2551.9 ml  Output 480 ml  Net 2071.9 ml   Filed Weights   11/28/22 1752 11/28/22 2130 11/30/22 0437  Weight: 64 kg 61.8 kg 65.9 kg    Exam:  GEN: NAD SKIN: Warm and dry EYES: No pallor or icterus ENT: MMM CV: RRR PULM: CTA B ABD: soft, ND, NT, +BS CNS: AAO x 1 (person), non focal EXT: No edema or tenderness    Data Reviewed:   I have personally reviewed following labs and imaging studies:  Labs: Labs show the following:   Basic Metabolic Panel: Recent Labs  Lab 11/28/22 1803 11/28/22 2120 11/28/22 2123 11/29/22 0020 11/29/22 0514 11/30/22 0040 11/30/22 0601  NA 141  --   --  142 139 137 138  K 2.5*  --   --  2.7* 3.2* 4.3 4.0  CL 99  --   --  101 100 101 102  CO2 22  --   --  23 21* 22 21*  GLUCOSE 140*  --   --  112* 131* 162* 157*  BUN 40*  --   --  43* 41* 47* 46*  CREATININE 6.56*  --   --  6.95* 6.82* 6.64* 6.15*  CALCIUM 7.2*  --   --  6.9* 7.4* 8.1* 8.0*  MG  --  1.0*  --   --  3.9*  --  3.1*  PHOS  --   --  5.4*  --   4.0  --  4.2   GFR Estimated Creatinine Clearance: 7.8 mL/min (A) (by C-G formula based on SCr of 6.15 mg/dL (H)). Liver Function Tests: Recent Labs  Lab 11/28/22 1803 11/30/22 0601  AST 21  --   ALT 11  --   ALKPHOS 87  --   BILITOT 1.1  --   PROT 6.8  --   ALBUMIN 2.3* 2.2*   No results for input(s): "LIPASE", "AMYLASE" in the last 168 hours. Recent Labs  Lab 11/28/22 1812  AMMONIA 15   Coagulation profile No results for input(s): "  INR", "PROTIME" in the last 168 hours.  CBC: Recent Labs  Lab 11/28/22 1803 11/29/22 0514 11/30/22 0601  WBC 13.9* 14.7* 11.9*  NEUTROABS 11.9*  --  9.5*  HGB 11.5* 11.4* 11.9*  HCT 34.2* 33.6* 35.2*  MCV 84.7 84.4 83.2  PLT 279 311 309   Cardiac Enzymes: Recent Labs  Lab 11/30/22 0040  CKTOTAL 161   BNP (last 3 results) No results for input(s): "PROBNP" in the last 8760 hours. CBG: Recent Labs  Lab 11/29/22 1709 11/29/22 2016 11/30/22 0005 11/30/22 0350 11/30/22 0742  GLUCAP 172* 153* 117* 134* 129*   D-Dimer: No results for input(s): "DDIMER" in the last 72 hours. Hgb A1c: Recent Labs    11/28/22 1803  HGBA1C 6.0*   Lipid Profile: No results for input(s): "CHOL", "HDL", "LDLCALC", "TRIG", "CHOLHDL", "LDLDIRECT" in the last 72 hours. Thyroid function studies: No results for input(s): "TSH", "T4TOTAL", "T3FREE", "THYROIDAB" in the last 72 hours.  Invalid input(s): "FREET3" Anemia work up: No results for input(s): "VITAMINB12", "FOLATE", "FERRITIN", "TIBC", "IRON", "RETICCTPCT" in the last 72 hours. Sepsis Labs: Recent Labs  Lab 11/28/22 1803 11/28/22 1915 11/29/22 0514 11/30/22 0601  WBC 13.9*  --  14.7* 11.9*  LATICACIDVEN  --  1.3  --   --     Microbiology Recent Results (from the past 240 hour(s))  Resp panel by RT-PCR (RSV, Flu A&B, Covid) Anterior Nasal Swab     Status: Abnormal   Collection Time: 11/28/22  6:12 PM   Specimen: Anterior Nasal Swab  Result Value Ref Range Status   SARS  Coronavirus 2 by RT PCR POSITIVE (A) NEGATIVE Final    Comment: (NOTE) SARS-CoV-2 target nucleic acids are DETECTED.  The SARS-CoV-2 RNA is generally detectable in upper respiratory specimens during the acute phase of infection. Positive results are indicative of the presence of the identified virus, but do not rule out bacterial infection or co-infection with other pathogens not detected by the test. Clinical correlation with patient history and other diagnostic information is necessary to determine patient infection status. The expected result is Negative.  Fact Sheet for Patients: BloggerCourse.com  Fact Sheet for Healthcare Providers: SeriousBroker.it  This test is not yet approved or cleared by the [place] FDA and  has been authorized for detection and/or diagnosis of SARS-CoV-2 by FDA under an Emergency Use Authorization (EUA).  This EUA will remain in effect (meaning this test can be used) for the duration of  the COVID-19 declaration under Section 564(b)(1) of the A ct, 21 U.S.C. section 360bbb-3(b)(1), unless the authorization is terminated or revoked sooner.     Influenza A by PCR NEGATIVE NEGATIVE Final   Influenza B by PCR NEGATIVE NEGATIVE Final    Comment: (NOTE) The Xpert Xpress SARS-CoV-2/FLU/RSV plus assay is intended as an aid in the diagnosis of influenza from Nasopharyngeal swab specimens and should not be used as a sole basis for treatment. Nasal washings and aspirates are unacceptable for Xpert Xpress SARS-CoV-2/FLU/RSV testing.  Fact Sheet for Patients: BloggerCourse.com  Fact Sheet for Healthcare Providers: SeriousBroker.it  This test is not yet approved or cleared by the [place] FDA and has been authorized for detection and/or diagnosis of SARS-CoV-2 by FDA under an Emergency Use Authorization (EUA). This EUA will remain in effect (meaning  this test can be used) for the duration of the COVID-19 declaration under Section 564(b)(1) of the Act, 21 U.S.C. section 360bbb-3(b)(1), unless the authorization is terminated or revoked.     Resp Syncytial Virus by  PCR NEGATIVE NEGATIVE Final    Comment: (NOTE) Fact Sheet for Patients: BloggerCourse.com  Fact Sheet for Healthcare Providers: SeriousBroker.it  This test is not yet approved or cleared by the [place] FDA and has been authorized for detection and/or diagnosis of SARS-CoV-2 by FDA under an Emergency Use Authorization (EUA). This EUA will remain in effect (meaning this test can be used) for the duration of the COVID-19 declaration under Section 564(b)(1) of the Act, 21 U.S.C. section 360bbb-3(b)(1), unless the authorization is terminated or revoked.  Performed at [facility], [address] [place], [place] [address]   MRSA Next Gen by PCR, Nasal     Status: None   Collection Time: 12/27/2022 10:28 PM   Specimen: Nasal Mucosa; Nasal Swab  Result Value Ref Range Status   MRSA by PCR Next Gen NOT DETECTED NOT DETECTED Final    Comment: (NOTE) The GeneXpert MRSA Assay (FDA approved for NASAL specimens only), is one component of a comprehensive MRSA colonization surveillance program. It is not intended to diagnose MRSA infection nor to guide or monitor treatment for MRSA infections. Test performance is not FDA approved in patients less than 58 years old. Performed at [facility], [address], [place], [place] [address]     Procedures and diagnostic studies:  US RENAL  Result Date: 27-Dec-2022 CLINICAL DATA:  Acute renal failure. EXAM: RENAL / URINARY TRACT ULTRASOUND COMPLETE COMPARISON:  None Available. FINDINGS: Right Kidney: Renal measurements: 9.5 cm x 4.8 cm x 6.3 cm = volume: 147.91 mL. Echogenicity within normal limits. No mass or hydronephrosis visualized. Left Kidney: Renal  measurements: 10.5 cm x 6.8 cm x 5.8 cm = volume: 216.16 mL. Echogenicity within normal limits. No mass or hydronephrosis visualized. Bladder: A 5.0 cm x 5.8 cm x 5.7 cm heterogeneous hypoechoic mass is seen within the posterior aspect of the urinary bladder. Areas of flow are seen within this region on color Doppler evaluation. Other: Limited study secondary to patient motion and patient altered mental status. IMPRESSION: 1. Large, heterogeneous posterior urinary bladder soft tissue mass, concerning for the presence of an underlying neoplasm. MRI correlation is recommended. Electronically Signed   By: Aram Candela M.D.   On: 27-Dec-2022 21:46   DG Chest Portable 1 View  Result Date: 2022/12/27 CLINICAL DATA:  Altered mental status, productive cough EXAM: PORTABLE CHEST 1 VIEW COMPARISON:  08/03/2011 FINDINGS: Cardiac and mediastinal contours are within normal limits. Aortic atherosclerosis. No focal pulmonary opacity. No pleural effusion or pneumothorax. No acute osseous abnormality. Status post interval left total shoulder arthroplasty. IMPRESSION: No acute cardiopulmonary process. Electronically Signed   By: Wiliam Ke M.D.   On: 12/27/22 19:47   CT Head Wo Contrast  Result Date: December 27, 2022 CLINICAL DATA:  Altered mental status. EXAM: CT HEAD WITHOUT CONTRAST TECHNIQUE: Contiguous axial images were obtained from the base of the skull through the vertex without intravenous contrast. RADIATION DOSE REDUCTION: This exam was performed according to the departmental dose-optimization program which includes automated exposure control, adjustment of the mA and/or kV according to patient size and/or use of iterative reconstruction technique. COMPARISON:  Head CT dated 11/01/2022. FINDINGS: Brain: The ventricles and sulci are appropriate size for the patient's age. The gray-Golebiewski matter discrimination is preserved. There is no acute intracranial hemorrhage. No mass effect or midline shift. No extra-axial  fluid collection. Vascular: No hyperdense vessel or unexpected calcification. Skull: Normal. Negative for fracture or focal lesion. Sinuses/Orbits: No acute finding. Other: None IMPRESSION: No acute intracranial pathology.  Electronically Signed   By: Elgie Collard M.D.   On: 11/28/2022 19:37               LOS: 2 days   Alic Hilburn  [facility]   Pager on [email or web address]. If 7PM-7AM, please contact night-coverage at [email or web address]     11/30/2022, 8:40 AM

## 2022-11-30 NOTE — Progress Notes (Signed)
Patient pulled out 2 IV's, this RN placed a new IV and wrapped IV with Kerlix. Patient removed IV for a third time. Provider made aware of patient IV fluids having to be stopped due to loss of IV access. Charge RN attempted another IV with no success. IV team consulted. Will resume IV fluids once access obtained.

## 2022-11-30 NOTE — Evaluation (Signed)
Occupational Therapy Evaluation Patient Details Name: Kristie Hoover MRN: [id number] DOB: [birth year] Today's Date: 11/30/2022   History of Present Illness is a 72 y.o. female with a PMHx of diabetes mellitus type 2, GERD, hiatal hernia, hypercholesterolemia, hypertension, degenerative disc disease, asthma, history of memory impairment and delusional disorder, who was admitted to [facility] on 11/28/2022 for evaluation of significant hypoglycemia, COVID-19 infection, acute kidney injury.   Clinical Impression   Kristie Hoover was seen for OT evaluation this date. Prior to hospital admission, pt was ambulatory. Pt lives alone. Pt currently requires MIN A bed mobility and don B socks seated EOB. MOD A + HHA for sit<>stand and side steps along EOB. Completed Short Blessed Test for cognitive screen, pt scored 23/28 indicating significant cognitive impairments and positive screen for dementia. Pt would benefit from skilled OT to address noted impairments and functional limitations (see below for any additional details). Upon hospital discharge, recommend OT follow up and supervision for cognitive deficits.    If plan is discharge home, recommend the following: A lot of help with walking and/or transfers;A lot of help with bathing/dressing/bathroom;Supervision due to cognitive status;Help with stairs or ramp for entrance    Functional Status Assessment  Patient has had a recent decline in their functional status and demonstrates the ability to make significant improvements in function in a reasonable and predictable amount of time.  Equipment Recommendations  BSC/3in1    Recommendations for Other Services       Precautions / Restrictions Precautions Precautions: Fall Restrictions Weight Bearing Restrictions: No      Mobility Bed Mobility Overal bed mobility: Needs Assistance Bed Mobility: Supine to Sit, Sit to Supine     Supine to sit: Min assist Sit to supine: Min assist         Transfers Overall transfer level: Needs assistance Equipment used: 1 person hand held assist Transfers: Sit to/from Stand Sit to Stand: Mod assist                  Balance Overall balance assessment: Needs assistance Sitting-balance support: No upper extremity supported, Feet supported Sitting balance-Leahy Scale: Fair     Standing balance support: Single extremity supported Standing balance-Leahy Scale: Poor                             ADL either performed or assessed with clinical judgement   ADL Overall ADL's : Needs assistance/impaired                                       General ADL Comments: MIN A don B socks seated EOB. MOD A + HHA for simulated BSC t/f      Pertinent Vitals/Pain Pain Assessment Pain Assessment: No/denies pain     Extremity/Trunk Assessment Upper Extremity Assessment Upper Extremity Assessment: Generalized weakness   Lower Extremity Assessment Lower Extremity Assessment: Generalized weakness       Communication Communication Communication: Difficulty following commands/understanding Following commands: Follows one step commands with increased time   Cognition Arousal: Alert Behavior During Therapy: Restless Overall Cognitive Status: Impaired/Different from baseline Area of Impairment: Orientation, Memory, Following commands, Problem solving                 Orientation Level: Disoriented to, Place, Time   Memory: Decreased recall of precautions, Decreased short-term memory Following Commands: Follows one step commands consistently  Problem Solving: Slow processing, Requires verbal cues General Comments: 23/28 on Short Blessed cognitive screen                Home Living Family/patient expects to be discharged to:: Private residence Living Arrangements: Alone Available Help at Discharge: Family;Available PRN/intermittently Type of Home: Apartment Home Access: Level entry      Home Layout: One level                          Prior Functioning/Environment Prior Level of Function : Independent/Modified Independent                        OT Problem List: Decreased strength;Decreased range of motion;Decreased activity tolerance;Decreased cognition;Decreased safety awareness      OT Treatment/Interventions: Self-care/ADL training;Therapeutic exercise;Energy conservation;DME and/or AE instruction;Therapeutic activities    OT Goals(Current goals can be found in the care plan section) Acute Rehab OT Goals Patient Stated Goal: to return to PLOF OT Goal Formulation: With patient Time For Goal Achievement: 12/14/22 Potential to Achieve Goals: Good ADL Goals Pt Will Perform Grooming: with modified independence;standing Pt Will Perform Lower Body Dressing: with modified independence;sit to/from stand Pt Will Transfer to Toilet: with modified independence;ambulating;regular height toilet  OT Frequency: Min 1X/week    Co-evaluation              AM-PAC OT "6 Clicks" Daily Activity     Outcome Measure Help from another person eating meals?: None Help from another person taking care of personal grooming?: A Little Help from another person toileting, which includes using toliet, bedpan, or urinal?: A Lot Help from another person bathing (including washing, rinsing, drying)?: A Lot Help from another person to put on and taking off regular upper body clothing?: A Little Help from another person to put on and taking off regular lower body clothing?: A Lot 6 Click Score: 16   End of Session    Activity Tolerance: Patient tolerated treatment well Patient left: in bed;with call bell/phone within reach;with bed alarm set  OT Visit Diagnosis: Other abnormalities of gait and mobility (R26.89)                Time: 1610-9604 OT Time Calculation (min): 16 min Charges:  OT General Charges $OT Visit: 1 Visit OT Evaluation $OT Eval Low Complexity: 1  Low  Kristie Hoover, M.S. OTR/L  11/30/22, 12:31 PM  ascom [phone]

## 2022-11-30 NOTE — Progress Notes (Signed)
[facility]  ROUNDING NOTE   Subjective:  Creatinine improved slightly down to 6.1. Urine output was better over the preceding 24 hours at 480 cc but overall still low.   Objective:  Vital signs in last 24 hours:  Temp:  [98.2 F (36.8 C)-98.9 F (37.2 C)] 98.3 F (36.8 C) (09/13 0400) Pulse Rate:  [85-102] 87 (09/13 1000) Resp:  [6-24] 12 (09/13 1100) BP: (138-180)/(67-124) 154/82 (09/13 1100) SpO2:  [90 %-100 %] 96 % (09/13 1000) Weight:  [65.9 kg] 65.9 kg (09/13 0437)  Weight change: 1.9 kg Filed Weights   11/28/22 1752 11/28/22 2130 11/30/22 0437  Weight: 64 kg 61.8 kg 65.9 kg    Intake/Output: I/O last 3 completed shifts: In: 5507.7 [I.V.:3703.1; IV Piggyback:1804.6] Out: 480 [Urine:480]   Intake/Output this shift:  Total I/O In: 120 [P.O.:120] Out: -   Physical Exam: General: No acute distress  Head: Normocephalic, atraumatic. Moist oral mucosal membranes  Neck: Supple  Lungs:  Normal effort  Heart: S1S2 no rubs  Abdomen:  Nondistended  Extremities: No peripheral edema.  Neurologic: Awake, alert, following commands  Skin: No acute rash  Access: No hemodialysis access    Basic Metabolic Panel: Recent Labs  Lab 11/28/22 1803 11/28/22 2120 11/28/22 2123 11/29/22 0020 11/29/22 0514 11/30/22 0040 11/30/22 0601  NA 141  --   --  142 139 137 138  K 2.5*  --   --  2.7* 3.2* 4.3 4.0  CL 99  --   --  101 100 101 102  CO2 22  --   --  23 21* 22 21*  GLUCOSE 140*  --   --  112* 131* 162* 157*  BUN 40*  --   --  43* 41* 47* 46*  CREATININE 6.56*  --   --  6.95* 6.82* 6.64* 6.15*  CALCIUM 7.2*  --   --  6.9* 7.4* 8.1* 8.0*  MG  --  1.0*  --   --  3.9*  --  3.1*  PHOS  --   --  5.4*  --  4.0  --  4.2    Liver Function Tests: Recent Labs  Lab 11/28/22 1803 11/30/22 0601  AST 21  --   ALT 11  --   ALKPHOS 87  --   BILITOT 1.1  --   PROT 6.8  --   ALBUMIN 2.3* 2.2*   No results for input(s): "LIPASE", "AMYLASE" in the last 168  hours. Recent Labs  Lab 11/28/22 1812  AMMONIA 15    CBC: Recent Labs  Lab 11/28/22 1803 11/29/22 0514 11/30/22 0601  WBC 13.9* 14.7* 11.9*  NEUTROABS 11.9*  --  9.5*  HGB 11.5* 11.4* 11.9*  HCT 34.2* 33.6* 35.2*  MCV 84.7 84.4 83.2  PLT 279 311 309    Cardiac Enzymes: Recent Labs  Lab 11/30/22 0040  CKTOTAL 161    BNP: Invalid input(s): "POCBNP"  CBG: Recent Labs  Lab 11/29/22 2016 11/30/22 0005 11/30/22 0350 11/30/22 0742 11/30/22 1126  GLUCAP 153* 117* 134* 129* 133*    Microbiology: Results for orders placed or performed during the hospital encounter of 11/28/22  Resp panel by RT-PCR (RSV, Flu A&B, Covid) Anterior Nasal Swab     Status: Abnormal   Collection Time: 11/28/22  6:12 PM   Specimen: Anterior Nasal Swab  Result Value Ref Range Status   SARS Coronavirus 2 by RT PCR POSITIVE (A) NEGATIVE Final    Comment: (NOTE) SARS-CoV-2 target nucleic acids are DETECTED.  The SARS-CoV-2 RNA is generally detectable in upper respiratory specimens during the acute phase of infection. Positive results are indicative of the presence of the identified virus, but do not rule out bacterial infection or co-infection with other pathogens not detected by the test. Clinical correlation with patient history and other diagnostic information is necessary to determine patient infection status. The expected result is Negative.  Fact Sheet for Patients: BloggerCourse.com  Fact Sheet for Healthcare Providers: SeriousBroker.it  This test is not yet approved or cleared by the [place] FDA and  has been authorized for detection and/or diagnosis of SARS-CoV-2 by FDA under an Emergency Use Authorization (EUA).  This EUA will remain in effect (meaning this test can be used) for the duration of  the COVID-19 declaration under Section 564(b)(1) of the A ct, 21 U.S.C. section 360bbb-3(b)(1), unless the authorization  is terminated or revoked sooner.     Influenza A by PCR NEGATIVE NEGATIVE Final   Influenza B by PCR NEGATIVE NEGATIVE Final    Comment: (NOTE) The Xpert Xpress SARS-CoV-2/FLU/RSV plus assay is intended as an aid in the diagnosis of influenza from Nasopharyngeal swab specimens and should not be used as a sole basis for treatment. Nasal washings and aspirates are unacceptable for Xpert Xpress SARS-CoV-2/FLU/RSV testing.  Fact Sheet for Patients: BloggerCourse.com  Fact Sheet for Healthcare Providers: SeriousBroker.it  This test is not yet approved or cleared by the [place] FDA and has been authorized for detection and/or diagnosis of SARS-CoV-2 by FDA under an Emergency Use Authorization (EUA). This EUA will remain in effect (meaning this test can be used) for the duration of the COVID-19 declaration under Section 564(b)(1) of the Act, 21 U.S.C. section 360bbb-3(b)(1), unless the authorization is terminated or revoked.     Resp Syncytial Virus by PCR NEGATIVE NEGATIVE Final    Comment: (NOTE) Fact Sheet for Patients: BloggerCourse.com  Fact Sheet for Healthcare Providers: SeriousBroker.it  This test is not yet approved or cleared by the [place] FDA and has been authorized for detection and/or diagnosis of SARS-CoV-2 by FDA under an Emergency Use Authorization (EUA). This EUA will remain in effect (meaning this test can be used) for the duration of the COVID-19 declaration under Section 564(b)(1) of the Act, 21 U.S.C. section 360bbb-3(b)(1), unless the authorization is terminated or revoked.  Performed at [facility], [address] [place], [place] [address]   Urine Culture     Status: None   Collection Time: 11/28/22  6:39 PM   Specimen: Urine, Random  Result Value Ref Range Status   Specimen Description   Final    URINE, RANDOM Performed at  [facility], [address] [place], [place] [address]    Special Requests   Final    NONE Reflexed from [id number] Performed at [facility], [address], [place], [place] [address]    Culture   Final    NO GROWTH Performed at [facility], [address] [place], [place] [address]    Report Status 11/30/2022 FINAL  Final  MRSA Next Gen by PCR, Nasal     Status: None   Collection Time: 11/28/22 10:28 PM   Specimen: Nasal Mucosa; Nasal Swab  Result Value Ref Range Status   MRSA by PCR Next Gen NOT DETECTED NOT DETECTED Final    Comment: (NOTE) The GeneXpert MRSA Assay (FDA approved for NASAL specimens only), is one component of a comprehensive MRSA colonization surveillance program. It is not intended to  diagnose MRSA infection nor to guide or monitor treatment for MRSA infections. Test performance is not FDA approved in patients less than 78 years old. Performed at [facility], [address] [place], [place] [address]     Coagulation Studies: No results for input(s): "LABPROT", "INR" in the last 72 hours.  Urinalysis: Recent Labs    11/28/22 1839  COLORURINE AMBER*  LABSPEC 1.017  PHURINE 5.0  GLUCOSEU NEGATIVE  HGBUR NEGATIVE  BILIRUBINUR MODERATE*  KETONESUR NEGATIVE  PROTEINUR 100*  NITRITE NEGATIVE  LEUKOCYTESUR NEGATIVE      Imaging: US RENAL  Result Date: 11/28/2022 CLINICAL DATA:  Acute renal failure. EXAM: RENAL / URINARY TRACT ULTRASOUND COMPLETE COMPARISON:  None Available. FINDINGS: Right Kidney: Renal measurements: 9.5 cm x 4.8 cm x 6.3 cm = volume: 147.91 mL. Echogenicity within normal limits. No mass or hydronephrosis visualized. Left Kidney: Renal measurements: 10.5 cm x 6.8 cm x 5.8 cm = volume: 216.16 mL. Echogenicity within normal limits. No mass or hydronephrosis visualized. Bladder: A 5.0 cm x 5.8 cm x 5.7 cm heterogeneous hypoechoic mass is seen within the posterior aspect of the urinary bladder. Areas of  flow are seen within this region on color Doppler evaluation. Other: Limited study secondary to patient motion and patient altered mental status. IMPRESSION: 1. Large, heterogeneous posterior urinary bladder soft tissue mass, concerning for the presence of an underlying neoplasm. MRI correlation is recommended. Electronically Signed   By: Aram Candela M.D.   On: 11/28/2022 21:46   DG Chest Portable 1 View  Result Date: 11/28/2022 CLINICAL DATA:  Altered mental status, productive cough EXAM: PORTABLE CHEST 1 VIEW COMPARISON:  08/03/2011 FINDINGS: Cardiac and mediastinal contours are within normal limits. Aortic atherosclerosis. No focal pulmonary opacity. No pleural effusion or pneumothorax. No acute osseous abnormality. Status post interval left total shoulder arthroplasty. IMPRESSION: No acute cardiopulmonary process. Electronically Signed   By: Wiliam Ke M.D.   On: 11/28/2022 19:47   CT Head Wo Contrast  Result Date: 11/28/2022 CLINICAL DATA:  Altered mental status. EXAM: CT HEAD WITHOUT CONTRAST TECHNIQUE: Contiguous axial images were obtained from the base of the skull through the vertex without intravenous contrast. RADIATION DOSE REDUCTION: This exam was performed according to the departmental dose-optimization program which includes automated exposure control, adjustment of the mA and/or kV according to patient size and/or use of iterative reconstruction technique. COMPARISON:  Head CT dated 11/01/2022. FINDINGS: Brain: The ventricles and sulci are appropriate size for the patient's age. The gray-Min[place] matter discrimination is preserved. There is no acute intracranial hemorrhage. No mass effect or midline shift. No extra-axial fluid collection. Vascular: No hyperdense vessel or unexpected calcification. Skull: Normal. Negative for fracture or focal lesion. Sinuses/Orbits: No acute finding. Other: None IMPRESSION: No acute intracranial pathology. Electronically Signed   By: Elgie Collard  M.D.   On: 11/28/2022 19:37     Medications:    sodium chloride 125 mL/hr at 11/30/22 1218    feeding supplement  237 mL Oral TID BM   heparin injection (subcutaneous)  5,000 Units Subcutaneous Q8H   multivitamin with minerals  1 tablet Oral Daily   acetaminophen **OR** acetaminophen, dextrose, HYDROcodone-acetaminophen, ondansetron **OR** ondansetron (ZOFRAN) IV, mouth rinse  Assessment/ Plan:  72 y.o. female  with a PMHx of diabetes mellitus type 2, GERD, hiatal hernia, hypercholesterolemia, hypertension, degenerative disc disease, asthma, history of memory impairment and delusional disorder, who was admitted to [place] [facility] on 11/28/2022 for evaluation of significant hypoglycemia, COVID-19 infection, acute kidney injury.  1.  Acute kidney injury most likely secondary to acute tubular necrosis.  Baseline creatinine normal at 0.89 on 10/31/2022.  Renal ultrasound was performed and was negative for hydronephrosis however there was a large posterior bladder mass which was noted. -Renal function slightly improved with creatinine down to 6.1 and urine output was 480 cc which was better than the day before but still overall low.  No immediate need for dialysis however if oliguria persists may need to consider short-term renal placement therapy.  For now continue IV fluid hydration with 0.9 normal saline.  2.  Hypokalemia.  Serum potassium up to 4.0.  Continue to monitor serum electrolytes closely.  3.  Posterior bladder mass.  Noted on renal ultrasound.  Urology has seen the patient.  Outpatient follow-up for this mass recommended.   LOS: 2 Afshin Chrystal 9/13/20241:26 PM

## 2022-11-30 NOTE — Evaluation (Signed)
Physical Therapy Evaluation Patient Details Name: Kristie Hoover MRN: [id number] DOB: Aug 17, 1950 Today's Date: 11/30/2022  History of Present Illness  is a 72 y.o. female with a PMHx of diabetes mellitus type 2, GERD, hiatal hernia, hypercholesterolemia, hypertension, degenerative disc disease, asthma, history of memory impairment and delusional disorder, who was admitted to [facility] on 11/28/2022 for evaluation of significant hypoglycemia, COVID-19 infection, acute kidney injury.   Clinical Impression  Patient alert, agreeable to PT. Oriented to self and "hospital" and month of September but exhibited decreased situational awareness, safety awareness, and restlessness. Supine <> sit minA with extended time. Sit <> Stand twice with RW and modA. She was able to take several steps laterally but also forwards/backwards but needed modA throughout for general unsteadiness/fatigue.  Overall the patient demonstrated deficits (see "PT Problem List") that impede the patient's functional abilities, safety, and mobility and would benefit from skilled PT intervention.         If plan is discharge home, recommend the following: A lot of help with bathing/dressing/bathroom;A lot of help with walking and/or transfers;Assist for transportation;Assistance with cooking/housework;Help with stairs or ramp for entrance   Can travel by private vehicle   No    Equipment Recommendations Other (comment) (TBD at next venue of care)  Recommendations for Other Services       Functional Status Assessment Patient has had a recent decline in their functional status and demonstrates the ability to make significant improvements in function in a reasonable and predictable amount of time.     Precautions / Restrictions Precautions Precautions: Fall Restrictions Weight Bearing Restrictions: No      Mobility  Bed Mobility Overal bed mobility: Needs Assistance Bed Mobility: Supine to Sit, Sit to Supine     Supine to  sit: Min assist Sit to supine: Min assist        Transfers Overall transfer level: Needs assistance Equipment used: Rolling walker (2 wheels) Transfers: Sit to/from Stand Sit to Stand: Mod assist           General transfer comment: performed twice, modA each time with RW stabilization    Ambulation/Gait Ambulation/Gait assistance: Mod assist Gait Distance (Feet): 3 Feet Assistive device: Rolling walker (2 wheels)         General Gait Details: assistance with RW management and general unsteadiness  Stairs            Wheelchair Mobility     Tilt Bed    Modified Rankin (Stroke Patients Only)       Balance Overall balance assessment: Needs assistance Sitting-balance support: No upper extremity supported, Feet supported Sitting balance-Leahy Scale: Fair     Standing balance support: Bilateral upper extremity supported Standing balance-Leahy Scale: Poor                               Pertinent Vitals/Pain Pain Assessment Pain Assessment: No/denies pain    Home Living Family/patient expects to be discharged to:: Private residence Living Arrangements: Alone Available Help at Discharge: Family;Available PRN/intermittently Type of Home: Apartment Home Access: Level entry       Home Layout: One level Home Equipment: Shower seat Additional Comments: pt reported independence for tasks, that her neices check in on her but she doesn't want their assistance.    Prior Function Prior Level of Function : Independent/Modified Independent                     Extremity/Trunk  Assessment   Upper Extremity Assessment Upper Extremity Assessment: Generalized weakness    Lower Extremity Assessment Lower Extremity Assessment: Generalized weakness    Cervical / Trunk Assessment Cervical / Trunk Assessment: Normal  Communication   Communication Communication: Difficulty following commands/understanding Following commands: Follows one  step commands with increased time  Cognition Arousal: Alert Behavior During Therapy: Restless Overall Cognitive Status: Impaired/Different from baseline Area of Impairment: Orientation, Memory, Following commands, Problem solving                 Orientation Level: Disoriented to, Place, Time   Memory: Decreased recall of precautions, Decreased short-term memory Following Commands: Follows one step commands consistently     Problem Solving: Slow processing, Requires verbal cues General Comments: 23/28 on Short Blessed cognitive screen        General Comments      Exercises     Assessment/Plan    PT Assessment Patient needs continued PT services  PT Problem List Decreased strength;Decreased activity tolerance;Decreased balance;Decreased mobility;Decreased knowledge of use of DME;Decreased safety awareness       PT Treatment Interventions DME instruction;Neuromuscular re-education;Gait training;Patient/family education;Stair training;Functional mobility training;Therapeutic exercise;Balance training;Therapeutic activities    PT Goals (Current goals can be found in the Care Plan section)  Acute Rehab PT Goals Patient Stated Goal: to feel better PT Goal Formulation: With patient Time For Goal Achievement: 12/14/22 Potential to Achieve Goals: Good    Frequency Min 1X/week     Co-evaluation               AM-PAC PT "6 Clicks" Mobility  Outcome Measure Help needed turning from your back to your side while in a flat bed without using bedrails?: A Little Help needed moving from lying on your back to sitting on the side of a flat bed without using bedrails?: A Little Help needed moving to and from a bed to a chair (including a wheelchair)?: A Little Help needed standing up from a chair using your arms (e.g., wheelchair or bedside chair)?: A Little Help needed to walk in hospital room?: A Little Help needed climbing 3-5 steps with a railing? : A Little 6 Click  Score: 18    End of Session   Activity Tolerance: Patient tolerated treatment well Patient left: in bed;with call bell/phone within reach;with bed alarm set Nurse Communication: Mobility status PT Visit Diagnosis: Other abnormalities of gait and mobility (R26.89);Muscle weakness (generalized) (M62.81);Difficulty in walking, not elsewhere classified (R26.2)    Time: 4098-1191 PT Time Calculation (min) (ACUTE ONLY): 25 min   Charges:   PT Evaluation $PT Eval Low Complexity: 1 Low PT Treatments $Therapeutic Activity: 8-22 mins PT General Charges $$ ACUTE PT VISIT: 1 Visit        Olga Coaster PT, DPT 1:15 PM,11/30/22

## 2022-11-30 NOTE — Consult Note (Signed)
PHARMACY CONSULT NOTE - ELECTROLYTES  Pharmacy Consult for Electrolyte Monitoring and Replacement   Recent Labs: Potassium (mmol/L)  Date Value  11/30/2022 4.0   Magnesium (mg/dL)  Date Value  16/12/9602 3.1 (H)   Calcium (mg/dL)  Date Value  54/11/8117 8.0 (L)   Albumin (g/dL)  Date Value  14/78/2956 2.2 (L)   Phosphorus (mg/dL)  Date Value  21/30/8657 4.2   Sodium (mmol/L)  Date Value  11/30/2022 138   Height: 5\' 7"  (170.2 cm) Weight: 65.9 kg (145 lb 4.5 oz) IBW/kg (Calculated) : 61.6 Estimated Creatinine Clearance: 7.8 mL/min (A) (by C-G formula based on SCr of 6.15 mg/dL (H)).  Assessment  Kristie Hoover is a 72 y.o. female presenting with AKI and bladder mass. PMH significant for DM, HTN, HLD, memory impairment. Pharmacy has been consulted to monitor and replace electrolytes.  Diet: Regular MIVF: NS @ 125 mL/hr Pertinent medications: N/A  Goal of Therapy: Electrolytes within normal limits  Plan:  No electrolyte replacement indicated at this time Follow-up electrolytes as ordered by primary team and replacement when indicated  Thank you for allowing pharmacy to be a part of this patient's care.  [name] 11/30/2022 8:00 AM

## 2022-11-30 NOTE — Plan of Care (Signed)
  Problem: Education: Goal: Knowledge of General Education information will improve Description: Including pain rating scale, medication(s)/side effects and non-pharmacologic comfort measures Outcome: Progressing   Problem: Health Behavior/Discharge Planning: Goal: Ability to manage health-related needs will improve Outcome: Progressing   Problem: Clinical Measurements: Goal: Ability to maintain clinical measurements within normal limits will improve Outcome: Progressing Goal: Will remain free from infection Outcome: Progressing Goal: Diagnostic test results will improve Outcome: Progressing Goal: Respiratory complications will improve Outcome: Progressing Goal: Cardiovascular complication will be avoided Outcome: Progressing   Problem: Activity: Goal: Risk for activity intolerance will decrease Outcome: Progressing   Problem: Nutrition: Goal: Adequate nutrition will be maintained Outcome: Progressing   Problem: Coping: Goal: Level of anxiety will decrease Outcome: Progressing   Problem: Elimination: Goal: Will not experience complications related to bowel motility Outcome: Progressing Goal: Will not experience complications related to urinary retention Outcome: Progressing   Problem: Pain Managment: Goal: General experience of comfort will improve Outcome: Progressing   Problem: Safety: Goal: Ability to remain free from injury will improve Outcome: Progressing   Problem: Skin Integrity: Goal: Risk for impaired skin integrity will decrease Outcome: Progressing   Problem: Education: Goal: Knowledge of risk factors and measures for prevention of condition will improve Outcome: Progressing   Problem: Coping: Goal: Psychosocial and spiritual needs will be supported Outcome: Progressing   Problem: Respiratory: Goal: Will maintain a patent airway Outcome: Progressing Goal: Complications related to the disease process, condition or treatment will be avoided or  minimized Outcome: Progressing   Problem: Education: Goal: Knowledge of disease and its progression will improve Outcome: Progressing   Problem: Health Behavior/Discharge Planning: Goal: Ability to manage health-related needs will improve Outcome: Progressing   Problem: Clinical Measurements: Goal: Complications related to the disease process or treatment will be avoided or minimized Outcome: Progressing Goal: Dialysis access will remain free of complications Outcome: Progressing   Problem: Activity: Goal: Activity intolerance will improve Outcome: Progressing   Problem: Fluid Volume: Goal: Fluid volume balance will be maintained or improved Outcome: Progressing   Problem: Nutritional: Goal: Ability to make appropriate dietary choices will improve Outcome: Progressing   Problem: Respiratory: Goal: Respiratory symptoms related to disease process will be avoided Outcome: Progressing

## 2022-12-01 DIAGNOSIS — E11641 Type 2 diabetes mellitus with hypoglycemia with coma: Secondary | ICD-10-CM | POA: Diagnosis not present

## 2022-12-01 DIAGNOSIS — N179 Acute kidney failure, unspecified: Secondary | ICD-10-CM | POA: Diagnosis not present

## 2022-12-01 LAB — GLUCOSE, CAPILLARY
Glucose-Capillary: 89 mg/dL (ref 70–99)
Glucose-Capillary: 123 mg/dL — ABNORMAL HIGH (ref 70–99)
Glucose-Capillary: 142 mg/dL — ABNORMAL HIGH (ref 70–99)
Glucose-Capillary: 152 mg/dL — ABNORMAL HIGH (ref 70–99)

## 2022-12-01 LAB — RENAL FUNCTION PANEL
Albumin: 1.8 g/dL — ABNORMAL LOW (ref 3.5–5.0)
Anion gap: 11 (ref 5–15)
BUN: 53 mg/dL — ABNORMAL HIGH (ref 8–23)
CO2: 20 mmol/L — ABNORMAL LOW (ref 22–32)
Calcium: 8.4 mg/dL — ABNORMAL LOW (ref 8.9–10.3)
Chloride: 108 mmol/L (ref 98–111)
Creatinine, Ser: 4.2 mg/dL — ABNORMAL HIGH (ref 0.44–1.00)
GFR, Estimated: 11 mL/min — ABNORMAL LOW (ref >60)
Glucose, Bld: 150 mg/dL — ABNORMAL HIGH (ref 70–99)
Phosphorus: 4.3 mg/dL (ref 2.5–4.6)
Potassium: 3.8 mmol/L (ref 3.5–5.1)
Sodium: 139 mmol/L (ref 135–145)

## 2022-12-01 LAB — MAGNESIUM: Magnesium: 2.4 mg/dL (ref 1.7–2.4)

## 2022-12-01 MED ORDER — OLANZAPINE 10 MG IM SOLR
2.5000 mg | Freq: Once | INTRAMUSCULAR | Status: AC
  Administered 2022-12-01: 2.5 mg via INTRAMUSCULAR
  Filled 2022-12-01: qty 10

## 2022-12-01 NOTE — Plan of Care (Signed)
  Problem: Clinical Measurements: Goal: Ability to maintain clinical measurements within normal limits will improve Outcome: Progressing   Problem: Clinical Measurements: Goal: Respiratory complications will improve Outcome: Progressing   Problem: Pain Managment: Goal: General experience of comfort will improve Outcome: Progressing   Problem: Safety: Goal: Ability to remain free from injury will improve Outcome: Progressing   Problem: Fluid Volume: Goal: Fluid volume balance will be maintained or improved Outcome: Progressing

## 2022-12-01 NOTE — Progress Notes (Signed)
CROSS COVER NOTE  NAME: Kristie Hoover MRN: [id number] DOB : [birth year]    Concern as stated by nurse / staff     Patient has not slept at all. She keeps attempting to get out of bed and I have stayed in the room with her for 30 to 40 minutes at a time to keep her from actively attempting to get out of bed. She is covid positive. She has no meds for sleep or anxiety. Patient still has no IV access. We attempted again and were unsuccessful.  She keeps saying she is going home. Then she thinks she is at home. She has also told me several times she would slap me.   Pertinent findings on chart review: Admit with hypoglycemia and acute metabolic encephalopathy with underlying dementia and delusional disorder   Assessment and  Interventions   Assessment:  Plan: Zyprexa 2.5 mg IM x1       Donnie Mesa NP [facility] Cross Cover 7pm-7am - check amion for availability Pager [phone]

## 2022-12-01 NOTE — Consult Note (Deleted)
PHARMACY CONSULT NOTE - ELECTROLYTES  Pharmacy Consult for Electrolyte Monitoring and Replacement   Recent Labs: Potassium (mmol/L)  Date Value  12/01/2022 3.8   Magnesium (mg/dL)  Date Value  25/36/6440 2.4   Calcium (mg/dL)  Date Value  34/74/2595 8.4 (L)   Albumin (g/dL)  Date Value  63/87/5643 1.8 (L)   Phosphorus (mg/dL)  Date Value  32/95/1884 4.3   Sodium (mmol/L)  Date Value  12/01/2022 139   Height: 5\' 7"  (170.2 cm) Weight: 65 kg (143 lb 4.8 oz) IBW/kg (Calculated) : 61.6 Estimated Creatinine Clearance: 11.4 mL/min (A) (by C-G formula based on SCr of 4.2 mg/dL (H)).  Assessment  Kristie Hoover is a 72 y.o. female presenting with AKI and bladder mass. PMH significant for DM, HTN, HLD, memory impairment. Pharmacy has been consulted to monitor and replace electrolytes.  Diet: Regular MIVF: NS @ 125 mL/hr Pertinent medications: N/A  Goal of Therapy: Electrolytes within normal limits  Plan:  Scr improving 6.15>>4.20 No electrolyte replacement indicated at this time Follow-up electrolytes as ordered by primary team and replacement when indicated  Thank you for allowing pharmacy to be a part of this patient's care.  Daison Braxton A 12/01/2022 11:21 AM

## 2022-12-01 NOTE — Consult Note (Signed)
PHARMACY CONSULT NOTE - ELECTROLYTES  Pharmacy Consult for Electrolyte Monitoring and Replacement   Recent Labs: Potassium (mmol/L)  Date Value  12/01/2022 3.8   Magnesium (mg/dL)  Date Value  16/12/9602 2.4   Calcium (mg/dL)  Date Value  54/11/8117 8.4 (L)   Albumin (g/dL)  Date Value  14/78/2956 1.8 (L)   Phosphorus (mg/dL)  Date Value  21/30/8657 4.3   Sodium (mmol/L)  Date Value  12/01/2022 139   Height: 5\' 7"  (170.2 cm) Weight: 65 kg (143 lb 4.8 oz) IBW/kg (Calculated) : 61.6 Estimated Creatinine Clearance: 11.4 mL/min (A) (by C-G formula based on SCr of 4.2 mg/dL (H)).  Assessment  Kristie Hoover is a 72 y.o. female presenting with AKI and bladder mass. PMH significant for DM, HTN, HLD, memory impairment. Pharmacy has been consulted to monitor and replace electrolytes.  Diet: Regular MIVF: NS @ 125 mL/hr Pertinent medications: N/A  Goal of Therapy: Electrolytes within normal limits  Plan:  No electrolyte replacement indicated at this time Follow-up electrolytes as ordered by primary team and replacement when indicated  Thank you for allowing pharmacy to be a part of this patient's care.  Lowella Bandy 12/01/2022 11:21 AM

## 2022-12-01 NOTE — TOC Initial Note (Signed)
Transition of Care [facility]) - Initial/Assessment Note    Patient Details  Name: Kristie Hoover MRN: [id number] Date of Birth: [birth year]  [facility] CM/SW Contact:    Allena Katz, LCSW Phone Number: 12/01/2022, 2:20 PM  Clinical Narrative:  CSW spoke with niece who reports that pt is very confused at baseline. She states pt lives alone and that often does hallucinate at baseline. She states pt is fully ambulatory and independent. Niece reports that she helps her at home but that they all state that they work and cannot assist much with her at this level. Pt niece reports that she would like referrals sent out for rehab and believes it would be best to look for LTC for patient. CS[place] work[place].                 Expected Discharge Plan: Skilled Nursing Facility Barriers to Discharge: Continued Medical Work up   Patient Goals and CMS Choice Patient states their goals for this hospitalization and ongoing recovery are:: go to rehab CMS Medicare.gov Compare Post Acute Care list provided to:: Patient Represenative (must comment) (patients niece) Choice offered to / list presented to : [facility] POA / Guardian      Expected Discharge Plan and Services                                              Prior Living Arrangements/Services   Lives with:: Self Patient language and need for interpreter reviewed:: Yes                 Activities of Daily Living Home Assistive Devices/Equipment: None (per patient, not a reliable historian) ADL Screening (condition at time of admission) Patient's cognitive ability adequate to safely complete daily activities?: No Is the patient deaf or have difficulty hearing?: No Does the patient have difficulty seeing, even when wearing glasses/contacts?: No Does the patient have difficulty concentrating, remembering, or making decisions?: Yes Patient able to express need for assistance with ADLs?: Yes Does the  patient have difficulty dressing or bathing?: Yes (Per patient, not a reliable historian) Independently performs ADLs?: No Communication: Independent Dressing (OT): Needs assistance Is this a change from baseline?: Change from baseline, expected to last >3 days Grooming: Needs assistance Is this a change from baseline?: Change from baseline, expected to last >3 days Feeding: Independent Bathing: Needs assistance Is this a change from baseline?: Change from baseline, expected to last >3 days Toileting: Needs assistance Is this a change from baseline?: Change from baseline, expected to last >3days In/Out Bed: Needs assistance Is this a change from baseline?: Change from baseline, expected to last <3 days Walks in Home: Needs assistance Is this a change from baseline?: Change from baseline, expected to last <3 days Does the patient have difficulty walking or climbing stairs?: Yes Weakness of Legs: Both Weakness of Arms/Hands: Both  Permission Sought/Granted                  Emotional Assessment       Orientation: : Fluctuating Orientation (Suspected and/or reported Sundowners)      Admission diagnosis:  Hypokalemia [E87.6] Acute encephalopathy [G93.40] Acute renal failure, unspecified acute renal failure type (HCC) [N17.9] Uncontrolled diabetes mellitus with hypoglycemic coma, without long-term current use of insulin (HCC) [E11.641] COVID-19 [U07.1] Patient Active Problem List   Diagnosis Date  Noted   Malnutrition of moderate degree 11/29/2022   HTN (hypertension) 11/28/2022   Asthma 11/28/2022   Uncontrolled diabetes mellitus with hypoglycemic coma, without long-term current use of insulin (HCC) 11/28/2022   AKI (acute kidney injury) (HCC) 11/28/2022   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   Dementia without behavioral disturbance (HCC) 11/28/2022   High anion gap metabolic acidosis 11/28/2022   Hypokalemia 11/28/2022   Hypocalcemia 11/28/2022   Anemia 11/28/2022    COVID-19 virus infection 11/28/2022   Hypercholesterolemia    Acute confusional state 10/31/2022   Auditory hallucinations 10/31/2022   Status post reverse total shoulder replacement, 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 [place] [address] Phone: [phone] Fax: [phone]     Social Determinants of Health (SDOH) Social History: SDOH Screenings   Food Insecurity: Patient Unable To Answer (11/28/2022)  Housing: Patient Unable To Answer (11/28/2022)  Transportation Needs: Patient Unable To Answer (11/28/2022)  Utilities: Patient Unable To Answer (11/28/2022)  Financial Resource Strain: Low Risk  (11/22/2022)   Received from [facility]  Tobacco Use: Low Risk  (11/22/2022)   Received from [place]   SDOH Interventions:     Readmission Risk Interventions     No data to display

## 2022-12-01 NOTE — Progress Notes (Signed)
[facility]  ROUNDING NOTE   Subjective:   Patient with agitation over night. Patient was given olanzapine.   Patient somnolent this morning and unable to give a history.   UOP . Foley catheter  Creatinine 4.2 (6.15)  NS at 125ML/hr   Objective:  Vital signs in last 24 hours:  Temp:  [98 F (36.7 C)-98.6 F (37 C)] 98.1 F (36.7 C) (09/14 0732) Pulse Rate:  [38-102] 90 (09/14 0732) Resp:  [12-23] 17 (09/14 0732) BP: (128-183)/(59-97) 162/69 (09/14 0732) SpO2:  [88 %-99 %] 92 % (09/14 0732) Weight:  [65 kg] 65 kg (09/14 0600)  Weight change: -0.9 kg Filed Weights   11/28/22 2130 11/30/22 0437 12/01/22 0600  Weight: 61.8 kg 65.9 kg 65 kg    Intake/Output: I/O last 3 completed shifts: In: 3079.6 [P.O.:120; I.V.:2959.6] Out: 1077 [Urine:1077]   Intake/Output this shift:  Total I/O In: -  Out: 650 [Urine:650]  Physical Exam: General: No acute distress, laying in bed  Head: Normocephalic, atraumatic. Moist oral mucosal membranes  Neck: Supple  Lungs:  clear  Heart: regular  Abdomen:  Nondistended  Extremities: No peripheral edema.  Neurologic: somnolent  Skin: No acute rash  Access: No hemodialysis access    Basic Metabolic Panel: Recent Labs  Lab 11/28/22 2120 11/28/22 2123 11/29/22 0020 11/29/22 0514 11/30/22 0040 11/30/22 0601 12/01/22 0538  NA  --   --  142 139 137 138 139  K  --   --  2.7* 3.2* 4.3 4.0 3.8  CL  --   --  101 100 101 102 108  CO2  --   --  23 21* 22 21* 20*  GLUCOSE  --   --  112* 131* 162* 157* 150*  BUN  --   --  43* 41* 47* 46* 53*  CREATININE  --   --  6.95* 6.82* 6.64* 6.15* 4.20*  CALCIUM  --   --  6.9* 7.4* 8.1* 8.0* 8.4*  MG 1.0*  --   --  3.9*  --  3.1* 2.4  PHOS  --  5.4*  --  4.0  --  4.2 4.3    Liver Function Tests: Recent Labs  Lab 11/28/22 1803 11/30/22 0601 12/01/22 0538  AST 21  --   --   ALT 11  --   --   ALKPHOS 87  --   --   BILITOT 1.1  --   --   PROT 6.8  --   --   ALBUMIN  2.3* 2.2* 1.8*   No results for input(s): "LIPASE", "AMYLASE" in the last 168 hours. Recent Labs  Lab 11/28/22 1812  AMMONIA 15    CBC: Recent Labs  Lab 11/28/22 1803 11/29/22 0514 11/30/22 0601  WBC 13.9* 14.7* 11.9*  NEUTROABS 11.9*  --  9.5*  HGB 11.5* 11.4* 11.9*  HCT 34.2* 33.6* 35.2*  MCV 84.7 84.4 83.2  PLT 279 311 309    Cardiac Enzymes: Recent Labs  Lab 11/30/22 0040  CKTOTAL 161    BNP: Invalid input(s): "POCBNP"  CBG: Recent Labs  Lab 11/30/22 1126 11/30/22 1627 11/30/22 1839 11/30/22 2205 12/01/22 0737  GLUCAP 133* 150* 120* 116* 89    Microbiology: Results for orders placed or performed during the hospital encounter of 11/28/22  Resp panel by RT-PCR (RSV, Flu A&B, Covid) Anterior Nasal Swab     Status: Abnormal   Collection Time: 11/28/22  6:12 PM   Specimen: Anterior Nasal Swab  Result Value Ref Range  Status   SARS Coronavirus 2 by RT PCR POSITIVE (A) NEGATIVE Final    Comment: (NOTE) SARS-CoV-2 target nucleic acids are DETECTED.  The SARS-CoV-2 RNA is generally detectable in upper respiratory specimens during the acute phase of infection. Positive results are indicative of the presence of the identified virus, but do not rule out bacterial infection or co-infection with other pathogens not detected by the test. Clinical correlation with patient history and other diagnostic information is necessary to determine patient infection status. The expected result is Negative.  Fact Sheet for Patients: BloggerCourse.com  Fact Sheet for Healthcare Providers: SeriousBroker.it  This test is not yet approved or cleared by the [place] FDA and  has been authorized for detection and/or diagnosis of SARS-CoV-2 by FDA under an Emergency Use Authorization (EUA).  This EUA will remain in effect (meaning this test can be used) for the duration of  the COVID-19 declaration under Section 564(b)(1)  of the A ct, 21 U.S.C. section 360bbb-3(b)(1), unless the authorization is terminated or revoked sooner.     Influenza A by PCR NEGATIVE NEGATIVE Final   Influenza B by PCR NEGATIVE NEGATIVE Final    Comment: (NOTE) The Xpert Xpress SARS-CoV-2/FLU/RSV plus assay is intended as an aid in the diagnosis of influenza from Nasopharyngeal swab specimens and should not be used as a sole basis for treatment. Nasal washings and aspirates are unacceptable for Xpert Xpress SARS-CoV-2/FLU/RSV testing.  Fact Sheet for Patients: BloggerCourse.com  Fact Sheet for Healthcare Providers: SeriousBroker.it  This test is not yet approved or cleared by the [place] FDA and has been authorized for detection and/or diagnosis of SARS-CoV-2 by FDA under an Emergency Use Authorization (EUA). This EUA will remain in effect (meaning this test can be used) for the duration of the COVID-19 declaration under Section 564(b)(1) of the Act, 21 U.S.C. section 360bbb-3(b)(1), unless the authorization is terminated or revoked.     Resp Syncytial Virus by PCR NEGATIVE NEGATIVE Final    Comment: (NOTE) Fact Sheet for Patients: BloggerCourse.com  Fact Sheet for Healthcare Providers: SeriousBroker.it  This test is not yet approved or cleared by the [place] FDA and has been authorized for detection and/or diagnosis of SARS-CoV-2 by FDA under an Emergency Use Authorization (EUA). This EUA will remain in effect (meaning this test can be used) for the duration of the COVID-19 declaration under Section 564(b)(1) of the Act, 21 U.S.C. section 360bbb-3(b)(1), unless the authorization is terminated or revoked.  Performed at [facility], [address] [place], [place] [address]   Urine Culture     Status: None   Collection Time: 11/28/22  6:39 PM   Specimen: Urine, Random  Result Value Ref Range  Status   Specimen Description   Final    URINE, RANDOM Performed at [facility], [address], [place], [place] [address]    Special Requests   Final    NONE Reflexed from [id number] Performed at [facility], [address], [place], [place] [address]    Culture   Final    NO GROWTH Performed at [facility], [address] [place], [place] [address]    Report Status 11/30/2022 FINAL  Final  MRSA Next Gen by PCR, Nasal     Status: None   Collection Time: 11/28/22 10:28 PM   Specimen: Nasal Mucosa; Nasal Swab  Result Value Ref Range Status   MRSA by PCR Next Gen NOT DETECTED NOT DETECTED Final    Comment: (NOTE)  The GeneXpert MRSA Assay (FDA approved for NASAL specimens only), is one component of a comprehensive MRSA colonization surveillance program. It is not intended to diagnose MRSA infection nor to guide or monitor treatment for MRSA infections. Test performance is not FDA approved in patients less than 60 years old. Performed at [facility], [address] [place], [place] [address]     Coagulation Studies: No results for input(s): "LABPROT", "INR" in the last 72 hours.  Urinalysis: Recent Labs    11/28/22 1839  COLORURINE AMBER*  LABSPEC 1.017  PHURINE 5.0  GLUCOSEU NEGATIVE  HGBUR NEGATIVE  BILIRUBINUR MODERATE*  KETONESUR NEGATIVE  PROTEINUR 100*  NITRITE NEGATIVE  LEUKOCYTESUR NEGATIVE      Imaging: No results found.   Medications:    sodium chloride Stopped (11/30/22 2100)    amLODipine  2.5 mg Oral Daily   feeding supplement  237 mL Oral TID BM   heparin injection (subcutaneous)  5,000 Units Subcutaneous Q8H   hydrALAZINE  10 mg Oral Q8H   multivitamin with minerals  1 tablet Oral Daily   acetaminophen **OR** acetaminophen, dextrose, hydrALAZINE, HYDROcodone-acetaminophen, ondansetron **OR** ondansetron (ZOFRAN) IV, mouth rinse  Assessment/ Plan:  72 y.o. female Ms. Kristie Hoover is a 72 y.o. female  with diabetes mellitus type II, GERD, hyperlipidemia, hypertension, degenerative disc disease, asthma, and dementia who is admitted to [facility] on 11/28/2022 for Hypokalemia [E87.6] Acute encephalopathy [G93.40] Acute renal failure, unspecified acute renal failure type (HCC) [N17.9] Uncontrolled diabetes mellitus with hypoglycemic coma, without long-term current use of insulin (HCC) [E11.641] COVID-19 [U07.1]   1.  Acute kidney injury most likely secondary to acute tubular necrosis.  Baseline creatinine at 0.89 with normal GFR on 10/31/2022.  No obstruction. No IV contrast exposure.  - nonoliguric urine output - improving renal function - no indication for dialysis treatment - Continue IV fluids - holding etodolac - holding furosemide  2.  Hypokalemia.  Improved. Secondary to furosemide.  - home regimen includes potassium chloride.   3.  Posterior bladder mass.  Noted on renal ultrasound.  Urology has seen the patient and recommends outpatient follow up.   4. Hypertension: blood pressure at goal - on amlodipine and hydralazine.   5. Diabetes mellitus type II with renal manifestations - holding glipizide and metformin   LOS: 3 Kristie Hoover 9/14/202410:43 AM

## 2022-12-01 NOTE — Progress Notes (Signed)
Patient reassessed after administration of IM Zyprexa. Patient resting with even rise and fall of chest. No signs of distress at this time.

## 2022-12-01 NOTE — Progress Notes (Addendum)
Progress Note    Kristie Hoover  ZOX:[id number] DOB: 1950-05-30  DOA: 11/28/2022 PCP: [facility]      Brief Narrative:    Medical records reviewed and are as summarized below:  Kristie Hoover is a 72 y.o. female with medical history significant for DM, HTN, HLD, recently evaluated by neurologist Dr. Malvin Johns on 11/22/2022 with a new diagnosis of memory impairment and delusional disorder after being evaluated for for 4-month history of progressive memory loss, delusions about property being stolen and concern for medication compliance.  She was started on risperidone. She was also seen in the emergency department by psychiatrist in August 2024 for confusion, paranoia, hearing voices and poor memory. She presented to the emergency department after she was found unresponsive at home.  Family had been trying to get in touch with her without any response.  When EMS arrived, she was given Narcan without any response.  She was then found to have a blood glucose level of 23.  She was given D10 with improvement in her glucose by the time she arrived in the emergency department.   In the ED, she was found to have AKI with creatinine of 6.56, hypokalemia with potassium of 2.5, hypomagnesemia with magnesium of 1.0.  Glucose level dropped again from 107-23 in the emergency department.  She had recurrent hypoglycemic episodes in the ED.  She was started on 10% dextrose infusion for recurrent hypoglycemia and IV normal saline infusion for AKI.  Renal ultrasound also revealed a large bladder mass.        Assessment/Plan:   Principal Problem:   Uncontrolled diabetes mellitus with hypoglycemic coma, without long-term current use of insulin (HCC) Active Problems:   AKI (acute kidney injury) (HCC)   High anion gap metabolic acidosis   Hypokalemia   COVID-19 virus infection   Auditory hallucinations   Delusional disorder (HCC)   Hypocalcemia   HTN (hypertension)   Dementia without  behavioral disturbance (HCC)   Asthma   Hypercholesterolemia   Anemia   Malnutrition of moderate degree    Body mass index is 22.44 kg/m.   Type II DM with recurrent hypoglycemia: Glucose levels have stabilized.  off of IV 10% dextrose infusion.  Continue to monitor glucose levels. She was on glipizide-metformin at home.  This has been held.   Acute kidney injury: Creatinine is trending down.  Urine output has improved.  Creatinine is down from 6.95-6.15-4.20.  No signs of fluid overload thus far.   Continue IV fluids but decrease rate from 125 mL/h to 75 mL/h.  Monitor BMP and urine output.  Follow-up with nephrologist for further recommendations.    Hypokalemia, hypomagnesemia and hypocalcemia: Improved after repletion   Large bladder mass: Outpatient follow-up with urologist for cystoscopy.    Acute metabolic encephalopathy: Patient appears to be back to her baseline mental status but she has underlying dementia.   Auditory hallucinations, delusional disorder, dementia with behavioral disturbance: Continue supportive care.   Hypertension: She has been started on amlodipine and hydralazine.   COVID-19 infection: She is tolerating room air.  Supportive care as needed.   General Weakness: PT and OT evaluation   Other comorbidities include hypertension, asthma, hypercholesterolemia, chronic anemia   Transfer from progressive cardiac unit to MedSurg unit   Diet Order             Diet regular Room service appropriate? Yes; Fluid consistency: Thin  Diet effective now  Consultants: Nephrologist Urologist  Procedures: None    Medications:    amLODipine  2.5 mg Oral Daily   feeding supplement  237 mL Oral TID BM   heparin injection (subcutaneous)  5,000 Units Subcutaneous Q8H   hydrALAZINE  10 mg Oral Q8H   multivitamin with minerals  1 tablet Oral Daily   Continuous Infusions:  sodium chloride Stopped (11/30/22  2100)     Anti-infectives (From admission, onward)    None              Family Communication/Anticipated D/C date and plan/Code Status   DVT prophylaxis: heparin injection 5,000 Units Start: 11/28/22 2200     Code Status: Full Code  Family Communication: None Disposition Plan: Plan to discharge home when medically stable   Status is: Inpatient Remains inpatient appropriate because: Severe AKI       Subjective:   Interval events noted.  She is confused.  She has been refusing her medications.  She is unable to provide any history. Praweena, RN was at the bedside  Objective:    Vitals:   12/01/22 0023 12/01/22 0359 12/01/22 0600 12/01/22 0732  BP: (!) 152/77 (!) 177/80  (!) 162/69  Pulse: 85 82  90  Resp:  16  17  Temp: 98.3 F (36.8 C) 98 F (36.7 C)  98.1 F (36.7 C)  TempSrc: Oral Oral  Oral  SpO2: 98%   92%  Weight:   65 kg   Height:       No data found.   Intake/Output Summary (Last 24 hours) at 12/01/2022 0847 Last data filed at 12/01/2022 0245 Gross per 24 hour  Intake 1611.47 ml  Output 847 ml  Net 764.47 ml   Filed Weights   11/28/22 2130 11/30/22 0437 12/01/22 0600  Weight: 61.8 kg 65.9 kg 65 kg    Exam:  GEN: NAD SKIN: Warm and dry EYES: No pallor or icterus ENT: MMM CV: RRR PULM: CTA B ABD: soft, ND, NT, +BS CNS: AAO x 1 (person), non focal EXT: No edema or tenderness GU: Foley catheter draining adequate amount of amber urine    Data Reviewed:   I have personally reviewed following labs and imaging studies:  Labs: Labs show the following:   Basic Metabolic Panel: Recent Labs  Lab 11/28/22 2120 11/28/22 2123 11/29/22 0020 11/29/22 0514 11/30/22 0040 11/30/22 0601 12/01/22 0538  NA  --   --  142 139 137 138 139  K  --   --  2.7* 3.2* 4.3 4.0 3.8  CL  --   --  101 100 101 102 108  CO2  --   --  23 21* 22 21* 20*  GLUCOSE  --   --  112* 131* 162* 157* 150*  BUN  --   --  43* 41* 47* 46* 53*  CREATININE   --   --  6.95* 6.82* 6.64* 6.15* 4.20*  CALCIUM  --   --  6.9* 7.4* 8.1* 8.0* 8.4*  MG 1.0*  --   --  3.9*  --  3.1* 2.4  PHOS  --  5.4*  --  4.0  --  4.2 4.3   GFR Estimated Creatinine Clearance: 11.4 mL/min (A) (by C-G formula based on SCr of 4.2 mg/dL (H)). Liver Function Tests: Recent Labs  Lab 11/28/22 1803 11/30/22 0601 12/01/22 0538  AST 21  --   --   ALT 11  --   --   ALKPHOS 87  --   --  BILITOT 1.1  --   --   PROT 6.8  --   --   ALBUMIN 2.3* 2.2* 1.8*   No results for input(s): "LIPASE", "AMYLASE" in the last 168 hours. Recent Labs  Lab 11/28/22 1812  AMMONIA 15   Coagulation profile No results for input(s): "INR", "PROTIME" in the last 168 hours.  CBC: Recent Labs  Lab 11/28/22 1803 11/29/22 0514 11/30/22 0601  WBC 13.9* 14.7* 11.9*  NEUTROABS 11.9*  --  9.5*  HGB 11.5* 11.4* 11.9*  HCT 34.2* 33.6* 35.2*  MCV 84.7 84.4 83.2  PLT 279 311 309   Cardiac Enzymes: Recent Labs  Lab 11/30/22 0040  CKTOTAL 161   BNP (last 3 results) No results for input(s): "PROBNP" in the last 8760 hours. CBG: Recent Labs  Lab 11/30/22 1126 11/30/22 1627 11/30/22 1839 11/30/22 2205 12/01/22 0737  GLUCAP 133* 150* 120* 116* 89   D-Dimer: No results for input(s): "DDIMER" in the last 72 hours. Hgb A1c: Recent Labs    11/28/22 1803  HGBA1C 6.0*   Lipid Profile: No results for input(s): "CHOL", "HDL", "LDLCALC", "TRIG", "CHOLHDL", "LDLDIRECT" in the last 72 hours. Thyroid function studies: No results for input(s): "TSH", "T4TOTAL", "T3FREE", "THYROIDAB" in the last 72 hours.  Invalid input(s): "FREET3" Anemia work up: No results for input(s): "VITAMINB12", "FOLATE", "FERRITIN", "TIBC", "IRON", "RETICCTPCT" in the last 72 hours. Sepsis Labs: Recent Labs  Lab 11/28/22 1803 11/28/22 1915 11/29/22 0514 11/30/22 0601  WBC 13.9*  --  14.7* 11.9*  LATICACIDVEN  --  1.3  --   --     Microbiology Recent Results (from the past 240 hour(s))  Resp panel by  RT-PCR (RSV, Flu A&B, Covid) Anterior Nasal Swab     Status: Abnormal   Collection Time: 11/28/22  6:12 PM   Specimen: Anterior Nasal Swab  Result Value Ref Range Status   SARS Coronavirus 2 by RT PCR POSITIVE (A) NEGATIVE Final    Comment: (NOTE) SARS-CoV-2 target nucleic acids are DETECTED.  The SARS-CoV-2 RNA is generally detectable in upper respiratory specimens during the acute phase of infection. Positive results are indicative of the presence of the identified virus, but do not rule out bacterial infection or co-infection with other pathogens not detected by the test. Clinical correlation with patient history and other diagnostic information is necessary to determine patient infection status. The expected result is Negative.  Fact Sheet for Patients: BloggerCourse.com  Fact Sheet for Healthcare Providers: SeriousBroker.it  This test is not yet approved or cleared by the [place] FDA and  has been authorized for detection and/or diagnosis of SARS-CoV-2 by FDA under an Emergency Use Authorization (EUA).  This EUA will remain in effect (meaning this test can be used) for the duration of  the COVID-19 declaration under Section 564(b)(1) of the A ct, 21 U.S.C. section 360bbb-3(b)(1), unless the authorization is terminated or revoked sooner.     Influenza A by PCR NEGATIVE NEGATIVE Final   Influenza B by PCR NEGATIVE NEGATIVE Final    Comment: (NOTE) The Xpert Xpress SARS-CoV-2/FLU/RSV plus assay is intended as an aid in the diagnosis of influenza from Nasopharyngeal swab specimens and should not be used as a sole basis for treatment. Nasal washings and aspirates are unacceptable for Xpert Xpress SARS-CoV-2/FLU/RSV testing.  Fact Sheet for Patients: BloggerCourse.com  Fact Sheet for Healthcare Providers: SeriousBroker.it  This test is not yet approved or cleared by the  [place] FDA and has been authorized for detection and/or diagnosis of SARS-CoV-2  by FDA under an Emergency Use Authorization (EUA). This EUA will remain in effect (meaning this test can be used) for the duration of the COVID-19 declaration under Section 564(b)(1) of the Act, 21 U.S.C. section 360bbb-3(b)(1), unless the authorization is terminated or revoked.     Resp Syncytial Virus by PCR NEGATIVE NEGATIVE Final    Comment: (NOTE) Fact Sheet for Patients: BloggerCourse.com  Fact Sheet for Healthcare Providers: SeriousBroker.it  This test is not yet approved or cleared by the [place] FDA and has been authorized for detection and/or diagnosis of SARS-CoV-2 by FDA under an Emergency Use Authorization (EUA). This EUA will remain in effect (meaning this test can be used) for the duration of the COVID-19 declaration under Section 564(b)(1) of the Act, 21 U.S.C. section 360bbb-3(b)(1), unless the authorization is terminated or revoked.  Performed at [facility], [address] [place], [place] [address]   Urine Culture     Status: None   Collection Time: 11/28/22  6:39 PM   Specimen: Urine, Random  Result Value Ref Range Status   Specimen Description   Final    URINE, RANDOM Performed at [facility] [place], [address] [place], [place] [address]    Special Requests   Final    NONE Reflexed from [id number] Performed at [facility], [address], [place], [place] [address]    Culture   Final    NO GROWTH Performed at [facility], [address] [place], [place] [address]    Report Status 11/30/2022 FINAL  Final  MRSA Next Gen by PCR, Nasal     Status: None   Collection Time: 11/28/22 10:28 PM   Specimen: Nasal Mucosa; Nasal Swab  Result Value Ref Range Status   MRSA by PCR Next Gen NOT DETECTED NOT DETECTED Final    Comment: (NOTE) The GeneXpert MRSA Assay (FDA approved for  NASAL specimens only), is one component of a comprehensive MRSA colonization surveillance program. It is not intended to diagnose MRSA infection nor to guide or monitor treatment for MRSA infections. Test performance is not FDA approved in patients less than 65 years old. Performed at [facility], [address] [place], [place] [address]     Procedures and diagnostic studies:  No results found.             LOS: 3 days   Kristie Hoover  [facility]   Pager on [email or web address]. If 7PM-7AM, please contact night-coverage at [email or web address]     12/01/2022, 8:47 AM

## 2022-12-02 DIAGNOSIS — E11641 Type 2 diabetes mellitus with hypoglycemia with coma: Secondary | ICD-10-CM | POA: Diagnosis not present

## 2022-12-02 LAB — RENAL FUNCTION PANEL
Albumin: 2 g/dL — ABNORMAL LOW (ref 3.5–5.0)
Anion gap: 11 (ref 5–15)
BUN: 46 mg/dL — ABNORMAL HIGH (ref 8–23)
CO2: 20 mmol/L — ABNORMAL LOW (ref 22–32)
Calcium: 8.3 mg/dL — ABNORMAL LOW (ref 8.9–10.3)
Chloride: 110 mmol/L (ref 98–111)
Creatinine, Ser: 2.55 mg/dL — ABNORMAL HIGH (ref 0.44–1.00)
GFR, Estimated: 19 mL/min — ABNORMAL LOW (ref >60)
Glucose, Bld: 124 mg/dL — ABNORMAL HIGH (ref 70–99)
Phosphorus: 4 mg/dL (ref 2.5–4.6)
Potassium: 3.3 mmol/L — ABNORMAL LOW (ref 3.5–5.1)
Sodium: 141 mmol/L (ref 135–145)

## 2022-12-02 LAB — CBC
HCT: 31.6 % — ABNORMAL LOW (ref 36.0–46.0)
Hemoglobin: 10.8 g/dL — ABNORMAL LOW (ref 12.0–15.0)
MCH: 28.3 pg (ref 26.0–34.0)
MCHC: 34.2 g/dL (ref 30.0–36.0)
MCV: 82.7 fL (ref 80.0–100.0)
Platelets: 297 10*3/uL (ref 150–400)
RBC: 3.82 MIL/uL — ABNORMAL LOW (ref 3.87–5.11)
RDW: 16 % — ABNORMAL HIGH (ref 11.5–15.5)
WBC: 8.5 10*3/uL (ref 4.0–10.5)
nRBC: 0 % (ref 0.0–0.2)

## 2022-12-02 LAB — MAGNESIUM: Magnesium: 2 mg/dL (ref 1.7–2.4)

## 2022-12-02 LAB — GLUCOSE, CAPILLARY
Glucose-Capillary: 80 mg/dL (ref 70–99)
Glucose-Capillary: 158 mg/dL — ABNORMAL HIGH (ref 70–99)
Glucose-Capillary: 135 mg/dL — ABNORMAL HIGH (ref 70–99)
Glucose-Capillary: 181 mg/dL — ABNORMAL HIGH (ref 70–99)

## 2022-12-02 MED ORDER — POTASSIUM CHLORIDE CRYS ER 20 MEQ PO TBCR
40.0000 meq | EXTENDED_RELEASE_TABLET | Freq: Once | ORAL | Status: AC
  Administered 2022-12-02: 40 meq via ORAL
  Filled 2022-12-02: qty 2

## 2022-12-02 MED ORDER — POTASSIUM CHLORIDE IN NACL 20-0.9 MEQ/L-% IV SOLN
INTRAVENOUS | Status: DC
  Filled 2022-12-02 (×3): qty 1000

## 2022-12-02 NOTE — Consult Note (Signed)
PHARMACY CONSULT NOTE - ELECTROLYTES  Pharmacy Consult for Electrolyte Monitoring and Replacement   Recent Labs: Potassium (mmol/L)  Date Value  12/02/2022 3.3 (L)   Magnesium (mg/dL)  Date Value  57/84/6962 2.0   Calcium (mg/dL)  Date Value  95/28/4132 8.3 (L)   Albumin (g/dL)  Date Value  44/03/270 2.0 (L)   Phosphorus (mg/dL)  Date Value  53/66/4403 4.0   Sodium (mmol/L)  Date Value  12/02/2022 141   Height: 5\' 6"  (167.6 cm) Weight: 67 kg (147 lb 11.3 oz) IBW/kg (Calculated) : 59.3 Estimated Creatinine Clearance: 18.1 mL/min (A) (by C-G formula based on SCr of 2.55 mg/dL (H)).  Assessment  Kristie Hoover is a 72 y.o. female presenting with AKI and bladder mass. PMH significant for DM, HTN, HLD, memory impairment. Pharmacy has been consulted to monitor and replace electrolytes.  Diet: Regular MIVF: NS @ 74mL/hr Pertinent medications: N/A  Goal of Therapy: Electrolytes within normal limits  Plan:  K 3.3   Scr 2.55  MD ordered KCL 40 meq PO x 1    Follow-up electrolytes as ordered by primary team and replacement when indicated  Thank you for allowing pharmacy to be a part of this patient's care.  Akiba Melfi A 12/02/2022 7:58 AM

## 2022-12-02 NOTE — Progress Notes (Signed)
[facility]  ROUNDING NOTE   Subjective:   Patient resting in bed. States she had no complaints.   UOP . Foley catheter  Creatinine 2.55 (4.2) (6.15)  NS at 125ML/hr   Objective:  Vital signs in last 24 hours:  Temp:  [97.6 F (36.4 C)-98.3 F (36.8 C)] 98.3 F (36.8 C) (09/15 0752) Pulse Rate:  [76-87] 87 (09/15 0752) Resp:  [16-17] 17 (09/15 0752) BP: (153-170)/(76-85) 153/77 (09/15 0752) SpO2:  [97 %-99 %] 97 % (09/15 0752) Weight:  [65 kg-67 kg] 67 kg (09/15 0500)  Weight change: 0 kg Filed Weights   12/01/22 0600 12/01/22 1058 12/02/22 0500  Weight: 65 kg 65 kg 67 kg    Intake/Output: I/O last 3 completed shifts: In: 366.6 [I.V.:366.6] Out: 3000 [Urine:3000]   Intake/Output this shift:  Total I/O In: -  Out: 350 [Urine:350]  Physical Exam: General: No acute distress, laying in bed  Head: Normocephalic, atraumatic. Moist oral mucosal membranes  Neck: Supple  Lungs:  clear  Heart: regular  Abdomen:  Nondistended  Extremities: No peripheral edema.  Neurologic: alert  Skin: No acute rash  Access: No hemodialysis access    Basic Metabolic Panel: Recent Labs  Lab 11/28/22 2120 11/28/22 2123 11/29/22 0020 11/29/22 0514 11/30/22 0040 11/30/22 0601 12/01/22 0538 12/02/22 0337  NA  --   --    < > 139 137 138 139 141  K  --   --    < > 3.2* 4.3 4.0 3.8 3.3*  CL  --   --    < > 100 101 102 108 110  CO2  --   --    < > 21* 22 21* 20* 20*  GLUCOSE  --   --    < > 131* 162* 157* 150* 124*  BUN  --   --    < > 41* 47* 46* 53* 46*  CREATININE  --   --    < > 6.82* 6.64* 6.15* 4.20* 2.55*  CALCIUM  --   --    < > 7.4* 8.1* 8.0* 8.4* 8.3*  MG 1.0*  --   --  3.9*  --  3.1* 2.4 2.0  PHOS  --  5.4*  --  4.0  --  4.2 4.3 4.0   < > = values in this interval not displayed.    Liver Function Tests: Recent Labs  Lab 11/28/22 1803 11/30/22 0601 12/01/22 0538 12/02/22 0337  AST 21  --   --   --   ALT 11  --   --   --   ALKPHOS 87  --    --   --   BILITOT 1.1  --   --   --   PROT 6.8  --   --   --   ALBUMIN 2.3* 2.2* 1.8* 2.0*   No results for input(s): "LIPASE", "AMYLASE" in the last 168 hours. Recent Labs  Lab 11/28/22 1812  AMMONIA 15    CBC: Recent Labs  Lab 11/28/22 1803 11/29/22 0514 11/30/22 0601 12/02/22 0337  WBC 13.9* 14.7* 11.9* 8.5  NEUTROABS 11.9*  --  9.5*  --   HGB 11.5* 11.4* 11.9* 10.8*  HCT 34.2* 33.6* 35.2* 31.6*  MCV 84.7 84.4 83.2 82.7  PLT 279 311 309 297    Cardiac Enzymes: Recent Labs  Lab 11/30/22 0040  CKTOTAL 161    BNP: Invalid input(s): "POCBNP"  CBG: Recent Labs  Lab 12/01/22 0737 12/01/22 1200 12/01/22 1706  12/01/22 2129 12/02/22 0751  GLUCAP 89 123* 142* 152* 80    Microbiology: Results for orders placed or performed during the hospital encounter of 11/28/22  Resp panel by RT-PCR (RSV, Flu A&B, Covid) Anterior Nasal Swab     Status: Abnormal   Collection Time: 11/28/22  6:12 PM   Specimen: Anterior Nasal Swab  Result Value Ref Range Status   SARS Coronavirus 2 by RT PCR POSITIVE (A) NEGATIVE Final    Comment: (NOTE) SARS-CoV-2 target nucleic acids are DETECTED.  The SARS-CoV-2 RNA is generally detectable in upper respiratory specimens during the acute phase of infection. Positive results are indicative of the presence of the identified virus, but do not rule out bacterial infection or co-infection with other pathogens not detected by the test. Clinical correlation with patient history and other diagnostic information is necessary to determine patient infection status. The expected result is Negative.  Fact Sheet for Patients: BloggerCourse.com  Fact Sheet for Healthcare Providers: SeriousBroker.it  This test is not yet approved or cleared by the [place] FDA and  has been authorized for detection and/or diagnosis of SARS-CoV-2 by FDA under an Emergency Use Authorization (EUA).  This EUA  will remain in effect (meaning this test can be used) for the duration of  the COVID-19 declaration under Section 564(b)(1) of the A ct, 21 U.S.C. section 360bbb-3(b)(1), unless the authorization is terminated or revoked sooner.     Influenza A by PCR NEGATIVE NEGATIVE Final   Influenza B by PCR NEGATIVE NEGATIVE Final    Comment: (NOTE) The Xpert Xpress SARS-CoV-2/FLU/RSV plus assay is intended as an aid in the diagnosis of influenza from Nasopharyngeal swab specimens and should not be used as a sole basis for treatment. Nasal washings and aspirates are unacceptable for Xpert Xpress SARS-CoV-2/FLU/RSV testing.  Fact Sheet for Patients: BloggerCourse.com  Fact Sheet for Healthcare Providers: SeriousBroker.it  This test is not yet approved or cleared by the [place] FDA and has been authorized for detection and/or diagnosis of SARS-CoV-2 by FDA under an Emergency Use Authorization (EUA). This EUA will remain in effect (meaning this test can be used) for the duration of the COVID-19 declaration under Section 564(b)(1) of the Act, 21 U.S.C. section 360bbb-3(b)(1), unless the authorization is terminated or revoked.     Resp Syncytial Virus by PCR NEGATIVE NEGATIVE Final    Comment: (NOTE) Fact Sheet for Patients: BloggerCourse.com  Fact Sheet for Healthcare Providers: SeriousBroker.it  This test is not yet approved or cleared by the [place] FDA and has been authorized for detection and/or diagnosis of SARS-CoV-2 by FDA under an Emergency Use Authorization (EUA). This EUA will remain in effect (meaning this test can be used) for the duration of the COVID-19 declaration under Section 564(b)(1) of the Act, 21 U.S.C. section 360bbb-3(b)(1), unless the authorization is terminated or revoked.  Performed at [facility], [address] [place], [place]  [address]   Urine Culture     Status: None   Collection Time: 11/28/22  6:39 PM   Specimen: Urine, Random  Result Value Ref Range Status   Specimen Description   Final    URINE, RANDOM Performed at [facility], [address], [place], [place] [address]    Special Requests   Final    NONE Reflexed from [id number] Performed at [facility], [address], [place], [place] [address]    Culture   Final    NO GROWTH Performed at [facility], [address]  [address] [place], [place] [address]    Report Status 11/30/2022 FINAL  Final  MRSA Next Gen by PCR, Nasal     Status: None   Collection Time: 11/28/22 10:28 PM   Specimen: Nasal Mucosa; Nasal Swab  Result Value Ref Range Status   MRSA by PCR Next Gen NOT DETECTED NOT DETECTED Final    Comment: (NOTE) The GeneXpert MRSA Assay (FDA approved for NASAL specimens only), is one component of a comprehensive MRSA colonization surveillance program. It is not intended to diagnose MRSA infection nor to guide or monitor treatment for MRSA infections. Test performance is not FDA approved in patients less than 60 years old. Performed at [facility], [address] [place], [place] [address]     Coagulation Studies: No results for input(s): "LABPROT", "INR" in the last 72 hours.  Urinalysis: No results for input(s): "COLORURINE", "LABSPEC", "PHURINE", "GLUCOSEU", "HGBUR", "BILIRUBINUR", "KETONESUR", "PROTEINUR", "UROBILINOGEN", "NITRITE", "LEUKOCYTESUR" in the last 72 hours.  Invalid input(s): "APPERANCEUR"     Imaging: No results found.   Medications:    sodium chloride 75 mL/hr at 12/02/22 0200    amLODipine  2.5 mg Oral Daily   feeding supplement  237 mL Oral TID BM   heparin injection (subcutaneous)  5,000 Units Subcutaneous Q8H   hydrALAZINE  10 mg Oral Q8H   multivitamin with minerals  1 tablet Oral Daily   acetaminophen **OR** acetaminophen, dextrose, hydrALAZINE, HYDROcodone-acetaminophen,  ondansetron **OR** ondansetron (ZOFRAN) IV, mouth rinse  Assessment/ Plan:  72 y.o. female Ms. JASMIEN BADR is a 72 y.o. female with diabetes mellitus type II, GERD, hyperlipidemia, hypertension, degenerative disc disease, asthma, and dementia who is admitted to [facility] on 11/28/2022 for Hypokalemia [E87.6] Acute encephalopathy [G93.40] Acute renal failure, unspecified acute renal failure type (HCC) [N17.9] Uncontrolled diabetes mellitus with hypoglycemic coma, without long-term current use of insulin (HCC) [E11.641] COVID-19 [U07.1]   1.  Acute kidney injury most likely secondary to acute tubular necrosis.  Baseline creatinine at 0.89 with normal GFR on 10/31/2022.  No obstruction. No IV contrast exposure.  - nonoliguric urine output - improving renal function - no indication for dialysis treatment - Continue IV fluids - holding etodolac - holding furosemide  2.  Hypokalemia.  Improved. Secondary to furosemide.  - home regimen includes potassium chloride.  - Add potassium to IV fluids  3.  Posterior bladder mass.  Noted on renal ultrasound.  Urology has seen the patient and recommends outpatient follow up.   4. Hypertension: blood pressure at goal - on amlodipine and hydralazine.   5. Diabetes mellitus type II with renal manifestations - holding glipizide and metformin   LOS: 4 Kristie Hoover 9/15/202410:40 AM

## 2022-12-02 NOTE — Progress Notes (Signed)
Progress Note    Kristie Hoover  WUJ:[id number] DOB: 1950-11-21  DOA: 11/28/2022 PCP: [facility]      Brief Narrative:    Medical records reviewed and are as summarized below:  Kristie Hoover is a 72 y.o. female with medical history significant for DM, HTN, HLD, recently evaluated by neurologist Dr. Malvin Johns on 11/22/2022 with a new diagnosis of memory impairment and delusional disorder after being evaluated for for 61-month history of progressive memory loss, delusions about property being stolen and concern for medication compliance.  She was started on risperidone. She was also seen in the emergency department by psychiatrist in August 2024 for confusion, paranoia, hearing voices and poor memory. She presented to the emergency department after she was found unresponsive at home.  Family had been trying to get in touch with her without any response.  When EMS arrived, she was given Narcan without any response.  She was then found to have a blood glucose level of 23.  She was given D10 with improvement in her glucose by the time she arrived in the emergency department.   In the ED, she was found to have AKI with creatinine of 6.56, hypokalemia with potassium of 2.5, hypomagnesemia with magnesium of 1.0.  Glucose level dropped again from 107-23 in the emergency department.  She had recurrent hypoglycemic episodes in the ED.  She was started on 10% dextrose infusion for recurrent hypoglycemia and IV normal saline infusion for AKI.  Renal ultrasound also revealed a large bladder mass.        Assessment/Plan:   Principal Problem:   Uncontrolled diabetes mellitus with hypoglycemic coma, without long-term current use of insulin (HCC) Active Problems:   AKI (acute kidney injury) (HCC)   High anion gap metabolic acidosis   Hypokalemia   COVID-19 virus infection   Auditory hallucinations   Delusional disorder (HCC)   Hypocalcemia   HTN (hypertension)   Dementia without  behavioral disturbance (HCC)   Asthma   Hypercholesterolemia   Anemia   Malnutrition of moderate degree    Body mass index is 23.84 kg/m.   Type II DM with recurrent hypoglycemia: Glucose levels have stabilized.  off of IV 10% dextrose infusion.  Continue to monitor glucose levels. She was on glipizide-metformin at home.  This has been held. Hemoglobin A1c was 6.0.   Acute kidney injury: Improving.  Urine output is adequate.  Discontinue Foley catheter.  Creatinine is down from 6.95-6.15-4.20-2.55.  No signs of fluid overload.  Continue IV fluids.  Repeat BMP tomorrow.  Follow-up with nephrologist.   Hypokalemia: Replete potassium and monitor levels   Hypomagnesemia and hypocalcemia: Improved after repletion   Large bladder mass: Outpatient follow-up with urologist for cystoscopy.    Acute metabolic encephalopathy: Patient appears to be back to her baseline mental status but she has underlying dementia.   Auditory hallucinations, delusional disorder, dementia with behavioral disturbance: Continue supportive care.   Hypertension: Continue amlodipine and hydralazine.   COVID-19 infection: Asymptomatic.  She is tolerating room air.     General Weakness: PT recommended discharge to SNF.  Follow-up with social worker to assist with disposition.   Other comorbidities include hypertension, asthma, hypercholesterolemia, chronic anemia      Diet Order             Diet regular Room service appropriate? Yes; Fluid consistency: Thin  Diet effective now  Consultants: Nephrologist Urologist  Procedures: None    Medications:    amLODipine  2.5 mg Oral Daily   feeding supplement  237 mL Oral TID BM   heparin injection (subcutaneous)  5,000 Units Subcutaneous Q8H   hydrALAZINE  10 mg Oral Q8H   multivitamin with minerals  1 tablet Oral Daily   Continuous Infusions:  0.9 % NaCl with KCl 20 mEq / L 75 mL/hr at 12/02/22  1146     Anti-infectives (From admission, onward)    None              Family Communication/Anticipated D/C date and plan/Code Status   DVT prophylaxis: heparin injection 5,000 Units Start: 11/28/22 2200     Code Status: Full Code  Family Communication: None Disposition Plan: Plan to discharge to SNF   Status is: Inpatient Remains inpatient appropriate because: Severe AKI       Subjective:   No acute events overnight.  She has no complaints.  She is confused and cannot provide an adequate history.  Objective:    Vitals:   12/02/22 0434 12/02/22 0500 12/02/22 0752 12/02/22 1410  BP: (!) 156/76  (!) 153/77 (!) 171/90  Pulse: 76  87 91  Resp: 16  17 18   Temp: 97.6 F (36.4 C)  98.3 F (36.8 C)   TempSrc: Oral     SpO2: 98%  97% 98%  Weight:  67 kg    Height:       No data found.   Intake/Output Summary (Last 24 hours) at 12/02/2022 1608 Last data filed at 12/02/2022 0830 Gross per 24 hour  Intake --  Output 2100 ml  Net -2100 ml   Filed Weights   12/01/22 0600 12/01/22 1058 12/02/22 0500  Weight: 65 kg 65 kg 67 kg    Exam:  GEN: NAD SKIN: No rash EYES: No pallor or icterus ENT: MMM CV: RRR PULM: CTA B ABD: soft, ND, NT, +BS CNS: AAO x 1 (person), non focal EXT: No edema or tenderness GU: Foley catheter draining amber urine   Data Reviewed:   I have personally reviewed following labs and imaging studies:  Labs: Labs show the following:   Basic Metabolic Panel: Recent Labs  Lab 11/28/22 2120 11/28/22 2123 11/29/22 0020 11/29/22 0514 11/30/22 0040 11/30/22 0601 12/01/22 0538 12/02/22 0337  NA  --   --    < > 139 137 138 139 141  K  --   --    < > 3.2* 4.3 4.0 3.8 3.3*  CL  --   --    < > 100 101 102 108 110  CO2  --   --    < > 21* 22 21* 20* 20*  GLUCOSE  --   --    < > 131* 162* 157* 150* 124*  BUN  --   --    < > 41* 47* 46* 53* 46*  CREATININE  --   --    < > 6.82* 6.64* 6.15* 4.20* 2.55*  CALCIUM  --   --    <  > 7.4* 8.1* 8.0* 8.4* 8.3*  MG 1.0*  --   --  3.9*  --  3.1* 2.4 2.0  PHOS  --  5.4*  --  4.0  --  4.2 4.3 4.0   < > = values in this interval not displayed.   GFR Estimated Creatinine Clearance: 18.1 mL/min (A) (by C-G formula based on SCr of 2.55 mg/dL (H)). Liver Function  Tests: Recent Labs  Lab 11/28/22 1803 11/30/22 0601 12/01/22 0538 12/02/22 0337  AST 21  --   --   --   ALT 11  --   --   --   ALKPHOS 87  --   --   --   BILITOT 1.1  --   --   --   PROT 6.8  --   --   --   ALBUMIN 2.3* 2.2* 1.8* 2.0*   No results for input(s): "LIPASE", "AMYLASE" in the last 168 hours. Recent Labs  Lab 11/28/22 1812  AMMONIA 15   Coagulation profile No results for input(s): "INR", "PROTIME" in the last 168 hours.  CBC: Recent Labs  Lab 11/28/22 1803 11/29/22 0514 11/30/22 0601 12/02/22 0337  WBC 13.9* 14.7* 11.9* 8.5  NEUTROABS 11.9*  --  9.5*  --   HGB 11.5* 11.4* 11.9* 10.8*  HCT 34.2* 33.6* 35.2* 31.6*  MCV 84.7 84.4 83.2 82.7  PLT 279 311 309 297   Cardiac Enzymes: Recent Labs  Lab 11/30/22 0040  CKTOTAL 161   BNP (last 3 results) No results for input(s): "PROBNP" in the last 8760 hours. CBG: Recent Labs  Lab 12/01/22 1200 12/01/22 1706 12/01/22 2129 12/02/22 0751 12/02/22 1126  GLUCAP 123* 142* 152* 80 158*   D-Dimer: No results for input(s): "DDIMER" in the last 72 hours. Hgb A1c: No results for input(s): "HGBA1C" in the last 72 hours.  Lipid Profile: No results for input(s): "CHOL", "HDL", "LDLCALC", "TRIG", "CHOLHDL", "LDLDIRECT" in the last 72 hours. Thyroid function studies: No results for input(s): "TSH", "T4TOTAL", "T3FREE", "THYROIDAB" in the last 72 hours.  Invalid input(s): "FREET3" Anemia work up: No results for input(s): "VITAMINB12", "FOLATE", "FERRITIN", "TIBC", "IRON", "RETICCTPCT" in the last 72 hours. Sepsis Labs: Recent Labs  Lab 11/28/22 1803 11/28/22 1915 11/29/22 0514 11/30/22 0601 12/02/22 0337  WBC 13.9*  --  14.7*  11.9* 8.5  LATICACIDVEN  --  1.3  --   --   --     Microbiology Recent Results (from the past 240 hour(s))  Resp panel by RT-PCR (RSV, Flu A&B, Covid) Anterior Nasal Swab     Status: Abnormal   Collection Time: 11/28/22  6:12 PM   Specimen: Anterior Nasal Swab  Result Value Ref Range Status   SARS Coronavirus 2 by RT PCR POSITIVE (A) NEGATIVE Final    Comment: (NOTE) SARS-CoV-2 target nucleic acids are DETECTED.  The SARS-CoV-2 RNA is generally detectable in upper respiratory specimens during the acute phase of infection. Positive results are indicative of the presence of the identified virus, but do not rule out bacterial infection or co-infection with other pathogens not detected by the test. Clinical correlation with patient history and other diagnostic information is necessary to determine patient infection status. The expected result is Negative.  Fact Sheet for Patients: BloggerCourse.com  Fact Sheet for Healthcare Providers: SeriousBroker.it  This test is not yet approved or cleared by the [place] FDA and  has been authorized for detection and/or diagnosis of SARS-CoV-2 by FDA under an Emergency Use Authorization (EUA).  This EUA will remain in effect (meaning this test can be used) for the duration of  the COVID-19 declaration under Section 564(b)(1) of the A ct, 21 U.S.C. section 360bbb-3(b)(1), unless the authorization is terminated or revoked sooner.     Influenza A by PCR NEGATIVE NEGATIVE Final   Influenza B by PCR NEGATIVE NEGATIVE Final    Comment: (NOTE) The Xpert Xpress SARS-CoV-2/FLU/RSV plus assay is intended  as an aid in the diagnosis of influenza from Nasopharyngeal swab specimens and should not be used as a sole basis for treatment. Nasal washings and aspirates are unacceptable for Xpert Xpress SARS-CoV-2/FLU/RSV testing.  Fact Sheet for  Patients: BloggerCourse.com  Fact Sheet for Healthcare Providers: SeriousBroker.it  This test is not yet approved or cleared by the [place] FDA and has been authorized for detection and/or diagnosis of SARS-CoV-2 by FDA under an Emergency Use Authorization (EUA). This EUA will remain in effect (meaning this test can be used) for the duration of the COVID-19 declaration under Section 564(b)(1) of the Act, 21 U.S.C. section 360bbb-3(b)(1), unless the authorization is terminated or revoked.     Resp Syncytial Virus by PCR NEGATIVE NEGATIVE Final    Comment: (NOTE) Fact Sheet for Patients: BloggerCourse.com  Fact Sheet for Healthcare Providers: SeriousBroker.it  This test is not yet approved or cleared by the [place] FDA and has been authorized for detection and/or diagnosis of SARS-CoV-2 by FDA under an Emergency Use Authorization (EUA). This EUA will remain in effect (meaning this test can be used) for the duration of the COVID-19 declaration under Section 564(b)(1) of the Act, 21 U.S.C. section 360bbb-3(b)(1), unless the authorization is terminated or revoked.  Performed at [facility], [address], [place], [place] [address]   Urine Culture     Status: None   Collection Time: 11/28/22  6:39 PM   Specimen: Urine, Random  Result Value Ref Range Status   Specimen Description   Final    URINE, RANDOM Performed at [facility], [address] [place], [place] [address]    Special Requests   Final    NONE Reflexed from [id number] Performed at [facility], [address] [place], [place] [address]    Culture   Final    NO GROWTH Performed at [facility], [address], [place], [place] [address]    Report Status 11/30/2022 FINAL  Final  MRSA Next Gen by PCR, Nasal     Status: None   Collection Time: 11/28/22 10:28 PM    Specimen: Nasal Mucosa; Nasal Swab  Result Value Ref Range Status   MRSA by PCR Next Gen NOT DETECTED NOT DETECTED Final    Comment: (NOTE) The GeneXpert MRSA Assay (FDA approved for NASAL specimens only), is one component of a comprehensive MRSA colonization surveillance program. It is not intended to diagnose MRSA infection nor to guide or monitor treatment for MRSA infections. Test performance is not FDA approved in patients less than 73 years old. Performed at [facility], [address] [place], [place] [address]     Procedures and diagnostic studies:  No results found.             LOS: 4 days   Schawn Byas  [facility]   Pager on [email or web address]. If 7PM-7AM, please contact night-coverage at [email or web address]     12/02/2022, 4:08 PM

## 2022-12-03 ENCOUNTER — Other Ambulatory Visit: Payer: Self-pay

## 2022-12-03 ENCOUNTER — Encounter: Payer: Self-pay | Admitting: Internal Medicine

## 2022-12-03 DIAGNOSIS — E11641 Type 2 diabetes mellitus with hypoglycemia with coma: Secondary | ICD-10-CM | POA: Diagnosis not present

## 2022-12-03 DIAGNOSIS — N179 Acute kidney failure, unspecified: Secondary | ICD-10-CM | POA: Diagnosis not present

## 2022-12-03 LAB — BLOOD GAS, VENOUS
Acid-base deficit: 1.8 mmol/L (ref 0.0–2.0)
Bicarbonate: 24.7 mmol/L (ref 20.0–28.0)
O2 Saturation: 34.2 %
Patient temperature: 37
pCO2, Ven: 48 mmHg (ref 44–60)
pH, Ven: 7.32 (ref 7.25–7.43)

## 2022-12-03 LAB — GLUCOSE, CAPILLARY
Glucose-Capillary: 114 mg/dL — ABNORMAL HIGH (ref 70–99)
Glucose-Capillary: 150 mg/dL — ABNORMAL HIGH (ref 70–99)
Glucose-Capillary: 121 mg/dL — ABNORMAL HIGH (ref 70–99)
Glucose-Capillary: 125 mg/dL — ABNORMAL HIGH (ref 70–99)

## 2022-12-03 LAB — BASIC METABOLIC PANEL
Anion gap: 8 (ref 5–15)
BUN: 26 mg/dL — ABNORMAL HIGH (ref 8–23)
CO2: 21 mmol/L — ABNORMAL LOW (ref 22–32)
Calcium: 8.3 mg/dL — ABNORMAL LOW (ref 8.9–10.3)
Chloride: 116 mmol/L — ABNORMAL HIGH (ref 98–111)
Creatinine, Ser: 1.31 mg/dL — ABNORMAL HIGH (ref 0.44–1.00)
GFR, Estimated: 43 mL/min — ABNORMAL LOW (ref >60)
Glucose, Bld: 130 mg/dL — ABNORMAL HIGH (ref 70–99)
Potassium: 3.6 mmol/L (ref 3.5–5.1)
Sodium: 145 mmol/L (ref 135–145)

## 2022-12-03 LAB — MAGNESIUM: Magnesium: 1.7 mg/dL (ref 1.7–2.4)

## 2022-12-03 NOTE — Plan of Care (Signed)
  Problem: Clinical Measurements: Goal: Will remain free from infection Outcome: Progressing   Problem: Clinical Measurements: Goal: Ability to maintain clinical measurements within normal limits will improve Outcome: Progressing   Problem: Elimination: Goal: Will not experience complications related to urinary retention Outcome: Progressing   Problem: Pain Managment: Goal: General experience of comfort will improve Outcome: Progressing   Problem: Elimination: Goal: Will not experience complications related to bowel motility Outcome: Progressing

## 2022-12-03 NOTE — Progress Notes (Addendum)
Progress Note    Kristie Hoover  ZOX:[id number] DOB: January 24, 1951  DOA: 11/28/2022 PCP: [facility]      Brief Narrative:    Medical records reviewed and are as summarized below:  Kristie Hoover is a 72 y.o. female with medical history significant for DM, HTN, HLD, recently evaluated by neurologist Dr. Malvin Johns on 11/22/2022 with a new diagnosis of memory impairment and delusional disorder after being evaluated for for 79-month history of progressive memory loss, delusions about property being stolen and concern for medication compliance.  She was started on risperidone. She was also seen in the emergency department by psychiatrist in August 2024 for confusion, paranoia, hearing voices and poor memory. She presented to the emergency department after she was found unresponsive at home.  Family had been trying to get in touch with her without any response.  When EMS arrived, she was given Narcan without any response.  She was then found to have a blood glucose level of 23.  She was given D10 with improvement in her glucose by the time she arrived in the emergency department.   In the ED, she was found to have AKI with creatinine of 6.56, hypokalemia with potassium of 2.5, hypomagnesemia with magnesium of 1.0.  Glucose level dropped again from 107-23 in the emergency department.  She had recurrent hypoglycemic episodes in the ED.  She was started on 10% dextrose infusion for recurrent hypoglycemia and IV normal saline infusion for AKI.  Renal ultrasound also revealed a large bladder mass.        Assessment/Plan:   Principal Problem:   Uncontrolled diabetes mellitus with hypoglycemic coma, without long-term current use of insulin (HCC) Active Problems:   AKI (acute kidney injury) (HCC)   High anion gap metabolic acidosis   Hypokalemia   COVID-19 virus infection   Auditory hallucinations   Delusional disorder (HCC)   Hypocalcemia   HTN (hypertension)   Dementia without  behavioral disturbance (HCC)   Asthma   Hypercholesterolemia   Anemia   Malnutrition of moderate degree    Body mass index is 23.63 kg/m.   Type II DM with recurrent hypoglycemia: Glucose levels have stabilized.  off of IV 10% dextrose infusion.  Probably better to discontinue glipizide-metformin at discharge and control diabetes with diet for now. Hemoglobin A1c was 6.   Acute kidney injury: Improved.  Urine output is adequate.  Discontinue IV fluids and monitor creatinine off of IV fluids.  Creatinine is down from 6.95-6.15-4.20-2.55-1.31.  Follow-up with nephrologist.   Hypokalemia, hypomagnesemia and hypocalcemia: Improved   Large bladder mass: Outpatient follow-up with urologist for cystoscopy.    Acute metabolic encephalopathy: Patient appears to be back to her baseline mental status but she has underlying dementia.   Auditory hallucinations, delusional disorder, dementia with behavioral disturbance: Continue supportive care.   Hypertension: Continue amlodipine and hydralazine.   COVID-19 infection: Asymptomatic.  She is tolerating room air.     General Weakness: PT recommended discharge to SNF.  Follow-up with social worker to assist with disposition.   Other comorbidities include hypertension, asthma, hypercholesterolemia, chronic anemia      Diet Order             Diet regular Room service appropriate? Yes; Fluid consistency: Thin  Diet effective now                            Consultants: Nephrologist Urologist  Procedures: None  Medications:    amLODipine  2.5 mg Oral Daily   feeding supplement  237 mL Oral TID BM   heparin injection (subcutaneous)  5,000 Units Subcutaneous Q8H   hydrALAZINE  10 mg Oral Q8H   multivitamin with minerals  1 tablet Oral Daily   Continuous Infusions:     Anti-infectives (From admission, onward)    None              Family Communication/Anticipated D/C date and plan/Code  Status   DVT prophylaxis: heparin injection 5,000 Units Start: 11/28/22 2200     Code Status: Full Code  Family Communication: Plan discussed with [name], niece, over the phone Disposition Plan: Plan to discharge to SNF   Status is: Inpatient Remains inpatient appropriate because: Severe AKI, awaiting placement       Subjective:   Interval events noted.  She has no complaints.  She feels better.  No shortness of breath, cough or chest pain.  Objective:    Vitals:   12/02/22 1958 12/03/22 0500 12/03/22 0501 12/03/22 0802  BP: (!) 154/71  (!) 157/92 (!) 168/82  Pulse: 96  96 92  Resp:   16 18  Temp: 98.1 F (36.7 C)  98.4 F (36.9 C) 98.1 F (36.7 C)  TempSrc:      SpO2: 94%  99% 100%  Weight:  66.4 kg    Height:       No data found.   Intake/Output Summary (Last 24 hours) at 12/03/2022 1158 Last data filed at 12/03/2022 0944 Gross per 24 hour  Intake 1719.26 ml  Output 850 ml  Net 869.26 ml   Filed Weights   12/01/22 1058 12/02/22 0500 12/03/22 0500  Weight: 65 kg 67 kg 66.4 kg    Exam:  GEN: NAD SKIN: Warm and dry EYES: No pallor or icterus ENT: MMM CV: RRR PULM: CTA B ABD: soft, ND, NT, +BS CNS: AAO x 1 (person), non focal EXT: No edema or tenderness    Data Reviewed:   I have personally reviewed following labs and imaging studies:  Labs: Labs show the following:   Basic Metabolic Panel: Recent Labs  Lab 11/28/22 2123 11/29/22 0020 11/29/22 0514 11/30/22 0040 11/30/22 0601 12/01/22 0538 12/02/22 0337 12/03/22 0833  NA  --    < > 139 137 138 139 141 145  K  --    < > 3.2* 4.3 4.0 3.8 3.3* 3.6  CL  --    < > 100 101 102 108 110 116*  CO2  --    < > 21* 22 21* 20* 20* 21*  GLUCOSE  --    < > 131* 162* 157* 150* 124* 130*  BUN  --    < > 41* 47* 46* 53* 46* 26*  CREATININE  --    < > 6.82* 6.64* 6.15* 4.20* 2.55* 1.31*  CALCIUM  --    < > 7.4* 8.1* 8.0* 8.4* 8.3* 8.3*  MG  --   --  3.9*  --  3.1* 2.4 2.0 1.7  PHOS 5.4*  --   4.0  --  4.2 4.3 4.0  --    < > = values in this interval not displayed.   GFR Estimated Creatinine Clearance: 35.3 mL/min (A) (by C-G formula based on SCr of 1.31 mg/dL (H)). Liver Function Tests: Recent Labs  Lab 11/28/22 1803 11/30/22 0601 12/01/22 0538 12/02/22 0337  AST 21  --   --   --   ALT 11  --   --   --  ALKPHOS 87  --   --   --   BILITOT 1.1  --   --   --   PROT 6.8  --   --   --   ALBUMIN 2.3* 2.2* 1.8* 2.0*   No results for input(s): "LIPASE", "AMYLASE" in the last 168 hours. Recent Labs  Lab 11/28/22 1812  AMMONIA 15   Coagulation profile No results for input(s): "INR", "PROTIME" in the last 168 hours.  CBC: Recent Labs  Lab 11/28/22 1803 11/29/22 0514 11/30/22 0601 12/02/22 0337  WBC 13.9* 14.7* 11.9* 8.5  NEUTROABS 11.9*  --  9.5*  --   HGB 11.5* 11.4* 11.9* 10.8*  HCT 34.2* 33.6* 35.2* 31.6*  MCV 84.7 84.4 83.2 82.7  PLT 279 311 309 297   Cardiac Enzymes: Recent Labs  Lab 11/30/22 0040  CKTOTAL 161   BNP (last 3 results) No results for input(s): "PROBNP" in the last 8760 hours. CBG: Recent Labs  Lab 12/02/22 1126 12/02/22 1636 12/02/22 2159 12/03/22 0803 12/03/22 1132  GLUCAP 158* 135* 181* 114* 150*   D-Dimer: No results for input(s): "DDIMER" in the last 72 hours. Hgb A1c: No results for input(s): "HGBA1C" in the last 72 hours.  Lipid Profile: No results for input(s): "CHOL", "HDL", "LDLCALC", "TRIG", "CHOLHDL", "LDLDIRECT" in the last 72 hours. Thyroid function studies: No results for input(s): "TSH", "T4TOTAL", "T3FREE", "THYROIDAB" in the last 72 hours.  Invalid input(s): "FREET3" Anemia work up: No results for input(s): "VITAMINB12", "FOLATE", "FERRITIN", "TIBC", "IRON", "RETICCTPCT" in the last 72 hours. Sepsis Labs: Recent Labs  Lab 11/28/22 1803 11/28/22 1915 11/29/22 0514 11/30/22 0601 12/02/22 0337  WBC 13.9*  --  14.7* 11.9* 8.5  LATICACIDVEN  --  1.3  --   --   --     Microbiology Recent Results  (from the past 240 hour(s))  Resp panel by RT-PCR (RSV, Flu A&B, Covid) Anterior Nasal Swab     Status: Abnormal   Collection Time: 11/28/22  6:12 PM   Specimen: Anterior Nasal Swab  Result Value Ref Range Status   SARS Coronavirus 2 by RT PCR POSITIVE (A) NEGATIVE Final    Comment: (NOTE) SARS-CoV-2 target nucleic acids are DETECTED.  The SARS-CoV-2 RNA is generally detectable in upper respiratory specimens during the acute phase of infection. Positive results are indicative of the presence of the identified virus, but do not rule out bacterial infection or co-infection with other pathogens not detected by the test. Clinical correlation with patient history and other diagnostic information is necessary to determine patient infection status. The expected result is Negative.  Fact Sheet for Patients: BloggerCourse.com  Fact Sheet for Healthcare Providers: SeriousBroker.it  This test is not yet approved or cleared by the [place] FDA and  has been authorized for detection and/or diagnosis of SARS-CoV-2 by FDA under an Emergency Use Authorization (EUA).  This EUA will remain in effect (meaning this test can be used) for the duration of  the COVID-19 declaration under Section 564(b)(1) of the A ct, 21 U.S.C. section 360bbb-3(b)(1), unless the authorization is terminated or revoked sooner.     Influenza A by PCR NEGATIVE NEGATIVE Final   Influenza B by PCR NEGATIVE NEGATIVE Final    Comment: (NOTE) The Xpert Xpress SARS-CoV-2/FLU/RSV plus assay is intended as an aid in the diagnosis of influenza from Nasopharyngeal swab specimens and should not be used as a sole basis for treatment. Nasal washings and aspirates are unacceptable for Xpert Xpress SARS-CoV-2/FLU/RSV testing.  Fact Sheet for  Patients: BloggerCourse.com  Fact Sheet for Healthcare  Providers: SeriousBroker.it  This test is not yet approved or cleared by the [place] FDA and has been authorized for detection and/or diagnosis of SARS-CoV-2 by FDA under an Emergency Use Authorization (EUA). This EUA will remain in effect (meaning this test can be used) for the duration of the COVID-19 declaration under Section 564(b)(1) of the Act, 21 U.S.C. section 360bbb-3(b)(1), unless the authorization is terminated or revoked.     Resp Syncytial Virus by PCR NEGATIVE NEGATIVE Final    Comment: (NOTE) Fact Sheet for Patients: BloggerCourse.com  Fact Sheet for Healthcare Providers: SeriousBroker.it  This test is not yet approved or cleared by the [place] FDA and has been authorized for detection and/or diagnosis of SARS-CoV-2 by FDA under an Emergency Use Authorization (EUA). This EUA will remain in effect (meaning this test can be used) for the duration of the COVID-19 declaration under Section 564(b)(1) of the Act, 21 U.S.C. section 360bbb-3(b)(1), unless the authorization is terminated or revoked.  Performed at [facility], [address], [place], [place] [address]   Urine Culture     Status: None   Collection Time: 11/28/22  6:39 PM   Specimen: Urine, Random  Result Value Ref Range Status   Specimen Description   Final    URINE, RANDOM Performed at [facility], [address], [place], [place] [address]    Special Requests   Final    NONE Reflexed from [id number] Performed at [facility], [address] [place], [place] [address]    Culture   Final    NO GROWTH Performed at [facility], [address], [place], [place] [address]    Report Status 11/30/2022 FINAL  Final  MRSA Next Gen by PCR, Nasal     Status: None   Collection Time: 11/28/22 10:28 PM   Specimen: Nasal Mucosa; Nasal Swab  Result Value Ref Range Status   MRSA by PCR  Next Gen NOT DETECTED NOT DETECTED Final    Comment: (NOTE) The GeneXpert MRSA Assay (FDA approved for NASAL specimens only), is one component of a comprehensive MRSA colonization surveillance program. It is not intended to diagnose MRSA infection nor to guide or monitor treatment for MRSA infections. Test performance is not FDA approved in patients less than 19 years old. Performed at [facility], [address] [place], [place] [address]     Procedures and diagnostic studies:  No results found.             LOS: 5 days   Trigo Winterbottom  [facility]   Pager on [email or web address]. If 7PM-7AM, please contact night-coverage at [email or web address]     12/03/2022, 11:58 AM

## 2022-12-03 NOTE — Consult Note (Signed)
PHARMACY CONSULT NOTE - ELECTROLYTES  Pharmacy Consult for Electrolyte Monitoring and Replacement   Recent Labs: Potassium (mmol/L)  Date Value  12/03/2022 3.6   Magnesium (mg/dL)  Date Value  16/12/9602 1.7   Calcium (mg/dL)  Date Value  54/11/8117 8.3 (L)   Albumin (g/dL)  Date Value  14/78/2956 2.0 (L)   Phosphorus (mg/dL)  Date Value  21/30/8657 4.0   Sodium (mmol/L)  Date Value  12/03/2022 145   Height: 5\' 6"  (167.6 cm) Weight: 66.4 kg (146 lb 6.2 oz) IBW/kg (Calculated) : 59.3 Estimated Creatinine Clearance: 35.3 mL/min (A) (by C-G formula based on SCr of 1.31 mg/dL (H)).  Assessment  BREANE MACMAHON is a 72 y.o. female presenting with AKI and bladder mass. PMH significant for DM, HTN, HLD, memory impairment. Pharmacy has been consulted to monitor and replace electrolytes.  Diet: Regular MIVF: NS @ 75 mL/hr Pertinent medications: N/A  Goal of Therapy: Electrolytes within normal limits  Plan:  No electrolyte replacement indicated at this time Follow-up electrolytes as ordered by primary team and replacement when indicated  Thank you for allowing pharmacy to be a part of this patient's care.  Erasmo Leventhal, PharmD Candidate 2025  12/03/2022 10:49 AM

## 2022-12-03 NOTE — Progress Notes (Signed)
Occupational Therapy Treatment Patient Details Name: Kristie Hoover MRN: [id number] DOB: 04/20/1950 Today's Date: 12/03/2022   History of present illness Pt is a 72 y.o. female with a PMHx of diabetes mellitus type 2, GERD, hiatal hernia, hypercholesterolemia, hypertension, degenerative disc disease, asthma, history of memory impairment and delusional disorder, who was admitted to [facility] on 11/28/2022 for evaluation of significant hypoglycemia, COVID-19 infection, acute kidney injury.   OT comments  Pt seen for OT tx. Pt denies complaints. Requires supv for sup>sit EOB, STS with MIN A and CGA to ambulate short distance to [facility] with CGA-MIN A. Pt required CGA in standing for pericare. Pt educated in RW use and able to negotiate obstacles in room with CGA with RW and PRN VC for safety/sequencing. Set up required for self feeding. Pt progressing towards goals; continues to benefit from skilled OT services.       If plan is discharge home, recommend the following:  A lot of help with bathing/dressing/bathroom;Supervision due to cognitive status;Help with stairs or ramp for entrance;A little help with walking and/or transfers   Equipment Recommendations  BSC/3in1    Recommendations for Other Services      Precautions / Restrictions Precautions Precautions: Fall Restrictions Weight Bearing Restrictions: No       Mobility Bed Mobility Overal bed mobility: Needs Assistance Bed Mobility: Supine to Sit     Supine to sit: Supervision          Transfers Overall transfer level: Needs assistance Equipment used: Rolling walker (2 wheels) Transfers: Sit to/from Stand Sit to Stand: Min assist           General transfer comment: VC for hand placement     Balance                                           ADL either performed or assessed with clinical judgement   ADL Overall ADL's : Needs assistance/impaired Eating/Feeding: Sitting;Set up   Grooming: Sitting;Set  up                   Toilet Transfer: Contact guard assist;BSC/3in1;Rolling walker (2 wheels);Minimal assistance Toilet Transfer Details (indicate cue type and reason): VC sequencing Toileting- Clothing Manipulation and Hygiene: Set up;Contact guard assist;Sit to/from stand       Functional mobility during ADLs: Contact guard assist;Minimal assistance;Cueing for safety;Cueing for sequencing;Rolling walker (2 wheels)      Extremity/Trunk Assessment              Vision       Perception     Praxis      Cognition Arousal: Alert Behavior During Therapy: WFL for tasks assessed/performed Overall Cognitive Status: No family/caregiver present to determine baseline cognitive functioning                                          Exercises      Shoulder Instructions       General Comments      Pertinent Vitals/ Pain       Pain Assessment Pain Assessment: No/denies pain  Home Living  Prior Functioning/Environment              Frequency  Min 1X/week        Progress Toward Goals  OT Goals(current goals can now be found in the care plan section)  Progress towards OT goals: Progressing toward goals  Acute Rehab OT Goals Patient Stated Goal: return to PLOF OT Goal Formulation: With patient Time For Goal Achievement: 12/14/22 Potential to Achieve Goals: Good  Plan      Co-evaluation                 AM-PAC OT "6 Clicks" Daily Activity     Outcome Measure   Help from another person eating meals?: None Help from another person taking care of personal grooming?: A Little Help from another person toileting, which includes using toliet, bedpan, or urinal?: A Little Help from another person bathing (including washing, rinsing, drying)?: A Lot Help from another person to put on and taking off regular upper body clothing?: A Little Help from another person to put on and  taking off regular lower body clothing?: A Lot 6 Click Score: 17    End of Session Equipment Utilized During Treatment: Rolling walker (2 wheels)  OT Visit Diagnosis: Other abnormalities of gait and mobility (R26.89)   Activity Tolerance Patient tolerated treatment well   Patient Left in chair;with call bell/phone within reach;with chair alarm set   Nurse Communication Mobility status        Time: [phone] OT Time Calculation (min): 33 min  Charges: OT General Charges $OT Visit: 1 Visit OT Treatments $Self Care/Home Management : 23-37 mins  Arman Filter., MPH, MS, OTR/L ascom [phone] 12/03/22, 11:16 AM

## 2022-12-03 NOTE — Progress Notes (Signed)
Physical Therapy Treatment Patient Details Name: Kristie Hoover MRN: [id number] DOB: 04/28/1950 Today's Date: 12/03/2022   History of Present Illness Pt is a 72 y.o. female with a PMHx of diabetes mellitus type 2, GERD, hiatal hernia, hypercholesterolemia, hypertension, degenerative disc disease, asthma, history of memory impairment and delusional disorder, who was admitted to [facility] on 11/28/2022 for evaluation of significant hypoglycemia, COVID-19 infection, acute kidney injury.    PT Comments  Pt received in recliner, requesting to use bathroom. ModA for sit to stand transfers from recliner, armless chair, and BSC. Attempted gait progression in room, pt at risk for urine incontinence requiring BSC to be brought to pt. Very unsteady in standing, ModA provided due to high fall risk, staggering L<>R at times with NBOS. Pt overall pleasant and cooperative. Will continue to benefit from acute PT services, recommendations remain appropriate.    If plan is discharge home, recommend the following: A lot of help with bathing/dressing/bathroom;A lot of help with walking and/or transfers;Assist for transportation;Assistance with cooking/housework;Help with stairs or ramp for entrance   Can travel by private vehicle     No  Equipment Recommendations  Other (comment) (TBD at next level of care)    Recommendations for Other Services       Precautions / Restrictions Precautions Precautions: Fall Restrictions Weight Bearing Restrictions: No     Mobility  Bed Mobility               General bed mobility comments:  (Pt in recliner pre/post session)    Transfers Overall transfer level: Needs assistance Equipment used: Rolling walker (2 wheels) Transfers: Sit to/from Stand Sit to Stand: Mod assist (from low chair without arm rests)           General transfer comment: VC for hand placement    Ambulation/Gait Ambulation/Gait assistance: Min assist, Mod assist Gait Distance (Feet):   (12) Assistive device: Rolling walker (2 wheels) Gait Pattern/deviations: Step-to pattern, Narrow base of support, Staggering left, Staggering right Gait velocity: decreased     General Gait Details: 12   Stairs             Wheelchair Mobility     Tilt Bed    Modified Rankin (Stroke Patients Only)       Balance Overall balance assessment: Needs assistance Sitting-balance support: No upper extremity supported, Feet supported Sitting balance-Leahy Scale: Fair     Standing balance support: Bilateral upper extremity supported, During functional activity, Reliant on assistive device for balance Standing balance-Leahy Scale: Poor Standing balance comment:  (High risk for falls, very unsteady, at risk for buckling)                            Cognition Arousal: Alert Behavior During Therapy: WFL for tasks assessed/performed Overall Cognitive Status: No family/caregiver present to determine baseline cognitive functioning Area of Impairment: Orientation, Memory, Following commands, Problem solving                 Orientation Level: Disoriented to, Place, Time   Memory: Decreased recall of precautions, Decreased short-term memory Following Commands: Follows one step commands consistently     Problem Solving: Slow processing, Requires verbal cues          Exercises      General Comments General comments (skin integrity, edema, etc.): Pt remains pleasantly confused and cooperative during session.      Pertinent Vitals/Pain Pain Assessment Pain Assessment: No/denies pain    Home  Living                          Prior Function            PT Goals (current goals can now be found in the care plan section) Acute Rehab PT Goals Patient Stated Goal: to feel better Progress towards PT goals: Progressing toward goals    Frequency    Min 1X/week      PT Plan      Co-evaluation              AM-PAC PT "6 Clicks"  Mobility   Outcome Measure  Help needed turning from your back to your side while in a flat bed without using bedrails?: A Little Help needed moving from lying on your back to sitting on the side of a flat bed without using bedrails?: A Little Help needed moving to and from a bed to a chair (including a wheelchair)?: A Little Help needed standing up from a chair using your arms (e.g., wheelchair or bedside chair)?: A Lot Help needed to walk in hospital room?: A Lot Help needed climbing 3-5 steps with a railing? : A Lot 6 Click Score: 15    End of Session   Activity Tolerance: Patient tolerated treatment well Patient left: in chair;with call bell/phone within reach;with chair alarm set;with nursing/sitter in room Nurse Communication: Mobility status PT Visit Diagnosis: Other abnormalities of gait and mobility (R26.89);Muscle weakness (generalized) (M62.81);Difficulty in walking, not elsewhere classified (R26.2)     Time: 9147-8295 PT Time Calculation (min) (ACUTE ONLY): 32 min  Charges:    $Gait Training: 8-22 mins $Therapeutic Activity: 8-22 mins PT General Charges $$ ACUTE PT VISIT: 1 Visit                    Zadie Cleverly, PTA  Jannet Askew 12/03/2022, 12:24 PM

## 2022-12-04 DIAGNOSIS — E11641 Type 2 diabetes mellitus with hypoglycemia with coma: Secondary | ICD-10-CM | POA: Diagnosis not present

## 2022-12-04 LAB — GLUCOSE, CAPILLARY
Glucose-Capillary: 119 mg/dL — ABNORMAL HIGH (ref 70–99)
Glucose-Capillary: 115 mg/dL — ABNORMAL HIGH (ref 70–99)
Glucose-Capillary: 152 mg/dL — ABNORMAL HIGH (ref 70–99)
Glucose-Capillary: 125 mg/dL — ABNORMAL HIGH (ref 70–99)

## 2022-12-04 LAB — BASIC METABOLIC PANEL
Anion gap: 9 (ref 5–15)
BUN: 17 mg/dL (ref 8–23)
CO2: 23 mmol/L (ref 22–32)
Calcium: 8.4 mg/dL — ABNORMAL LOW (ref 8.9–10.3)
Chloride: 114 mmol/L — ABNORMAL HIGH (ref 98–111)
Creatinine, Ser: 0.96 mg/dL (ref 0.44–1.00)
GFR, Estimated: >60 mL/min (ref >60)
Glucose, Bld: 128 mg/dL — ABNORMAL HIGH (ref 70–99)
Potassium: 3.6 mmol/L (ref 3.5–5.1)
Sodium: 146 mmol/L — ABNORMAL HIGH (ref 135–145)

## 2022-12-04 MED ORDER — AMLODIPINE BESYLATE 5 MG PO TABS
5.0000 mg | ORAL_TABLET | Freq: Once | ORAL | Status: AC
  Administered 2022-12-04: 5 mg via ORAL
  Filled 2022-12-04: qty 1

## 2022-12-04 MED ORDER — AMLODIPINE BESYLATE 5 MG PO TABS
5.0000 mg | ORAL_TABLET | Freq: Every day | ORAL | Status: DC
  Administered 2022-12-05 – 2022-12-10 (×6): 5 mg via ORAL
  Filled 2022-12-04 (×6): qty 1

## 2022-12-04 NOTE — Progress Notes (Signed)
  [facility]  ROUNDING NOTE   Subjective:   UOP 9 occurrences, Foley catheter removed  Creatinine 0.96   Assessment/ Plan:  72 y.o. female Kristie Hoover is a 72 y.o. female with diabetes mellitus type II, GERD, hyperlipidemia, hypertension, degenerative disc disease, asthma, and dementia who is admitted to [facility] on 11/28/2022 for Hypokalemia [E87.6] Acute encephalopathy [G93.40] Acute renal failure, unspecified acute renal failure type (HCC) [N17.9] Uncontrolled diabetes mellitus with hypoglycemic coma, without long-term current use of insulin (HCC) [E11.641] COVID-19 [U07.1]   1.  Acute kidney injury most likely secondary to acute tubular necrosis.  Baseline creatinine at 0.89 with normal GFR on 10/31/2022.  No obstruction. No IV contrast exposure.  - Creatinine has returned to baseline  2.  Hypokalemia.  Improved. Secondary to furosemide.  - home regimen includes potassium chloride.   3.  Posterior bladder mass.  Noted on renal ultrasound.  Urology has seen the patient and recommends outpatient follow up.   4. Hypertension: blood pressure acceptable - on amlodipine and hydralazine.   5. Diabetes mellitus type II with renal manifestations - holding glipizide and metformin - Well controlled   Due to renal recovery, we will sign off at this time. Thank you for including [place] in the treatment team.    LOS: 6   9/17/202412:03 PM

## 2022-12-04 NOTE — TOC PASRR Note (Signed)
RE: Kristie Hoover Date of Birth: February 01, 1951 Date: 12/04/2022     To Whom It May Concern:   Please be advised that the above-named patient will require a short-term nursing home stay - anticipated 30 days or less for rehabilitation and strengthening.  The plan is for return home

## 2022-12-04 NOTE — Plan of Care (Signed)

## 2022-12-04 NOTE — Consult Note (Signed)
PHARMACY CONSULT NOTE - ELECTROLYTES  Pharmacy Consult for Electrolyte Monitoring and Replacement   Recent Labs: Potassium (mmol/L)  Date Value  12/04/2022 3.6   Magnesium (mg/dL)  Date Value  40/98/1191 1.7   Calcium (mg/dL)  Date Value  47/82/9562 8.4 (L)   Albumin (g/dL)  Date Value  13/10/6576 2.0 (L)   Phosphorus (mg/dL)  Date Value  46/96/2952 4.0   Sodium (mmol/L)  Date Value  12/04/2022 146 (H)   Height: 5\' 6"  (167.6 cm) Weight: 68.4 kg (150 lb 12.7 oz) IBW/kg (Calculated) : 59.3 Estimated Creatinine Clearance: 48.1 mL/min (by C-G formula based on SCr of 0.96 mg/dL).  Assessment  Kristie Hoover is a 72 y.o. female presenting with AKI and bladder mass. PMH significant for DM, HTN, HLD, memory impairment. Pharmacy has been consulted to monitor and replace electrolytes.  Diet: Regular MIVF: NS @ 75 mL/hr Pertinent medications: N/A  Goal of Therapy: Electrolytes within normal limits  Plan:  No electrolyte replacement indicated at this time Follow-up electrolytes as ordered by primary team and replacement when indicated  Thank you for allowing pharmacy to be a part of this patient's care.  Jaynie Bream, PharmD Candidate 2025  12/04/2022 7:53 AM

## 2022-12-04 NOTE — Progress Notes (Signed)
Nutrition Follow-up  DOCUMENTATION CODES:   Non-severe (moderate) malnutrition in context of social or environmental circumstances  INTERVENTION:   -Continue Ensure Enlive po TID, each supplement provides 350 kcal and 20 grams of protein.  -Continue Magic cup TID with meals, each supplement provides 290 kcal and 9 grams of protein  -Continue MVI daily -Continue liberalized diet to regular diet  NUTRITION DIAGNOSIS:   Moderate Malnutrition related to social / environmental circumstances as evidenced by moderate fat depletion, moderate muscle depletion.  Ongoing  GOAL:   Patient will meet greater than or equal to 90% of their needs  Progressing   MONITOR:   PO intake, Supplement acceptance, Labs, Weight trends, Skin, I & O's  REASON FOR ASSESSMENT:   Consult Assessment of nutrition requirement/status  ASSESSMENT:   72 y/o female with h/o DM, hypertension, hyperlipidemia, asthma, DDD, GERD and recent diagnosis of memory impairment and delusional disorder who is admitted with COVID 19, AKI and new bladder mass.  Reviewed I/O's: +1.7 L x 24 hours and +4.3 L since admission   Pt resting at time of visit. She did not respond to voice and family present. Noted breakfast tray- pt consumed 75% of meal and 100% of Ensure supplement.   Wt has been stable since admission.   Per TOC notes, plan SNF once medically stable.   Medications reviewed.   Labs reviewed: Na: 146, CBGS: 114-150 (inpatient orders for glycemic control are none).    Diet Order:   Diet Order             Diet regular Room service appropriate? Yes; Fluid consistency: Thin  Diet effective now                   EDUCATION NEEDS:   Education needs have been addressed  Skin:  Skin Assessment: Reviewed RN Assessment  Last BM:  12/03/22 (type 5)  Height:   Ht Readings from Last 1 Encounters:  12/01/22 5\' 6"  (1.676 m)    Weight:   Wt Readings from Last 1 Encounters:  12/04/22 68.4 kg     Ideal Body Weight:  61.36 kg  BMI:  Body mass index is 24.34 kg/m.  Estimated Nutritional Needs:   Kcal:  1850-2050  Protein:  90-105 grams  Fluid:  > 1.8 L    Levada Schilling, RD, LDN, CDCES Registered Dietitian II Certified Diabetes Care and Education Specialist Please refer to [facility] for RD and/or RD on-call/weekend/after hours pager

## 2022-12-04 NOTE — NC FL2 (Signed)
[place] MEDICAID FL2 LEVEL OF CARE FORM     IDENTIFICATION  Patient Name: Kristie Hoover Birthdate: 1950/05/25 Sex: female Admission Date (Current Location): 11/28/2022  [facility] and [place] Number:  Chiropodist and Address:  [facility], [address] [place], [place] [address]      Provider Number: [id number]  Attending Physician Name and Address:  Lurene Shadow, MD  Relative Name and Phone Number:  Garwin Brothers (Niece)  [phone]    Current Level of Care: Hospital Recommended Level of Care: Skilled Nursing Facility Prior Approval Number:    Date Approved/Denied:   PASRR Number: Pending  Discharge Plan: SNF    Current Diagnoses: Patient Active Problem List   Diagnosis Date Noted   Malnutrition of moderate degree 11/29/2022   HTN (hypertension) 11/28/2022   Asthma 11/28/2022   Uncontrolled diabetes mellitus with hypoglycemic coma, without long-term current use of insulin (HCC) 11/28/2022   AKI (acute kidney injury) (HCC) 11/28/2022   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   Dementia without behavioral disturbance (HCC) 11/28/2022   High anion gap metabolic acidosis 11/28/2022   Hypokalemia 11/28/2022   Hypocalcemia 11/28/2022   Anemia 11/28/2022   COVID-19 virus infection 11/28/2022   Hypercholesterolemia    Acute confusional state 10/31/2022   Auditory hallucinations 10/31/2022   Status post reverse total shoulder replacement, 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   Status post reverse arthroplasty of shoulder, 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   S/P anterior colporrhaphy 04/18/2020   Rectocele    Cystocele, midline 02/15/2020    Orientation RESPIRATION BLADDER Height & Weight     Self  Normal Continent Weight: 150 lb 12.7 oz (68.4 kg) Height:  5\' 6"  (167.6 cm)  BEHAVIORAL SYMPTOMS/MOOD NEUROLOGICAL BOWEL NUTRITION STATUS      Continent    AMBULATORY STATUS COMMUNICATION OF NEEDS Skin   Extensive Assist Verbally Normal                        Personal Care Assistance Level of Assistance  Bathing, Feeding, Dressing Bathing Assistance: Maximum assistance Feeding assistance: Limited assistance Dressing Assistance: Maximum assistance Total Care Assistance: Maximum assistance   Functional Limitations Info  Hearing, Speech, Sight Sight Info: Adequate Hearing Info: Adequate Speech Info: Adequate    SPECIAL CARE FACTORS FREQUENCY  PT (By licensed PT), OT (By licensed OT)     PT Frequency: 5 times a week OT Frequency: 5 times a week            Contractures Contractures Info: Not present    Additional Factors Info  Code Status, Allergies, Isolation Precautions Code Status Info: FULL Allergies Info: Oxycodone-acetaminophen  Prednisone  Tramadol  Chlorhexidine Gluconate  Erythromycin     Isolation Precautions Info: COVID     Current Medications (12/04/2022):  This is the current hospital active medication list Current Facility-Administered Medications  Medication Dose Route Frequency Provider Last Rate Last Admin   acetaminophen (TYLENOL) tablet 650 mg  650 mg Oral Q6H PRN Lurene Shadow, MD       Or   acetaminophen (TYLENOL) suppository 650 mg  650 mg Rectal Q6H PRN Lurene Shadow, MD       amLODipine (NORVASC) tablet 2.5 mg  2.5 mg Oral Daily Lurene Shadow, MD   2.5 mg at 12/04/22 0811   dextrose 50 % solution 50 mL  1 ampule Intravenous PRN Lurene Shadow, MD   25 mL at 11/29/22 0407   feeding supplement (ENSURE ENLIVE / ENSURE PLUS) liquid 237 mL  237 mL Oral  TID BM Lurene Shadow, MD   237 mL at 12/04/22 0812   heparin injection 5,000 Units  5,000 Units Subcutaneous Q8H Lurene Shadow, MD   5,000 Units at 12/04/22 0530   hydrALAZINE (APRESOLINE) tablet 10 mg  10 mg Oral Q8H Lurene Shadow, MD   10 mg at 12/04/22 0530   hydrALAZINE (APRESOLINE) tablet 25 mg  25 mg Oral Q8H PRN Lurene Shadow, MD       HYDROcodone-acetaminophen (NORCO/VICODIN) 5-325 MG per tablet 1-2 tablet  1-2 tablet Oral Q4H PRN Lurene Shadow, MD   2 tablet at 12/02/22 2003   multivitamin with minerals tablet 1 tablet  1 tablet Oral Daily Lurene Shadow, MD   1 tablet at 12/04/22 0812   ondansetron (ZOFRAN) tablet 4 mg  4 mg Oral Q6H PRN Lurene Shadow, MD       Or   ondansetron [facility]) injection 4 mg  4 mg Intravenous Q6H PRN Lurene Shadow, MD   4 mg at 11/29/22 2031   Oral care mouth rinse  15 mL Mouth Rinse PRN Lurene Shadow, MD         Discharge Medications: Please see discharge summary for a list of discharge medications.  Relevant Imaging Results:  Relevant Lab Results:   Additional Information [id number]  Allena Katz, LCSW

## 2022-12-04 NOTE — Progress Notes (Signed)
Progress Note    Kristie Hoover  ZOX:[id number] DOB: 01-06-1951  DOA: 11/28/2022 PCP: [facility]      Brief Narrative:    Medical records reviewed and are as summarized below:  Kristie Hoover is a 72 y.o. female with medical history significant for DM, HTN, HLD, recently evaluated by neurologist Dr. Malvin Johns on 11/22/2022 with a new diagnosis of memory impairment and delusional disorder after being evaluated for for 59-month history of progressive memory loss, delusions about property being stolen and concern for medication compliance.  She was started on risperidone. She was also seen in the emergency department by psychiatrist in August 2024 for confusion, paranoia, hearing voices and poor memory. She presented to the emergency department after she was found unresponsive at home.  Family had been trying to get in touch with her without any response.  When EMS arrived, she was given Narcan without any response.  She was then found to have a blood glucose level of 23.  She was given D10 with improvement in her glucose by the time she arrived in the emergency department.   In the ED, she was found to have AKI with creatinine of 6.56, hypokalemia with potassium of 2.5, hypomagnesemia with magnesium of 1.0.  Glucose level dropped again from 107-23 in the emergency department.  She had recurrent hypoglycemic episodes in the ED.  She was started on 10% dextrose infusion for recurrent hypoglycemia and IV normal saline infusion for AKI.  Renal ultrasound also revealed a large bladder mass.        Assessment/Plan:   Principal Problem:   Uncontrolled diabetes mellitus with hypoglycemic coma, without long-term current use of insulin (HCC) Active Problems:   AKI (acute kidney injury) (HCC)   High anion gap metabolic acidosis   Hypokalemia   COVID-19 virus infection   Auditory hallucinations   Delusional disorder (HCC)   Hypocalcemia   HTN (hypertension)   Dementia without  behavioral disturbance (HCC)   Asthma   Hypercholesterolemia   Anemia   Malnutrition of moderate degree    Body mass index is 24.34 kg/m.   Type II DM with recurrent hypoglycemia: Glucose levels have stabilized.  off of IV 10% dextrose infusion.  Probably better to discontinue glipizide-metformin at discharge and control diabetes with diet for now. Hemoglobin A1c was 6.   Acute kidney injury: Resolved. Creatinine is down from 6.95-6.15-4.20-2.55-1.31-0.96.    Hypokalemia, hypomagnesemia and hypocalcemia: Improved   Large bladder mass: Outpatient follow-up with urologist for cystoscopy.    Acute metabolic encephalopathy: Patient appears to be back to her baseline mental status but she has underlying dementia.   Auditory hallucinations, delusional disorder, dementia with behavioral disturbance: Continue supportive care.   Hypertension: Continue amlodipine and hydralazine.  Increase amlodipine from 2.5 mg to 5 mg daily.   COVID-19 infection: Asymptomatic.  She is tolerating room air.     General Weakness: PT recommended discharge to SNF.  Follow-up with social worker to assist with disposition.   Other comorbidities include hypertension, asthma, hypercholesterolemia, chronic anemia      Diet Order             Diet regular Room service appropriate? Yes; Fluid consistency: Thin  Diet effective now                            Consultants: Nephrologist Urologist  Procedures: None    Medications:    [START ON 12/05/2022] amLODipine  5 mg Oral Daily   amLODipine  5 mg Oral Once   feeding supplement  237 mL Oral TID BM   heparin injection (subcutaneous)  5,000 Units Subcutaneous Q8H   hydrALAZINE  10 mg Oral Q8H   multivitamin with minerals  1 tablet Oral Daily   Continuous Infusions:     Anti-infectives (From admission, onward)    None              Family Communication/Anticipated D/C date and plan/Code Status   DVT  prophylaxis: heparin injection 5,000 Units Start: 11/28/22 2200     Code Status: Full Code  Family Communication: None Disposition Plan: Plan to discharge to SNF   Status is: Inpatient Remains inpatient appropriate because: awaiting placement to SNF       Subjective:   Interval events noted.  She has no complaints.  No shortness of breath, chest pain, dizziness or headache.  Objective:    Vitals:   12/03/22 1652 12/03/22 1952 12/04/22 0529 12/04/22 0739  BP: (!) 182/78 (!) 173/72 (!) 175/73 (!) 183/76  Pulse:  74 76 78  Resp:  19 18 16   Temp:  98.6 F (37 C) 97.9 F (36.6 C) 98.1 F (36.7 C)  TempSrc:  Oral Oral   SpO2:  100% 99% 99%  Weight:   68.4 kg   Height:       No data found.   Intake/Output Summary (Last 24 hours) at 12/04/2022 1203 Last data filed at 12/03/2022 1236 Gross per 24 hour  Intake 214.04 ml  Output --  Net 214.04 ml   Filed Weights   12/02/22 0500 12/03/22 0500 12/04/22 0529  Weight: 67 kg 66.4 kg 68.4 kg    Exam:   GEN: NAD SKIN: Warm and dry EYES: No pallor or icterus ENT: MMM CV: RRR PULM: CTA B ABD: soft, ND, NT, +BS CNS: AAO x 1, non focal EXT: No edema or tenderness     Data Reviewed:   I have personally reviewed following labs and imaging studies:  Labs: Labs show the following:   Basic Metabolic Panel: Recent Labs  Lab 11/28/22 2123 11/29/22 0020 11/29/22 0514 11/30/22 0040 11/30/22 0601 12/01/22 0538 12/02/22 0337 12/03/22 0833 12/04/22 0604  NA  --    < > 139   < > 138 139 141 145 146*  K  --    < > 3.2*   < > 4.0 3.8 3.3* 3.6 3.6  CL  --    < > 100   < > 102 108 110 116* 114*  CO2  --    < > 21*   < > 21* 20* 20* 21* 23  GLUCOSE  --    < > 131*   < > 157* 150* 124* 130* 128*  BUN  --    < > 41*   < > 46* 53* 46* 26* 17  CREATININE  --    < > 6.82*   < > 6.15* 4.20* 2.55* 1.31* 0.96  CALCIUM  --    < > 7.4*   < > 8.0* 8.4* 8.3* 8.3* 8.4*  MG  --   --  3.9*  --  3.1* 2.4 2.0 1.7  --   PHOS 5.4*   --  4.0  --  4.2 4.3 4.0  --   --    < > = values in this interval not displayed.   GFR Estimated Creatinine Clearance: 48.1 mL/min (by C-G formula based on SCr of 0.96 mg/dL).  Liver Function Tests: Recent Labs  Lab 11/28/22 1803 11/30/22 0601 12/01/22 0538 12/02/22 0337  AST 21  --   --   --   ALT 11  --   --   --   ALKPHOS 87  --   --   --   BILITOT 1.1  --   --   --   PROT 6.8  --   --   --   ALBUMIN 2.3* 2.2* 1.8* 2.0*   No results for input(s): "LIPASE", "AMYLASE" in the last 168 hours. Recent Labs  Lab 11/28/22 1812  AMMONIA 15   Coagulation profile No results for input(s): "INR", "PROTIME" in the last 168 hours.  CBC: Recent Labs  Lab 11/28/22 1803 11/29/22 0514 11/30/22 0601 12/02/22 0337  WBC 13.9* 14.7* 11.9* 8.5  NEUTROABS 11.9*  --  9.5*  --   HGB 11.5* 11.4* 11.9* 10.8*  HCT 34.2* 33.6* 35.2* 31.6*  MCV 84.7 84.4 83.2 82.7  PLT 279 311 309 297   Cardiac Enzymes: Recent Labs  Lab 11/30/22 0040  CKTOTAL 161   BNP (last 3 results) No results for input(s): "PROBNP" in the last 8760 hours. CBG: Recent Labs  Lab 12/03/22 1132 12/03/22 1645 12/03/22 1953 12/04/22 0733 12/04/22 1148  GLUCAP 150* 121* 125* 119* 115*   D-Dimer: No results for input(s): "DDIMER" in the last 72 hours. Hgb A1c: No results for input(s): "HGBA1C" in the last 72 hours.  Lipid Profile: No results for input(s): "CHOL", "HDL", "LDLCALC", "TRIG", "CHOLHDL", "LDLDIRECT" in the last 72 hours. Thyroid function studies: No results for input(s): "TSH", "T4TOTAL", "T3FREE", "THYROIDAB" in the last 72 hours.  Invalid input(s): "FREET3" Anemia work up: No results for input(s): "VITAMINB12", "FOLATE", "FERRITIN", "TIBC", "IRON", "RETICCTPCT" in the last 72 hours. Sepsis Labs: Recent Labs  Lab 11/28/22 1803 11/28/22 1915 11/29/22 0514 11/30/22 0601 12/02/22 0337  WBC 13.9*  --  14.7* 11.9* 8.5  LATICACIDVEN  --  1.3  --   --   --     Microbiology Recent Results  (from the past 240 hour(s))  Resp panel by RT-PCR (RSV, Flu A&B, Covid) Anterior Nasal Swab     Status: Abnormal   Collection Time: 11/28/22  6:12 PM   Specimen: Anterior Nasal Swab  Result Value Ref Range Status   SARS Coronavirus 2 by RT PCR POSITIVE (A) NEGATIVE Final    Comment: (NOTE) SARS-CoV-2 target nucleic acids are DETECTED.  The SARS-CoV-2 RNA is generally detectable in upper respiratory specimens during the acute phase of infection. Positive results are indicative of the presence of the identified virus, but do not rule out bacterial infection or co-infection with other pathogens not detected by the test. Clinical correlation with patient history and other diagnostic information is necessary to determine patient infection status. The expected result is Negative.  Fact Sheet for Patients: BloggerCourse.com  Fact Sheet for Healthcare Providers: SeriousBroker.it  This test is not yet approved or cleared by the [place] FDA and  has been authorized for detection and/or diagnosis of SARS-CoV-2 by FDA under an Emergency Use Authorization (EUA).  This EUA will remain in effect (meaning this test can be used) for the duration of  the COVID-19 declaration under Section 564(b)(1) of the A ct, 21 U.S.C. section 360bbb-3(b)(1), unless the authorization is terminated or revoked sooner.     Influenza A by PCR NEGATIVE NEGATIVE Final   Influenza B by PCR NEGATIVE NEGATIVE Final    Comment: (NOTE) The Xpert Xpress SARS-CoV-2/FLU/RSV plus assay  is intended as an aid in the diagnosis of influenza from Nasopharyngeal swab specimens and should not be used as a sole basis for treatment. Nasal washings and aspirates are unacceptable for Xpert Xpress SARS-CoV-2/FLU/RSV testing.  Fact Sheet for Patients: BloggerCourse.com  Fact Sheet for Healthcare  Providers: SeriousBroker.it  This test is not yet approved or cleared by the [place] FDA and has been authorized for detection and/or diagnosis of SARS-CoV-2 by FDA under an Emergency Use Authorization (EUA). This EUA will remain in effect (meaning this test can be used) for the duration of the COVID-19 declaration under Section 564(b)(1) of the Act, 21 U.S.C. section 360bbb-3(b)(1), unless the authorization is terminated or revoked.     Resp Syncytial Virus by PCR NEGATIVE NEGATIVE Final    Comment: (NOTE) Fact Sheet for Patients: BloggerCourse.com  Fact Sheet for Healthcare Providers: SeriousBroker.it  This test is not yet approved or cleared by the [place] FDA and has been authorized for detection and/or diagnosis of SARS-CoV-2 by FDA under an Emergency Use Authorization (EUA). This EUA will remain in effect (meaning this test can be used) for the duration of the COVID-19 declaration under Section 564(b)(1) of the Act, 21 U.S.C. section 360bbb-3(b)(1), unless the authorization is terminated or revoked.  Performed at [facility], [address] [place], [place] [address]   Urine Culture     Status: None   Collection Time: 11/28/22  6:39 PM   Specimen: Urine, Random  Result Value Ref Range Status   Specimen Description   Final    URINE, RANDOM Performed at [facility], [address] [place], [place] [address]    Special Requests   Final    NONE Reflexed from [phone] Performed at [facility], [address] [place], [place] [address]    Culture   Final    NO GROWTH Performed at [facility], [address], [place], [place] [address]    Report Status 11/30/2022 FINAL  Final  MRSA Next Gen by PCR, Nasal     Status: None   Collection Time: 11/28/22 10:28 PM   Specimen: Nasal Mucosa; Nasal Swab  Result Value Ref Range Status   MRSA by PCR  Next Gen NOT DETECTED NOT DETECTED Final    Comment: (NOTE) The GeneXpert MRSA Assay (FDA approved for NASAL specimens only), is one component of a comprehensive MRSA colonization surveillance program. It is not intended to diagnose MRSA infection nor to guide or monitor treatment for MRSA infections. Test performance is not FDA approved in patients less than 71 years old. Performed at [facility], [address] [place], [place] [address]     Procedures and diagnostic studies:  No results found.             LOS: 6 days   Christopher Hink  [facility]   Pager on [email or web address]. If 7PM-7AM, please contact night-coverage at [email or web address]     12/04/2022, 12:03 PM

## 2022-12-04 NOTE — TOC Progression Note (Signed)
Transition of Care [facility]) - Progression Note    Patient Details  Name: Kristie Hoover MRN: [id number] Date of Birth: 04/22/1950  Transition of Care [facility]) CM/SW Contact  Allena Katz, LCSW Phone Number: 12/04/2022, 3:23 PM  Clinical Narrative:   Referrals sent out for rehab. Documentation uploaded to [facility] for level 2 Passr.     Expected Discharge Plan: Skilled Nursing Facility Barriers to Discharge: Continued Medical Work up  Expected Discharge Plan and Services                                               Social Determinants of Health (SDOH) Interventions SDOH Screenings   Food Insecurity: Patient Unable To Answer (11/28/2022)  Housing: Patient Unable To Answer (11/28/2022)  Transportation Needs: Patient Unable To Answer (11/28/2022)  Utilities: Patient Unable To Answer (11/28/2022)  Financial Resource Strain: Low Risk  (11/22/2022)   Received from [facility]  Tobacco Use: Low Risk  (12/03/2022)    Readmission Risk Interventions     No data to display

## 2022-12-05 DIAGNOSIS — E11641 Type 2 diabetes mellitus with hypoglycemia with coma: Secondary | ICD-10-CM | POA: Diagnosis not present

## 2022-12-05 LAB — GLUCOSE, CAPILLARY
Glucose-Capillary: 133 mg/dL — ABNORMAL HIGH (ref 70–99)
Glucose-Capillary: 133 mg/dL — ABNORMAL HIGH (ref 70–99)
Glucose-Capillary: 135 mg/dL — ABNORMAL HIGH (ref 70–99)
Glucose-Capillary: 150 mg/dL — ABNORMAL HIGH (ref 70–99)
Glucose-Capillary: 165 mg/dL — ABNORMAL HIGH (ref 70–99)

## 2022-12-05 MED ORDER — POTASSIUM CHLORIDE 20 MEQ PO PACK
40.0000 meq | PACK | Freq: Once | ORAL | Status: DC
  Filled 2022-12-05: qty 2

## 2022-12-05 MED ORDER — POTASSIUM CHLORIDE CRYS ER 20 MEQ PO TBCR
40.0000 meq | EXTENDED_RELEASE_TABLET | Freq: Once | ORAL | Status: AC
  Administered 2022-12-05: 40 meq via ORAL
  Filled 2022-12-05: qty 2

## 2022-12-05 MED ORDER — POTASSIUM CHLORIDE 10 MEQ/100ML IV SOLN
10.0000 meq | INTRAVENOUS | Status: AC
  Administered 2022-12-05 (×2): 10 meq via INTRAVENOUS
  Filled 2022-12-05: qty 100

## 2022-12-05 MED ORDER — MIRTAZAPINE 15 MG PO TABS
15.0000 mg | ORAL_TABLET | Freq: Every day | ORAL | Status: DC
  Administered 2022-12-05 – 2022-12-09 (×5): 15 mg via ORAL
  Filled 2022-12-05 (×5): qty 1

## 2022-12-05 NOTE — TOC Progression Note (Addendum)
Transition of Care [facility]) - Progression Note    Patient Details  Name: Kristie Hoover MRN: [id number] Date of Birth: 04/14/1950  Transition of Care [facility]) CM/SW Contact  Allena Katz, LCSW Phone Number: 12/05/2022, 9:26 AM  Clinical Narrative:   [name] [phone]    Expected Discharge Plan: Skilled Nursing Facility Barriers to Discharge: Continued Medical Work up  Expected Discharge Plan and Services                                               Social Determinants of Health (SDOH) Interventions SDOH Screenings   Food Insecurity: Patient Unable To Answer (11/28/2022)  Housing: Patient Unable To Answer (11/28/2022)  Transportation Needs: Patient Unable To Answer (11/28/2022)  Utilities: Patient Unable To Answer (11/28/2022)  Financial Resource Strain: Low Risk  (11/22/2022)   Received from [facility]  Tobacco Use: Low Risk  (12/03/2022)    Readmission Risk Interventions     No data to display

## 2022-12-05 NOTE — Progress Notes (Signed)
Physical Therapy Treatment Patient Details Name: RAILYN BALLADARES MRN: [id number] DOB: 1950/10/10 Today's Date: 12/05/2022   History of Present Illness Pt is a 72 y.o. female with a PMHx of diabetes mellitus type 2, GERD, hiatal hernia, hypercholesterolemia, hypertension, degenerative disc disease, asthma, history of memory impairment and delusional disorder, who was admitted to [facility] on 11/28/2022 for evaluation of significant hypoglycemia, COVID-19 infection, acute kidney injury.    PT Comments  Pt received in Semi-Fowler's position and agreeable to therapy.  Pt pleasantly confused upon arrival noting two people standing on the top of the roof, however it was just radiator fans on the top of the hospital roof.  Pt pleasant throughout and was agreeable to performing exercises prior to ambulating.  Pt then ambulated ~65-70' down hallway before returning for a total of ~130-140'.  Pt left with all needs met in the bed and call bell within reach.  Pt thankful and appreciative of mobilization and getting out of the room.    If plan is discharge home, recommend the following: A lot of help with bathing/dressing/bathroom;A lot of help with walking and/or transfers;Assist for transportation;Assistance with cooking/housework;Help with stairs or ramp for entrance   Can travel by private vehicle     No  Equipment Recommendations  Other (comment)    Recommendations for Other Services       Precautions / Restrictions Precautions Precautions: Fall Restrictions Weight Bearing Restrictions: No     Mobility  Bed Mobility Overal bed mobility: Needs Assistance Bed Mobility: Supine to Sit     Supine to sit: Supervision Sit to supine: Min assist        Transfers Overall transfer level: Needs assistance Equipment used: Rolling walker (2 wheels) Transfers: Sit to/from Stand Sit to Stand: Supervision           General transfer comment: VC for hand placement     Ambulation/Gait Ambulation/Gait assistance: Contact guard assist Gait Distance (Feet): 135 Feet Assistive device: Rolling walker (2 wheels) Gait Pattern/deviations: Step-to pattern, Narrow base of support Gait velocity: decreased         Stairs             Wheelchair Mobility     Tilt Bed    Modified Rankin (Stroke Patients Only)       Balance Overall balance assessment: Needs assistance Sitting-balance support: No upper extremity supported, Feet supported Sitting balance-Leahy Scale: Fair     Standing balance support: Bilateral upper extremity supported, During functional activity, Reliant on assistive device for balance Standing balance-Leahy Scale: Poor Standing balance comment: pt demonstrated improved stability in standing position.                            Cognition Arousal: Alert Behavior During Therapy: WFL for tasks assessed/performed Overall Cognitive Status: No family/caregiver present to determine baseline cognitive functioning Area of Impairment: Orientation, Memory, Following commands, Problem solving                 Orientation Level: Disoriented to, Place, Time   Memory: Decreased recall of precautions, Decreased short-term memory Following Commands: Follows one step commands consistently     Problem Solving: Slow processing, Requires verbal cues          Exercises Total Joint Exercises Ankle Circles/Pumps: AROM, Strengthening, Both, 10 reps, Seated Gluteal Sets: AROM, Strengthening, Both, 10 reps, Seated Long Arc Quad: AROM, Strengthening, Both, 10 reps, Seated Marching in Standing: AROM, Both, 10 reps, Standing  General Comments        Pertinent Vitals/Pain      Home Living                          Prior Function            PT Goals (current goals can now be found in the care plan section) Acute Rehab PT Goals Patient Stated Goal: to feel better PT Goal Formulation: With  patient Time For Goal Achievement: 12/14/22 Potential to Achieve Goals: Good Progress towards PT goals: Progressing toward goals    Frequency    Min 1X/week      PT Plan      Co-evaluation              AM-PAC PT "6 Clicks" Mobility   Outcome Measure  Help needed turning from your back to your side while in a flat bed without using bedrails?: A Little Help needed moving from lying on your back to sitting on the side of a flat bed without using bedrails?: A Little Help needed moving to and from a bed to a chair (including a wheelchair)?: A Little Help needed standing up from a chair using your arms (e.g., wheelchair or bedside chair)?: A Lot Help needed to walk in hospital room?: A Lot Help needed climbing 3-5 steps with a railing? : A Lot 6 Click Score: 15    End of Session   Activity Tolerance: Patient tolerated treatment well Patient left: in chair;with call bell/phone within reach;with chair alarm set;with nursing/sitter in room Nurse Communication: Mobility status PT Visit Diagnosis: Other abnormalities of gait and mobility (R26.89);Muscle weakness (generalized) (M62.81);Difficulty in walking, not elsewhere classified (R26.2)     Time: 8295-6213 PT Time Calculation (min) (ACUTE ONLY): 21 min  Charges:    $Therapeutic Activity: 8-22 mins PT General Charges $$ ACUTE PT VISIT: 1 Visit                     Nolon Bussing, PT, DPT Physical Therapist - [facility]  12/05/22, 2:40 PM

## 2022-12-05 NOTE — Care Management Important Message (Signed)
Important Message  Patient Details  Name: Kristie Hoover MRN: [id number] Date of Birth: Sep 21, 1950   Medicare Important Message Given:  Yes     Olegario Messier A Deshante Cassell 12/05/2022, 1:27 PM

## 2022-12-05 NOTE — Progress Notes (Signed)
PROGRESS NOTE    [name]  WJX:[id number] DOB: [birth year] DOA: 11/28/2022 PCP: [facility]    Brief Narrative:   Kristie Hoover is a 72 y.o. female with medical history significant for DM, HTN, HLD, recently evaluated by neurologist Dr. Malvin Johns on 11/22/2022 with a new diagnosis of memory impairment and delusional disorder after being evaluated for for 65-month history of progressive memory loss, delusions about property being stolen and concern for medication compliance.  She was started on risperidone. She was also seen in the emergency department by psychiatrist in August 2024 for confusion, paranoia, hearing voices and poor memory. She presented to the emergency department after she was found unresponsive at home.  Family had been trying to get in touch with her without any response.  When EMS arrived, she was given Narcan without any response.  She was then found to have a blood glucose level of 23.  She was given D10 with improvement in her glucose by the time she arrived in the emergency department.     In the ED, she was found to have AKI with creatinine of 6.56, hypokalemia with potassium of 2.5, hypomagnesemia with magnesium of 1.0.  Glucose level dropped again from 107-23 in the emergency department.  She had recurrent hypoglycemic episodes in the ED.  She was started on 10% dextrose infusion for recurrent hypoglycemia and IV normal saline infusion for AKI.  Renal ultrasound also revealed a large bladder mass.   Assessment & Plan:   Principal Problem:   Uncontrolled diabetes mellitus with hypoglycemic coma, without long-term current use of insulin (HCC) Active Problems:   AKI (acute kidney injury) (HCC)   High anion gap metabolic acidosis   Hypokalemia   COVID-19 virus infection   Auditory hallucinations   Delusional disorder (HCC)   Hypocalcemia   HTN (hypertension)   Dementia without behavioral disturbance (HCC)   Asthma   Hypercholesterolemia   Anemia    Malnutrition of moderate degree   Type II DM with recurrent hypoglycemia Glucose levels have stabilized.  off of IV 10% dextrose infusion.  Last hemoglobin A1c 6 Plan: Liberalized to regular diet 3 times daily feeding supplements Discontinue glipizide/metformin at discharge Diet controlled diabetes Probably better to discontinue glipizide-metformin at discharge and control diabetes with diet for now. Hemoglobin A1c was 6.     Acute kidney injury: Resolved. Creatinine is down from 6.95-6.15-4.20-2.55-1.31-0.96.      Hypokalemia, hypomagnesemia and hypocalcemia: Likely dietary in nature.  Monitor and replace as necessary     Large bladder mass: Outpatient follow-up with urologist for cystoscopy.      Acute metabolic encephalopathy: Patient appears to be back to her baseline mental status but she has underlying dementia.     Auditory hallucinations, delusional disorder, dementia with behavioral disturbance: Continue supportive care.     Hypertension: Continue amlodipine and hydralazine.  Increase amlodipine from 2.5 mg to 5 mg daily.     COVID-19 infection: Asymptomatic.  She is tolerating room air.       General Weakness: PT recommended discharge to SNF.  Follow-up with social worker to assist with disposition.     Other comorbidities include hypertension, asthma, hypercholesterolemia, chronic anemia   DVT prophylaxis: SQ heparin Code Status: Full Family Communication:Niece [phone] Disposition Plan: Status is: Inpatient Remains inpatient appropriate because: Awaiting placement   Level of care: Med-Surg  Consultants:  None  Procedures:  None  Antimicrobials: None   Subjective: Seen and examined.  No events.  No new complaints  Objective: Vitals:   12/04/22 2033 12/05/22 0447 12/05/22 0614 12/05/22 0733  BP: 126/74 (!) 159/92  (!) 168/85  Pulse: 82 98  83  Resp: 17 18  17   Temp: 98.3 F (36.8 C) 98.7 F (37.1 C)  98.3 F (36.8 C)  TempSrc:       SpO2: 98% 100%  100%  Weight:   64.7 kg   Height:       No intake or output data in the 24 hours ending 12/05/22 1253 Filed Weights   12/03/22 0500 12/04/22 0529 12/05/22 0614  Weight: 66.4 kg 68.4 kg 64.7 kg    Examination:  General exam: Appears calm and comfortable  Respiratory system: Clear to auscultation. Respiratory effort normal. Cardiovascular system: S1-S2, RRR, no murmurs Gastrointestinal system: Soft, NT/ND, normal bowel sounds Central nervous system: Alert.  Oriented x 1.  No focal deficits Extremities: Symmetric 5 x 5 power. Skin: No rashes, lesions or ulcers Psychiatry: Judgement and insight appear impaired. Mood & affect flattened.     Data Reviewed: I have personally reviewed following labs and imaging studies  CBC: Recent Labs  Lab 11/28/22 1803 11/29/22 0514 11/30/22 0601 12/02/22 0337  WBC 13.9* 14.7* 11.9* 8.5  NEUTROABS 11.9*  --  9.5*  --   HGB 11.5* 11.4* 11.9* 10.8*  HCT 34.2* 33.6* 35.2* 31.6*  MCV 84.7 84.4 83.2 82.7  PLT 279 311 309 297   Basic Metabolic Panel: Recent Labs  Lab 11/28/22 2123 11/29/22 0020 11/29/22 0514 11/30/22 0040 11/30/22 0601 12/01/22 0538 12/02/22 0337 12/03/22 0833 12/04/22 0604 12/05/22 0433  NA  --    < > 139   < > 138 139 141 145 146* 145  K  --    < > 3.2*   < > 4.0 3.8 3.3* 3.6 3.6 2.7*  CL  --    < > 100   < > 102 108 110 116* 114* 110  CO2  --    < > 21*   < > 21* 20* 20* 21* 23 26  GLUCOSE  --    < > 131*   < > 157* 150* 124* 130* 128* 120*  BUN  --    < > 41*   < > 46* 53* 46* 26* 17 9  CREATININE  --    < > 6.82*   < > 6.15* 4.20* 2.55* 1.31* 0.96 0.77  CALCIUM  --    < > 7.4*   < > 8.0* 8.4* 8.3* 8.3* 8.4* 7.9*  MG  --   --  3.9*  --  3.1* 2.4 2.0 1.7  --   --   PHOS 5.4*  --  4.0  --  4.2 4.3 4.0  --   --   --    < > = values in this interval not displayed.   GFR: Estimated Creatinine Clearance: 57.8 mL/min (by C-G formula based on SCr of 0.77 mg/dL). Liver Function Tests: Recent Labs   Lab 11/28/22 1803 11/30/22 0601 12/01/22 0538 12/02/22 0337  AST 21  --   --   --   ALT 11  --   --   --   ALKPHOS 87  --   --   --   BILITOT 1.1  --   --   --   PROT 6.8  --   --   --   ALBUMIN 2.3* 2.2* 1.8* 2.0*   No results for input(s): "LIPASE", "AMYLASE" in the last 168 hours. Recent Labs  Lab 11/28/22 1812  AMMONIA 15   Coagulation Profile: No results for input(s): "INR", "PROTIME" in the last 168 hours. Cardiac Enzymes: Recent Labs  Lab 11/30/22 0040  CKTOTAL 161   BNP (last 3 results) No results for input(s): "PROBNP" in the last 8760 hours. HbA1C: No results for input(s): "HGBA1C" in the last 72 hours. CBG: Recent Labs  Lab 12/04/22 1645 12/04/22 2038 12/05/22 0732 12/05/22 1144 12/05/22 1207  GLUCAP 152* 125* 133* 135* 133*   Lipid Profile: No results for input(s): "CHOL", "HDL", "LDLCALC", "TRIG", "CHOLHDL", "LDLDIRECT" in the last 72 hours. Thyroid Function Tests: No results for input(s): "TSH", "T4TOTAL", "FREET4", "T3FREE", "THYROIDAB" in the last 72 hours. Anemia Panel: No results for input(s): "VITAMINB12", "FOLATE", "FERRITIN", "TIBC", "IRON", "RETICCTPCT" in the last 72 hours. Sepsis Labs: Recent Labs  Lab 11/28/22 1915  LATICACIDVEN 1.3    Recent Results (from the past 240 hour(s))  Resp panel by RT-PCR (RSV, Flu A&B, Covid) Anterior Nasal Swab     Status: Abnormal   Collection Time: 11/28/22  6:12 PM   Specimen: Anterior Nasal Swab  Result Value Ref Range Status   SARS Coronavirus 2 by RT PCR POSITIVE (A) NEGATIVE Final    Comment: (NOTE) SARS-CoV-2 target nucleic acids are DETECTED.  The SARS-CoV-2 RNA is generally detectable in upper respiratory specimens during the acute phase of infection. Positive results are indicative of the presence of the identified virus, but do not rule out bacterial infection or co-infection with other pathogens not detected by the test. Clinical correlation with patient history and other  diagnostic information is necessary to determine patient infection status. The expected result is Negative.  Fact Sheet for Patients: BloggerCourse.com  Fact Sheet for Healthcare Providers: SeriousBroker.it  This test is not yet approved or cleared by the [place] FDA and  has been authorized for detection and/or diagnosis of SARS-CoV-2 by FDA under an Emergency Use Authorization (EUA).  This EUA will remain in effect (meaning this test can be used) for the duration of  the COVID-19 declaration under Section 564(b)(1) of the A ct, 21 U.S.C. section 360bbb-3(b)(1), unless the authorization is terminated or revoked sooner.     Influenza A by PCR NEGATIVE NEGATIVE Final   Influenza B by PCR NEGATIVE NEGATIVE Final    Comment: (NOTE) The Xpert Xpress SARS-CoV-2/FLU/RSV plus assay is intended as an aid in the diagnosis of influenza from Nasopharyngeal swab specimens and should not be used as a sole basis for treatment. Nasal washings and aspirates are unacceptable for Xpert Xpress SARS-CoV-2/FLU/RSV testing.  Fact Sheet for Patients: BloggerCourse.com  Fact Sheet for Healthcare Providers: SeriousBroker.it  This test is not yet approved or cleared by the [place] FDA and has been authorized for detection and/or diagnosis of SARS-CoV-2 by FDA under an Emergency Use Authorization (EUA). This EUA will remain in effect (meaning this test can be used) for the duration of the COVID-19 declaration under Section 564(b)(1) of the Act, 21 U.S.C. section 360bbb-3(b)(1), unless the authorization is terminated or revoked.     Resp Syncytial Virus by PCR NEGATIVE NEGATIVE Final    Comment: (NOTE) Fact Sheet for Patients: BloggerCourse.com  Fact Sheet for Healthcare Providers: SeriousBroker.it  This test is not yet approved or  cleared by the [place] FDA and has been authorized for detection and/or diagnosis of SARS-CoV-2 by FDA under an Emergency Use Authorization (EUA). This EUA will remain in effect (meaning this test can be used) for the duration of the COVID-19 declaration under Section  564(b)(1) of the Act, 21 U.S.C. section 360bbb-3(b)(1), unless the authorization is terminated or revoked.  Performed at [facility], [address], [place], [place] [address]   Urine Culture     Status: None   Collection Time: 11/28/22  6:39 PM   Specimen: Urine, Random  Result Value Ref Range Status   Specimen Description   Final    URINE, RANDOM Performed at [facility], [address] [place], [place] [address]    Special Requests   Final    NONE Reflexed from [id number] Performed at [facility], [address], [place], [place] [address]    Culture   Final    NO GROWTH Performed at [facility], [address] [place], [place] [address]    Report Status 11/30/2022 FINAL  Final  MRSA Next Gen by PCR, Nasal     Status: None   Collection Time: 11/28/22 10:28 PM   Specimen: Nasal Mucosa; Nasal Swab  Result Value Ref Range Status   MRSA by PCR Next Gen NOT DETECTED NOT DETECTED Final    Comment: (NOTE) The GeneXpert MRSA Assay (FDA approved for NASAL specimens only), is one component of a comprehensive MRSA colonization surveillance program. It is not intended to diagnose MRSA infection nor to guide or monitor treatment for MRSA infections. Test performance is not FDA approved in patients less than 41 years old. Performed at [facility], [address] [place], [place] [address]          Radiology Studies: No results found.      Scheduled Meds:  amLODipine  5 mg Oral Daily   feeding supplement  237 mL Oral TID BM   heparin injection (subcutaneous)  5,000 Units Subcutaneous Q8H   hydrALAZINE  10 mg Oral Q8H   multivitamin with minerals  1  tablet Oral Daily   Continuous Infusions:   LOS: 7 days    Tresa Moore, MD [facility]   If 7PM-7AM, please contact night-coverage  12/05/2022, 12:53 PM

## 2022-12-05 NOTE — Plan of Care (Signed)
Problem: Education: Goal: Knowledge of General Education information will improve Description: Including pain rating scale, medication(s)/side effects and non-pharmacologic comfort measures Outcome: Progressing   Problem: Health Behavior/Discharge Planning: Goal: Ability to manage health-related needs will improve Outcome: Progressing   Problem: Clinical Measurements: Goal: Ability to maintain clinical measurements within normal limits will improve Outcome: Progressing Goal: Will remain free from infection Outcome: Progressing Goal: Diagnostic test results will improve Outcome: Progressing Goal: Respiratory complications will improve Outcome: Progressing Goal: Cardiovascular complication will be avoided Outcome: Progressing   Problem: Clinical Measurements: Goal: Will remain free from infection Outcome: Progressing   Problem: Clinical Measurements: Goal: Diagnostic test results will improve Outcome: Progressing   Problem: Clinical Measurements: Goal: Respiratory complications will improve Outcome: Progressing   Problem: Activity: Goal: Risk for activity intolerance will decrease Outcome: Progressing   Problem: Nutrition: Goal: Adequate nutrition will be maintained Outcome: Progressing

## 2022-12-05 NOTE — TOC Progression Note (Signed)
Transition of Care [facility]) - Progression Note    Patient Details  Name: Kristie Hoover MRN: [id number] Date of Birth: 1950/09/08  Transition of [facility]) CM/SW Contact  Allena Katz, LCSW Phone Number: 12/05/2022, 3:28 PM  Clinical Narrative:   Tanya at [facility] reports that they wont be able to take pt until Saturday at earliest. [facility] to start auth closer to admission date.      Expected Discharge Plan: Skilled Nursing Facility Barriers to Discharge: Continued Medical Work up  Expected Discharge Plan and Services                                               Social Determinants of Health (SDOH) Interventions SDOH Screenings   Food Insecurity: Patient Unable To Answer (11/28/2022)  Housing: Patient Unable To Answer (11/28/2022)  Transportation Needs: Patient Unable To Answer (11/28/2022)  Utilities: Patient Unable To Answer (11/28/2022)  Financial Resource Strain: Low Risk  (11/22/2022)   Received from [facility]  Tobacco Use: Low Risk  (12/03/2022)    Readmission Risk Interventions     No data to display

## 2022-12-05 NOTE — Consult Note (Addendum)
PHARMACY CONSULT NOTE - ELECTROLYTES  Pharmacy Consult for Electrolyte Monitoring and Replacement   Recent Labs: Potassium (mmol/L)  Date Value  12/05/2022 2.7 (LL)   Magnesium (mg/dL)  Date Value  62/13/0865 1.7   Calcium (mg/dL)  Date Value  78/46/9629 7.9 (L)   Albumin (g/dL)  Date Value  52/84/1324 2.0 (L)   Phosphorus (mg/dL)  Date Value  40/12/2723 4.0   Sodium (mmol/L)  Date Value  12/05/2022 145   Height: 5\' 6"  (167.6 cm) Weight: 64.7 kg (142 lb 10.2 oz) IBW/kg (Calculated) : 59.3 Estimated Creatinine Clearance: 57.8 mL/min (by C-G formula based on SCr of 0.77 mg/dL).  Assessment  Kristie Hoover is a 72 y.o. female presenting with AKI and bladder mass. PMH significant for DM, HTN, HLD, memory impairment. Pharmacy has been consulted to monitor and replace electrolytes.  Diet: Regular MIVF: NS @ 75 mL/hr Pertinent medications: N/A  Goal of Therapy: Electrolytes within normal limits  Plan:  K+ 2.7. Give Kcl IV x 2 runs and Kcl x 1.  Follow-up BMP and Mag tomorrow AM  Thank you for allowing pharmacy to be a part of this patient's care.  Elliot Gurney, PharmD, BCPS Clinical Pharmacist  12/05/2022 7:26 AM

## 2022-12-05 NOTE — TOC Progression Note (Signed)
Transition of Care [facility]) - Progression Note    Patient Details  Name: Kristie Hoover MRN: [id number] Date of Birth: Mar 05, 1951  Transition of Care [facility]) CM/SW Contact  Allena Katz, LCSW Phone Number: 12/05/2022, 10:04 AM  Clinical Narrative:   CSW spoke with niece who reports she would like [facility] for LTC and rehab. Tanya with [facility] evaluating if they can take for LTC if so MD to let CSW know when to start auth.    Expected Discharge Plan: Skilled Nursing Facility Barriers to Discharge: Continued Medical Work up  Expected Discharge Plan and Services                                               Social Determinants of Health (SDOH) Interventions SDOH Screenings   Food Insecurity: Patient Unable To Answer (11/28/2022)  Housing: Patient Unable To Answer (11/28/2022)  Transportation Needs: Patient Unable To Answer (11/28/2022)  Utilities: Patient Unable To Answer (11/28/2022)  Financial Resource Strain: Low Risk  (11/22/2022)   Received from [facility]  Tobacco Use: Low Risk  (12/03/2022)    Readmission Risk Interventions     No data to display

## 2022-12-06 DIAGNOSIS — E11641 Type 2 diabetes mellitus with hypoglycemia with coma: Secondary | ICD-10-CM | POA: Diagnosis not present

## 2022-12-06 LAB — GLUCOSE, CAPILLARY
Glucose-Capillary: 130 mg/dL — ABNORMAL HIGH (ref 70–99)
Glucose-Capillary: 129 mg/dL — ABNORMAL HIGH (ref 70–99)
Glucose-Capillary: 152 mg/dL — ABNORMAL HIGH (ref 70–99)
Glucose-Capillary: 129 mg/dL — ABNORMAL HIGH (ref 70–99)

## 2022-12-06 LAB — URINALYSIS, COMPLETE (UACMP) WITH MICROSCOPIC
Bilirubin Urine: NEGATIVE
Glucose, UA: NEGATIVE mg/dL
Ketones, ur: NEGATIVE mg/dL
Leukocytes,Ua: NEGATIVE
Nitrite: NEGATIVE
Protein, ur: NEGATIVE mg/dL
Specific Gravity, Urine: 1.003 — ABNORMAL LOW (ref 1.005–1.030)
Squamous Epithelial / HPF: 0 /HPF (ref 0–5)
pH: 8 (ref 5.0–8.0)

## 2022-12-06 LAB — BASIC METABOLIC PANEL
Anion gap: 11 (ref 5–15)
BUN: 6 mg/dL — ABNORMAL LOW (ref 8–23)
CO2: 27 mmol/L (ref 22–32)
Calcium: 8 mg/dL — ABNORMAL LOW (ref 8.9–10.3)
Chloride: 104 mmol/L (ref 98–111)
Creatinine, Ser: 0.89 mg/dL (ref 0.44–1.00)
GFR, Estimated: >60 mL/min (ref >60)
Glucose, Bld: 175 mg/dL — ABNORMAL HIGH (ref 70–99)
Potassium: 2.9 mmol/L — ABNORMAL LOW (ref 3.5–5.1)
Sodium: 142 mmol/L (ref 135–145)

## 2022-12-06 LAB — MAGNESIUM: Magnesium: 1.3 mg/dL — ABNORMAL LOW (ref 1.7–2.4)

## 2022-12-06 MED ORDER — MAGNESIUM SULFATE 4 GM/100ML IV SOLN
4.0000 g | Freq: Once | INTRAVENOUS | Status: AC
  Administered 2022-12-06: 4 g via INTRAVENOUS
  Filled 2022-12-06: qty 100

## 2022-12-06 MED ORDER — MIRABEGRON ER 25 MG PO TB24
25.0000 mg | ORAL_TABLET | Freq: Every day | ORAL | Status: DC
  Administered 2022-12-06 – 2022-12-10 (×5): 25 mg via ORAL
  Filled 2022-12-06 (×5): qty 1

## 2022-12-06 MED ORDER — POTASSIUM CHLORIDE CRYS ER 20 MEQ PO TBCR
40.0000 meq | EXTENDED_RELEASE_TABLET | ORAL | Status: AC
  Administered 2022-12-06 (×2): 40 meq via ORAL
  Filled 2022-12-06 (×2): qty 2

## 2022-12-06 NOTE — Plan of Care (Signed)
?  Problem: Education: ?Goal: Knowledge of General Education information will improve ?Description: Including pain rating scale, medication(s)/side effects and non-pharmacologic comfort measures ?Outcome: Progressing ?  ?Problem: Health Behavior/Discharge Planning: ?Goal: Ability to manage health-related needs will improve ?Outcome: Progressing ?  ?Problem: Clinical Measurements: ?Goal: Ability to maintain clinical measurements within normal limits will improve ?Outcome: Progressing ?Goal: Will remain free from infection ?Outcome: Progressing ?Goal: Diagnostic test results will improve ?Outcome: Progressing ?Goal: Respiratory complications will improve ?Outcome: Progressing ?Goal: Cardiovascular complication will be avoided ?Outcome: Progressing ?  ?Problem: Activity: ?Goal: Risk for activity intolerance will decrease ?Outcome: Progressing ?  ?Problem: Nutrition: ?Goal: Adequate nutrition will be maintained ?Outcome: Progressing ?  ?Problem: Coping: ?Goal: Level of anxiety will decrease ?Outcome: Progressing ?  ?Problem: Elimination: ?Goal: Will not experience complications related to bowel motility ?Outcome: Progressing ?Goal: Will not experience complications related to urinary retention ?Outcome: Progressing ?  ?Problem: Pain Managment: ?Goal: General experience of comfort will improve ?Outcome: Progressing ?  ?Problem: Safety: ?Goal: Ability to remain free from injury will improve ?Outcome: Progressing ?  ?Problem: Skin Integrity: ?Goal: Risk for impaired skin integrity will decrease ?Outcome: Progressing ?  ?Problem: Education: ?Goal: Knowledge of risk factors and measures for prevention of condition will improve ?Outcome: Progressing ?  ?

## 2022-12-06 NOTE — Progress Notes (Signed)
PROGRESS NOTE    Kristie Hoover  EAV:[id number] DOB: 1950-03-25 DOA: 11/28/2022 PCP: [facility]    Brief Narrative:   Kristie Hoover is a 72 y.o. female with medical history significant for DM, HTN, HLD, recently evaluated by neurologist Dr. Malvin Johns on 11/22/2022 with a new diagnosis of memory impairment and delusional disorder after being evaluated for for 12-month history of progressive memory loss, delusions about property being stolen and concern for medication compliance.  She was started on risperidone. She was also seen in the emergency department by psychiatrist in August 2024 for confusion, paranoia, hearing voices and poor memory. She presented to the emergency department after she was found unresponsive at home.  Family had been trying to get in touch with her without any response.  When EMS arrived, she was given Narcan without any response.  She was then found to have a blood glucose level of 23.  She was given D10 with improvement in her glucose by the time she arrived in the emergency department.     In the ED, she was found to have AKI with creatinine of 6.56, hypokalemia with potassium of 2.5, hypomagnesemia with magnesium of 1.0.  Glucose level dropped again from 107-23 in the emergency department.  She had recurrent hypoglycemic episodes in the ED.  She was started on 10% dextrose infusion for recurrent hypoglycemia and IV normal saline infusion for AKI.  Renal ultrasound also revealed a large bladder mass.  9/19: Notified by RN that patient had increased urinary frequency.  Urinalysis bland.  Not retaining urine.  Discussed with urology.  Recommend Myrbetriq.  Suspect that increased urinary frequency secondary to bladder mass.   Assessment & Plan:   Principal Problem:   Uncontrolled diabetes mellitus with hypoglycemic coma, without long-term current use of insulin (HCC) Active Problems:   AKI (acute kidney injury) (HCC)   High anion gap metabolic acidosis    Hypokalemia   COVID-19 virus infection   Auditory hallucinations   Delusional disorder (HCC)   Hypocalcemia   HTN (hypertension)   Dementia without behavioral disturbance (HCC)   Asthma   Hypercholesterolemia   Anemia   Malnutrition of moderate degree   Type II DM with recurrent hypoglycemia Glucose levels have stabilized.  off of IV 10% dextrose infusion.  Last hemoglobin A1c 6 Plan: Continue regular diet 3 times daily feeding supplements Discontinue glipizide/metformin at discharge Diet controlled diabetes     Acute kidney injury: Resolved. Creatinine is down from 6.95-6.15-4.20-2.55-1.31-0.96.      Hypokalemia, hypomagnesemia and hypocalcemia: Likely dietary in nature.  Monitor and replace as necessary     Large bladder mass, increased urinary frequency: Noted on 9/19 by bedside RN.  Urinalysis and bladder scan reassuring.  Start Myrbetriq 25 mg daily.  Place PureWick if needed.  Outpatient follow-up with urologist for cystoscopy.      Acute metabolic encephalopathy: Patient appears to be back to her baseline mental status but she has underlying dementia.     Auditory hallucinations, delusional disorder, dementia with behavioral disturbance: Continue supportive care.     Hypertension: Continue amlodipine and hydralazine.  Increase amlodipine from 2.5 mg to 5 mg daily.     COVID-19 infection: Asymptomatic.  She is tolerating room air.       General Weakness: PT recommended discharge to SNF.  Follow-up with social worker to assist with disposition.     Other comorbidities include hypertension, asthma, hypercholesterolemia, chronic anemia   DVT prophylaxis: SQ heparin Code Status: Full Family Communication:Niece  [phone] Disposition Plan: Status is: Inpatient Remains inpatient appropriate because: Awaiting placement medically stable   Level of care: Med-Surg  Consultants:  None  Procedures:  None  Antimicrobials: None   Subjective: Seen and  salmon.  No acute events overnight.  No new complaints  Objective: Vitals:   12/06/22 0229 12/06/22 0442 12/06/22 0837 12/06/22 0848  BP:  (!) 169/88 (!) 165/88   Pulse:  81 (!) 119 79  Resp:  18 16   Temp:  98.2 F (36.8 C) 98 F (36.7 C)   TempSrc:      SpO2:  100% 94% 99%  Weight: 66.2 kg     Height:       No intake or output data in the 24 hours ending 12/06/22 1157 Filed Weights   12/04/22 0529 12/05/22 0614 12/06/22 0229  Weight: 68.4 kg 64.7 kg 66.2 kg    Examination:  General exam: NAD Respiratory system: Lungs clear.  Normal work of breathing.  Room air Cardiovascular system: S1-S2, RRR, no murmurs Gastrointestinal system: Soft, NT/ND, normal bowel sounds Central nervous system: Alert.  Oriented x 1.  No focal deficits Extremities: Symmetric 5 x 5 power. Skin: No rashes, lesions or ulcers Psychiatry: Judgement and insight appear impaired. Mood & affect flattened.     Data Reviewed: I have personally reviewed following labs and imaging studies  CBC: Recent Labs  Lab 11/30/22 0601 12/02/22 0337  WBC 11.9* 8.5  NEUTROABS 9.5*  --   HGB 11.9* 10.8*  HCT 35.2* 31.6*  MCV 83.2 82.7  PLT 309 297   Basic Metabolic Panel: Recent Labs  Lab 11/30/22 0601 12/01/22 0538 12/02/22 0337 12/03/22 0833 12/04/22 0604 12/05/22 0433 12/06/22 0433 12/06/22 1049  NA 138 139 141 145 146* 145  --  142  K 4.0 3.8 3.3* 3.6 3.6 2.7*  --  2.9*  CL 102 108 110 116* 114* 110  --  104  CO2 21* 20* 20* 21* 23 26  --  27  GLUCOSE 157* 150* 124* 130* 128* 120*  --  175*  BUN 46* 53* 46* 26* 17 9  --  6*  CREATININE 6.15* 4.20* 2.55* 1.31* 0.96 0.77  --  0.89  CALCIUM 8.0* 8.4* 8.3* 8.3* 8.4* 7.9*  --  8.0*  MG 3.1* 2.4 2.0 1.7  --   --  1.3*  --   PHOS 4.2 4.3 4.0  --   --   --   --   --    GFR: Estimated Creatinine Clearance: 51.9 mL/min (by C-G formula based on SCr of 0.89 mg/dL). Liver Function Tests: Recent Labs  Lab 11/30/22 0601 12/01/22 0538 12/02/22 0337   ALBUMIN 2.2* 1.8* 2.0*   No results for input(s): "LIPASE", "AMYLASE" in the last 168 hours. No results for input(s): "AMMONIA" in the last 168 hours.  Coagulation Profile: No results for input(s): "INR", "PROTIME" in the last 168 hours. Cardiac Enzymes: Recent Labs  Lab 11/30/22 0040  CKTOTAL 161   BNP (last 3 results) No results for input(s): "PROBNP" in the last 8760 hours. HbA1C: No results for input(s): "HGBA1C" in the last 72 hours. CBG: Recent Labs  Lab 12/05/22 1207 12/05/22 1801 12/05/22 1956 12/06/22 0828 12/06/22 1141  GLUCAP 133* 150* 165* 130* 129*   Lipid Profile: No results for input(s): "CHOL", "HDL", "LDLCALC", "TRIG", "CHOLHDL", "LDLDIRECT" in the last 72 hours. Thyroid Function Tests: No results for input(s): "TSH", "T4TOTAL", "FREET4", "T3FREE", "THYROIDAB" in the last 72 hours. Anemia Panel: No results for input(s): "  VITAMINB12", "FOLATE", "FERRITIN", "TIBC", "IRON", "RETICCTPCT" in the last 72 hours. Sepsis Labs: No results for input(s): "PROCALCITON", "LATICACIDVEN" in the last 168 hours.   Recent Results (from the past 240 hour(s))  Resp panel by RT-PCR (RSV, Flu A&B, Covid) Anterior Nasal Swab     Status: Abnormal   Collection Time: 11/28/22  6:12 PM   Specimen: Anterior Nasal Swab  Result Value Ref Range Status   SARS Coronavirus 2 by RT PCR POSITIVE (A) NEGATIVE Final    Comment: (NOTE) SARS-CoV-2 target nucleic acids are DETECTED.  The SARS-CoV-2 RNA is generally detectable in upper respiratory specimens during the acute phase of infection. Positive results are indicative of the presence of the identified virus, but do not rule out bacterial infection or co-infection with other pathogens not detected by the test. Clinical correlation with patient history and other diagnostic information is necessary to determine patient infection status. The expected result is Negative.  Fact Sheet for  Patients: BloggerCourse.com  Fact Sheet for Healthcare Providers: SeriousBroker.it  This test is not yet approved or cleared by the [place] FDA and  has been authorized for detection and/or diagnosis of SARS-CoV-2 by FDA under an Emergency Use Authorization (EUA).  This EUA will remain in effect (meaning this test can be used) for the duration of  the COVID-19 declaration under Section 564(b)(1) of the A ct, 21 U.S.C. section 360bbb-3(b)(1), unless the authorization is terminated or revoked sooner.     Influenza A by PCR NEGATIVE NEGATIVE Final   Influenza B by PCR NEGATIVE NEGATIVE Final    Comment: (NOTE) The Xpert Xpress SARS-CoV-2/FLU/RSV plus assay is intended as an aid in the diagnosis of influenza from Nasopharyngeal swab specimens and should not be used as a sole basis for treatment. Nasal washings and aspirates are unacceptable for Xpert Xpress SARS-CoV-2/FLU/RSV testing.  Fact Sheet for Patients: BloggerCourse.com  Fact Sheet for Healthcare Providers: SeriousBroker.it  This test is not yet approved or cleared by the [place] FDA and has been authorized for detection and/or diagnosis of SARS-CoV-2 by FDA under an Emergency Use Authorization (EUA). This EUA will remain in effect (meaning this test can be used) for the duration of the COVID-19 declaration under Section 564(b)(1) of the Act, 21 U.S.C. section 360bbb-3(b)(1), unless the authorization is terminated or revoked.     Resp Syncytial Virus by PCR NEGATIVE NEGATIVE Final    Comment: (NOTE) Fact Sheet for Patients: BloggerCourse.com  Fact Sheet for Healthcare Providers: SeriousBroker.it  This test is not yet approved or cleared by the [place] FDA and has been authorized for detection and/or diagnosis of SARS-CoV-2 by FDA under an Emergency Use  Authorization (EUA). This EUA will remain in effect (meaning this test can be used) for the duration of the COVID-19 declaration under Section 564(b)(1) of the Act, 21 U.S.C. section 360bbb-3(b)(1), unless the authorization is terminated or revoked.  Performed at [facility], [address] [place], [place] [address]   Urine Culture     Status: None   Collection Time: 11/28/22  6:39 PM   Specimen: Urine, Random  Result Value Ref Range Status   Specimen Description   Final    URINE, RANDOM Performed at [facility], [address] [place], [place] [address]    Special Requests   Final    NONE Reflexed from [id number] Performed at [facility], [address], [place], [place] [address]    Culture   Final    NO GROWTH Performed at  [facility], [address], [place], [place] [address]    Report Status 11/30/2022 FINAL  Final  MRSA Next Gen by PCR, Nasal     Status: None   Collection Time: 11/28/22 10:28 PM   Specimen: Nasal Mucosa; Nasal Swab  Result Value Ref Range Status   MRSA by PCR Next Gen NOT DETECTED NOT DETECTED Final    Comment: (NOTE) The GeneXpert MRSA Assay (FDA approved for NASAL specimens only), is one component of a comprehensive MRSA colonization surveillance program. It is not intended to diagnose MRSA infection nor to guide or monitor treatment for MRSA infections. Test performance is not FDA approved in patients less than 62 years old. Performed at [facility], [address], [place], [place] [address]          Radiology Studies: No results found.      Scheduled Meds:  amLODipine  5 mg Oral Daily   feeding supplement  237 mL Oral TID BM   heparin injection (subcutaneous)  5,000 Units Subcutaneous Q8H   hydrALAZINE  10 mg Oral Q8H   mirabegron ER  25 mg Oral Daily   mirtazapine  15 mg Oral QHS   multivitamin with minerals  1 tablet Oral Daily   potassium chloride  40 mEq Oral Q2H    Continuous Infusions:  magnesium sulfate bolus IVPB       LOS: 8 days    Tresa Moore, MD [facility]   If 7PM-7AM, please contact night-coverage  12/06/2022, 11:57 AM

## 2022-12-06 NOTE — Progress Notes (Signed)
   12/06/22 0837  Assess: MEWS Score  Temp 98 F (36.7 C)  BP (!) 165/88  MAP (mmHg) 107  Pulse Rate (!) 119  Resp 16  Level of Consciousness Alert  SpO2 94 %  O2 Device Room Air  Patient Activity (if Appropriate) In bed  Assess: MEWS Score  MEWS Temp 0  MEWS Systolic 0  MEWS Pulse 2  MEWS RR 0  MEWS LOC 0  MEWS Score 2  MEWS Score Color Yellow  Assess: if the MEWS score is Yellow or Red  Were vital signs accurate and taken at a resting state? Yes  Does the patient meet 2 or more of the SIRS criteria? No  MEWS guidelines implemented  Yes, yellow  Treat  MEWS Interventions Considered administering scheduled or prn medications/treatments as ordered  Take Vital Signs  Increase Vital Sign Frequency  Yellow: Q2hr x1, continue Q4hrs until patient remains green for 12hrs  Escalate  MEWS: Escalate Yellow: Discuss with charge nurse and consider notifying provider and/or RRT  Notify: Charge Nurse/RN  Name of Charge Nurse/RN Notified Grier Mitts, Rn  Assess: SIRS CRITERIA  SIRS Temperature  0  SIRS Pulse 1  SIRS Respirations  0  SIRS WBC 0  SIRS Score Sum  1

## 2022-12-06 NOTE — Progress Notes (Signed)
Nutrition Follow-up  DOCUMENTATION CODES:   Non-severe (moderate) malnutrition in context of social or environmental circumstances  INTERVENTION:   -Continue Ensure Enlive po TID, each supplement provides 350 kcal and 20 grams of protein.  -Continue Magic cup TID with meals, each supplement provides 290 kcal and 9 grams of protein  -Continue MVI daily -Continue liberalized diet to regular diet -RD will sign off secondary to medical stability; if further nutritional needs arise, please re-consult RD  NUTRITION DIAGNOSIS:   Moderate Malnutrition related to social / environmental circumstances as evidenced by moderate fat depletion, moderate muscle depletion.  Ongoing  GOAL:   Patient will meet greater than or equal to 90% of their needs  Progressing   MONITOR:   PO intake, Supplement acceptance, Labs, Weight trends, Skin, I & O's  REASON FOR ASSESSMENT:   Consult Assessment of nutrition requirement/status  ASSESSMENT:   72 y/o female with h/o DM, hypertension, hyperlipidemia, asthma, DDD, GERD and recent diagnosis of memory impairment and delusional disorder who is admitted with COVID 19, AKI and new bladder mass.  Reviewed I/O's: +4.5 L since admission  Pt remains with good appetite and drinking Ensure supplements.   Per TOC notes, pt is medically stable for discharge and awaiting placement. RD will sign off secondary to medical stability.    Wt has been stable since admission.   Medications reviewed and include remeron and potassium chloride.   Labs reviewed: K: 2.9 (on PO supplementation), CBGS: 129-165 (inpatient orders for glycemic control are ).    Diet Order:   Diet Order             Diet regular Room service appropriate? Yes; Fluid consistency: Thin  Diet effective now                   EDUCATION NEEDS:   Education needs have been addressed  Skin:  Skin Assessment: Reviewed RN Assessment  Last BM:  12/03/22 (type 5)  Height:   Ht Readings  from Last 1 Encounters:  12/01/22 5\' 6"  (1.676 m)    Weight:   Wt Readings from Last 1 Encounters:  12/06/22 66.2 kg    Ideal Body Weight:  61.36 kg  BMI:  Body mass index is 23.56 kg/m.  Estimated Nutritional Needs:   Kcal:  1850-2050  Protein:  90-105 grams  Fluid:  > 1.8 L    Levada Schilling, RD, LDN, CDCES Registered Dietitian II Certified Diabetes Care and Education Specialist Please refer to [facility] for RD and/or RD on-call/weekend/after hours pager

## 2022-12-06 NOTE — Consult Note (Signed)
PHARMACY CONSULT NOTE - ELECTROLYTES  Pharmacy Consult for Electrolyte Monitoring and Replacement   Recent Labs: Potassium (mmol/L)  Date Value  12/06/2022 2.9 (L)   Magnesium (mg/dL)  Date Value  16/12/9602 1.3 (L)   Calcium (mg/dL)  Date Value  54/11/8117 8.0 (L)   Albumin (g/dL)  Date Value  14/78/2956 2.0 (L)   Phosphorus (mg/dL)  Date Value  21/30/8657 4.0   Sodium (mmol/L)  Date Value  12/06/2022 142   Height: 5\' 6"  (167.6 cm) Weight: 66.2 kg (145 lb 15.1 oz) IBW/kg (Calculated) : 59.3 Estimated Creatinine Clearance: 51.9 mL/min (by C-G formula based on SCr of 0.89 mg/dL).  Assessment  Kristie Hoover is a 72 y.o. female presenting with AKI and bladder mass. PMH significant for DM, HTN, HLD, memory impairment. Pharmacy has been consulted to monitor and replace electrolytes.  Diet: Regular Pertinent medications: N/A  Goal of Therapy: Electrolytes within normal limits  Plan:  K+ 2.9. Give KCL PO x 2 doses Magnesium 1.3. Give 4.0g IV Mg Sulfate x1.  Follow-up BMP and Mag tomorrow AM  Thank you for allowing pharmacy to be a part of this patient's care.  Erasmo Leventhal, PharmD Candidate 2025  12/06/2022 11:26 AM

## 2022-12-06 NOTE — Plan of Care (Signed)

## 2022-12-06 NOTE — TOC Progression Note (Signed)
Transition of Care [facility]) - Progression Note    Patient Details  Name: Kristie Hoover MRN: [id number] Date of Birth: January 14, 1951  Transition of Care [facility]) CM/SW Contact  Allena Katz, LCSW Phone Number: 12/06/2022, 4:29 PM  Clinical Narrative:    Tanya with [facility] reports it may now be Monday before she is able to accept pt.   Expected Discharge Plan: Skilled Nursing Facility Barriers to Discharge: Continued Medical Work up  Expected Discharge Plan and Services                                               Social Determinants of Health (SDOH) Interventions SDOH Screenings   Food Insecurity: Patient Unable To Answer (11/28/2022)  Housing: Patient Unable To Answer (11/28/2022)  Transportation Needs: Patient Unable To Answer (11/28/2022)  Utilities: Patient Unable To Answer (11/28/2022)  Financial Resource Strain: Low Risk  (11/22/2022)   Received from [facility]  Tobacco Use: Low Risk  (12/03/2022)    Readmission Risk Interventions     No data to display

## 2022-12-07 DIAGNOSIS — E11641 Type 2 diabetes mellitus with hypoglycemia with coma: Secondary | ICD-10-CM | POA: Diagnosis not present

## 2022-12-07 LAB — GLUCOSE, CAPILLARY
Glucose-Capillary: 103 mg/dL — ABNORMAL HIGH (ref 70–99)
Glucose-Capillary: 193 mg/dL — ABNORMAL HIGH (ref 70–99)
Glucose-Capillary: 141 mg/dL — ABNORMAL HIGH (ref 70–99)

## 2022-12-07 LAB — BASIC METABOLIC PANEL
Anion gap: 11 (ref 5–15)
BUN: 5 mg/dL — ABNORMAL LOW (ref 8–23)
CO2: 27 mmol/L (ref 22–32)
Calcium: 7.7 mg/dL — ABNORMAL LOW (ref 8.9–10.3)
Chloride: 103 mmol/L (ref 98–111)
Creatinine, Ser: 0.75 mg/dL (ref 0.44–1.00)
GFR, Estimated: >60 mL/min (ref >60)
Glucose, Bld: 134 mg/dL — ABNORMAL HIGH (ref 70–99)
Potassium: 3 mmol/L — ABNORMAL LOW (ref 3.5–5.1)
Sodium: 141 mmol/L (ref 135–145)

## 2022-12-07 LAB — MAGNESIUM: Magnesium: 2.1 mg/dL (ref 1.7–2.4)

## 2022-12-07 MED ORDER — POTASSIUM CHLORIDE CRYS ER 20 MEQ PO TBCR
40.0000 meq | EXTENDED_RELEASE_TABLET | ORAL | Status: AC
  Administered 2022-12-07 (×2): 40 meq via ORAL
  Filled 2022-12-07 (×2): qty 2

## 2022-12-07 NOTE — Progress Notes (Addendum)
Occupational Therapy Treatment Patient Details Name: Kristie Hoover MRN: [id number] DOB: Jan 29, 1951 Today's Date: 12/07/2022   History of present illness Pt is a 72 y.o. female with a PMHx of diabetes mellitus type 2, GERD, hiatal hernia, hypercholesterolemia, hypertension, degenerative disc disease, asthma, history of memory impairment and delusional disorder, who was admitted to [facility] on 11/28/2022 for evaluation of significant hypoglycemia, COVID-19 infection, acute kidney injury.   OT comments  Kristie Hoover was seen for OT treatment on this date. Upon arrival to room pt seated EOB, agreeable to tx. Pt orietned to self and location; makes comments to the wall. Pt requires SBA for BSC t/f, pericare in sitting, and donning underwear seated on BSC. CGA standing hand washing and transporting dirty linen to bathroom. Pt making good progress toward goals, will continue to follow POC. Pt primarily limited by cognition. Discharge recommendation remains appropriate as pt lives alone.       If plan is discharge home, recommend the following:  A lot of help with bathing/dressing/bathroom;Supervision due to cognitive status;Help with stairs or ramp for entrance;A little help with walking and/or transfers   Equipment Recommendations  BSC/3in1    Recommendations for Other Services      Precautions / Restrictions Precautions Precautions: Fall Restrictions Weight Bearing Restrictions: No       Mobility Bed Mobility Overal bed mobility: Needs Assistance Bed Mobility: Supine to Sit, Sit to Supine     Supine to sit: Supervision Sit to supine: Supervision        Transfers Overall transfer level: Needs assistance Equipment used: None Transfers: Sit to/from Stand Sit to Stand: Contact guard assist                 Balance Overall balance assessment: Needs assistance Sitting-balance support: No upper extremity supported, Feet supported Sitting balance-Leahy Scale: Fair                                      ADL either performed or assessed with clinical judgement   ADL Overall ADL's : Needs assistance/impaired                                       General ADL Comments: SBA for BSC t/f, pericare sitting and don underwear seated on BSC. CGA standing hand washing and transporting dirty linen to bathroom      Cognition Arousal: Alert Behavior During Therapy: WFL for tasks assessed/performed Overall Cognitive Status: No family/caregiver present to determine baseline cognitive functioning Area of Impairment: Orientation, Memory, Following commands, Problem solving                 Orientation Level: Disoriented to, Time   Memory: Decreased recall of precautions, Decreased short-term memory Following Commands: Follows one step commands consistently     Problem Solving: Slow processing, Requires verbal cues General Comments: pt making remarks to the wall next to her "she handed me drawers" when pt provided clean underwear         Pertinent Vitals/ Pain       Pain Assessment Pain Assessment: No/denies pain   Frequency  Min 1X/week        Progress Toward Goals  OT Goals(current goals can now be found in the care plan section)  Progress towards OT goals: Progressing toward goals  Acute Rehab OT Goals Patient  Stated Goal: to return to PLOF OT Goal Formulation: With patient Time For Goal Achievement: 12/14/22 Potential to Achieve Goals: Good ADL Goals Pt Will Perform Grooming: with modified independence;standing Pt Will Perform Lower Body Dressing: with modified independence;sit to/from stand Pt Will Transfer to Toilet: with modified independence;ambulating;regular height toilet  Plan      Co-evaluation                 AM-PAC OT "6 Clicks" Daily Activity     Outcome Measure   Help from another person eating meals?: None Help from another person taking care of personal grooming?: A Little Help from another  person toileting, which includes using toliet, bedpan, or urinal?: A Little Help from another person bathing (including washing, rinsing, drying)?: A Little Help from another person to put on and taking off regular upper body clothing?: A Little Help from another person to put on and taking off regular lower body clothing?: A Little 6 Click Score: 19    End of Session    OT Visit Diagnosis: Other abnormalities of gait and mobility (R26.89)   Activity Tolerance Patient tolerated treatment well   Patient Left in bed;with call bell/phone within reach;with bed alarm set   Nurse Communication          Time: 9147-8295 OT Time Calculation (min): 16 min  Charges: OT General Charges $OT Visit: 1 Visit OT Treatments $Self Care/Home Management : 8-22 mins  Kathie Dike, M.S. OTR/L  12/07/22, 12:37 PM  ascom [phone]

## 2022-12-07 NOTE — Plan of Care (Signed)
  Problem: Education: Goal: Knowledge of General Education information will improve Description: Including pain rating scale, medication(s)/side effects and non-pharmacologic comfort measures Outcome: Progressing   Problem: Health Behavior/Discharge Planning: Goal: Ability to manage health-related needs will improve Outcome: Progressing   Problem: Clinical Measurements: Goal: Ability to maintain clinical measurements within normal limits will improve Outcome: Progressing Goal: Will remain free from infection Outcome: Progressing Goal: Diagnostic test results will improve Outcome: Progressing Goal: Respiratory complications will improve Outcome: Progressing Goal: Cardiovascular complication will be avoided Outcome: Progressing   Problem: Activity: Goal: Risk for activity intolerance will decrease Outcome: Progressing   Problem: Nutrition: Goal: Adequate nutrition will be maintained Outcome: Progressing   Problem: Coping: Goal: Level of anxiety will decrease Outcome: Progressing   Problem: Elimination: Goal: Will not experience complications related to bowel motility Outcome: Progressing Goal: Will not experience complications related to urinary retention Outcome: Progressing   Problem: Pain Managment: Goal: General experience of comfort will improve Outcome: Progressing   Problem: Safety: Goal: Ability to remain free from injury will improve Outcome: Progressing   Problem: Skin Integrity: Goal: Risk for impaired skin integrity will decrease Outcome: Progressing   Problem: Education: Goal: Knowledge of risk factors and measures for prevention of condition will improve Outcome: Progressing   Problem: Coping: Goal: Psychosocial and spiritual needs will be supported Outcome: Progressing   Problem: Respiratory: Goal: Will maintain a patent airway Outcome: Progressing Goal: Complications related to the disease process, condition or treatment will be avoided or  minimized Outcome: Progressing   Problem: Education: Goal: Knowledge of disease and its progression will improve Outcome: Progressing   Problem: Health Behavior/Discharge Planning: Goal: Ability to manage health-related needs will improve Outcome: Progressing   Problem: Clinical Measurements: Goal: Complications related to the disease process or treatment will be avoided or minimized Outcome: Progressing Goal: Dialysis access will remain free of complications Outcome: Progressing   Problem: Activity: Goal: Activity intolerance will improve Outcome: Progressing   Problem: Fluid Volume: Goal: Fluid volume balance will be maintained or improved Outcome: Progressing   Problem: Nutritional: Goal: Ability to make appropriate dietary choices will improve Outcome: Progressing   Problem: Respiratory: Goal: Respiratory symptoms related to disease process will be avoided Outcome: Progressing   Problem: Self-Concept: Goal: Body image disturbance will be avoided or minimized Outcome: Progressing   Problem: Urinary Elimination: Goal: Progression of disease will be identified and treated Outcome: Progressing

## 2022-12-07 NOTE — Progress Notes (Signed)
PROGRESS NOTE    Kristie Hoover  WUJ:[id number] DOB: 1950-07-01 DOA: 11/28/2022 PCP: [facility]    Brief Narrative:   Kristie Hoover is a 72 y.o. female with medical history significant for DM, HTN, HLD, recently evaluated by neurologist Dr. Malvin Johns on 11/22/2022 with a new diagnosis of memory impairment and delusional disorder after being evaluated for for 34-month history of progressive memory loss, delusions about property being stolen and concern for medication compliance.  She was started on risperidone. She was also seen in the emergency department by psychiatrist in August 2024 for confusion, paranoia, hearing voices and poor memory. She presented to the emergency department after she was found unresponsive at home.  Family had been trying to get in touch with her without any response.  When EMS arrived, she was given Narcan without any response.  She was then found to have a blood glucose level of 23.  She was given D10 with improvement in her glucose by the time she arrived in the emergency department.     In the ED, she was found to have AKI with creatinine of 6.56, hypokalemia with potassium of 2.5, hypomagnesemia with magnesium of 1.0.  Glucose level dropped again from 107-23 in the emergency department.  She had recurrent hypoglycemic episodes in the ED.  She was started on 10% dextrose infusion for recurrent hypoglycemia and IV normal saline infusion for AKI.  Renal ultrasound also revealed a large bladder mass.  9/19: Notified by RN that patient had increased urinary frequency.  Urinalysis bland.  Not retaining urine.  Discussed with urology.  Recommend Myrbetriq.  Suspect that increased urinary frequency secondary to bladder mass.  9/20: No acute events   Assessment & Plan:   Principal Problem:   Uncontrolled diabetes mellitus with hypoglycemic coma, without long-term current use of insulin (HCC) Active Problems:   AKI (acute kidney injury) (HCC)   High anion gap  metabolic acidosis   Hypokalemia   COVID-19 virus infection   Auditory hallucinations   Delusional disorder (HCC)   Hypocalcemia   HTN (hypertension)   Dementia without behavioral disturbance (HCC)   Asthma   Hypercholesterolemia   Anemia   Malnutrition of moderate degree   Type II DM with recurrent hypoglycemia Glucose levels have stabilized.  off of IV 10% dextrose infusion.  Last hemoglobin A1c 6 Plan: Continue regular diet 3 times daily feeding supplements Discontinue glipizide/metformin at discharge Diet controlled diabetes     Acute kidney injury: Resolved. Creatinine is down from 6.95-6.15-4.20-2.55-1.31-0.96.      Hypokalemia, hypomagnesemia and hypocalcemia:  Likely dietary in nature.  Monitor and replace as necessary K3.09/20.  Ordered p.o. replacement     Large bladder mass, increased urinary frequency:  Noted on 9/19 by bedside RN.   Urinalysis and bladder scan reassuring.   Plan: Continue Myrbetriq 25 mg daily Pure wick for patient safety Outpatient follow-up with urology Start Myrbetriq 25 mg daily.  Place PureWick if needed.  Outpatient follow-up with urologist for cystoscopy.      Acute metabolic encephalopathy: Patient appears to be back to her baseline mental status but she has underlying dementia.     Auditory hallucinations, delusional disorder, dementia with behavioral disturbance: Continue supportive care.     Hypertension: Continue amlodipine and hydralazine.  Increase amlodipine from 2.5 mg to 5 mg daily.     COVID-19 infection: Asymptomatic.  She is tolerating room air.       General Weakness: PT recommended discharge to SNF.  Follow-up with  social worker to assist with disposition.     Other comorbidities include hypertension, asthma, hypercholesterolemia, chronic anemia   DVT prophylaxis: SQ heparin Code Status: Full Family Communication:Niece [phone] on 9/18; 9/20 Disposition Plan: Status is: Inpatient Remains inpatient  appropriate because: Awaiting placement medically stable   Level of care: Med-Surg  Consultants:  None  Procedures:  None  Antimicrobials: None   Subjective: Seen and examined.  No acute events overnight.  No new complaints this morning.  Objective: Vitals:   12/06/22 2113 12/07/22 0434 12/07/22 0446 12/07/22 0741  BP: (!) 153/99 132/67  138/73  Pulse: (!) 51 91  88  Resp: 16 18  14   Temp: 98.1 F (36.7 C) 99.6 F (37.6 C)  98.3 F (36.8 C)  TempSrc: Oral     SpO2: 100% 97%  100%  Weight:   61.1 kg   Height:       No intake or output data in the 24 hours ending 12/07/22 1056 Filed Weights   12/05/22 0614 12/06/22 0229 12/07/22 0446  Weight: 64.7 kg 66.2 kg 61.1 kg    Examination:  General exam: No acute distress.  Frail-appearing Respiratory system: Lungs clear.  Normal work of breathing.  Room air Cardiovascular system: S1-S2, RRR, no murmurs Gastrointestinal system: Soft, NT/ND, normal bowel sounds Central nervous system: Alert oriented x 1, no focal deficits Extremities: Symmetric 5 x 5 power. Skin: No rashes, lesions or ulcers Psychiatry: Judgement and insight appear impaired. Mood & affect flattened.     Data Reviewed: I have personally reviewed following labs and imaging studies  CBC: Recent Labs  Lab 12/02/22 0337  WBC 8.5  HGB 10.8*  HCT 31.6*  MCV 82.7  PLT 297   Basic Metabolic Panel: Recent Labs  Lab 12/01/22 0538 12/02/22 0337 12/03/22 0833 12/04/22 0604 12/05/22 0433 12/06/22 0433 12/06/22 1049 12/07/22 0529  NA 139 141 145 146* 145  --  142 141  K 3.8 3.3* 3.6 3.6 2.7*  --  2.9* 3.0*  CL 108 110 116* 114* 110  --  104 103  CO2 20* 20* 21* 23 26  --  27 27  GLUCOSE 150* 124* 130* 128* 120*  --  175* 134*  BUN 53* 46* 26* 17 9  --  6* 5*  CREATININE 4.20* 2.55* 1.31* 0.96 0.77  --  0.89 0.75  CALCIUM 8.4* 8.3* 8.3* 8.4* 7.9*  --  8.0* 7.7*  MG 2.4 2.0 1.7  --   --  1.3*  --  2.1  PHOS 4.3 4.0  --   --   --   --   --   --     GFR: Estimated Creatinine Clearance: 57.8 mL/min (by C-G formula based on SCr of 0.75 mg/dL). Liver Function Tests: Recent Labs  Lab 12/01/22 0538 12/02/22 0337  ALBUMIN 1.8* 2.0*   No results for input(s): "LIPASE", "AMYLASE" in the last 168 hours. No results for input(s): "AMMONIA" in the last 168 hours.  Coagulation Profile: No results for input(s): "INR", "PROTIME" in the last 168 hours. Cardiac Enzymes: No results for input(s): "CKTOTAL", "CKMB", "CKMBINDEX", "TROPONINI" in the last 168 hours.  BNP (last 3 results) No results for input(s): "PROBNP" in the last 8760 hours. HbA1C: No results for input(s): "HGBA1C" in the last 72 hours. CBG: Recent Labs  Lab 12/06/22 0828 12/06/22 1141 12/06/22 1636 12/06/22 2238 12/07/22 0752  GLUCAP 130* 129* 152* 129* 103*   Lipid Profile: No results for input(s): "CHOL", "HDL", "LDLCALC", "TRIG", "CHOLHDL", "LDLDIRECT" in  the last 72 hours. Thyroid Function Tests: No results for input(s): "TSH", "T4TOTAL", "FREET4", "T3FREE", "THYROIDAB" in the last 72 hours. Anemia Panel: No results for input(s): "VITAMINB12", "FOLATE", "FERRITIN", "TIBC", "IRON", "RETICCTPCT" in the last 72 hours. Sepsis Labs: No results for input(s): "PROCALCITON", "LATICACIDVEN" in the last 168 hours.   Recent Results (from the past 240 hour(s))  Resp panel by RT-PCR (RSV, Flu A&B, Covid) Anterior Nasal Swab     Status: Abnormal   Collection Time: 11/28/22  6:12 PM   Specimen: Anterior Nasal Swab  Result Value Ref Range Status   SARS Coronavirus 2 by RT PCR POSITIVE (A) NEGATIVE Final    Comment: (NOTE) SARS-CoV-2 target nucleic acids are DETECTED.  The SARS-CoV-2 RNA is generally detectable in upper respiratory specimens during the acute phase of infection. Positive results are indicative of the presence of the identified virus, but do not rule out bacterial infection or co-infection with other pathogens not detected by the test. Clinical  correlation with patient history and other diagnostic information is necessary to determine patient infection status. The expected result is Negative.  Fact Sheet for Patients: BloggerCourse.com  Fact Sheet for Healthcare Providers: SeriousBroker.it  This test is not yet approved or cleared by the [place] FDA and  has been authorized for detection and/or diagnosis of SARS-CoV-2 by FDA under an Emergency Use Authorization (EUA).  This EUA will remain in effect (meaning this test can be used) for the duration of  the COVID-19 declaration under Section 564(b)(1) of the A ct, 21 U.S.C. section 360bbb-3(b)(1), unless the authorization is terminated or revoked sooner.     Influenza A by PCR NEGATIVE NEGATIVE Final   Influenza B by PCR NEGATIVE NEGATIVE Final    Comment: (NOTE) The Xpert Xpress SARS-CoV-2/FLU/RSV plus assay is intended as an aid in the diagnosis of influenza from Nasopharyngeal swab specimens and should not be used as a sole basis for treatment. Nasal washings and aspirates are unacceptable for Xpert Xpress SARS-CoV-2/FLU/RSV testing.  Fact Sheet for Patients: BloggerCourse.com  Fact Sheet for Healthcare Providers: SeriousBroker.it  This test is not yet approved or cleared by the [place] FDA and has been authorized for detection and/or diagnosis of SARS-CoV-2 by FDA under an Emergency Use Authorization (EUA). This EUA will remain in effect (meaning this test can be used) for the duration of the COVID-19 declaration under Section 564(b)(1) of the Act, 21 U.S.C. section 360bbb-3(b)(1), unless the authorization is terminated or revoked.     Resp Syncytial Virus by PCR NEGATIVE NEGATIVE Final    Comment: (NOTE) Fact Sheet for Patients: BloggerCourse.com  Fact Sheet for Healthcare  Providers: SeriousBroker.it  This test is not yet approved or cleared by the [place] FDA and has been authorized for detection and/or diagnosis of SARS-CoV-2 by FDA under an Emergency Use Authorization (EUA). This EUA will remain in effect (meaning this test can be used) for the duration of the COVID-19 declaration under Section 564(b)(1) of the Act, 21 U.S.C. section 360bbb-3(b)(1), unless the authorization is terminated or revoked.  Performed at [facility], [address], [place], [place] [address]   Urine Culture     Status: None   Collection Time: 11/28/22  6:39 PM   Specimen: Urine, Random  Result Value Ref Range Status   Specimen Description   Final    URINE, RANDOM Performed at [facility], [address] [place], [place] [address]    Special Requests   Final    NONE Reflexed from  [id number] Performed at [facility], [address], [place], [place] [address]    Culture   Final    NO GROWTH Performed at [facility], [address] [place], [place] [address]    Report Status 11/30/2022 FINAL  Final  MRSA Next Gen by PCR, Nasal     Status: None   Collection Time: 11/28/22 10:28 PM   Specimen: Nasal Mucosa; Nasal Swab  Result Value Ref Range Status   MRSA by PCR Next Gen NOT DETECTED NOT DETECTED Final    Comment: (NOTE) The GeneXpert MRSA Assay (FDA approved for NASAL specimens only), is one component of a comprehensive MRSA colonization surveillance program. It is not intended to diagnose MRSA infection nor to guide or monitor treatment for MRSA infections. Test performance is not FDA approved in patients less than 92 years old. Performed at [facility], [address], [place], [place] [address]          Radiology Studies: No results found.      Scheduled Meds:  amLODipine  5 mg Oral Daily   feeding supplement  237 mL Oral TID BM   heparin injection (subcutaneous)   5,000 Units Subcutaneous Q8H   hydrALAZINE  10 mg Oral Q8H   mirabegron ER  25 mg Oral Daily   mirtazapine  15 mg Oral QHS   multivitamin with minerals  1 tablet Oral Daily   potassium chloride  40 mEq Oral Q4H   Continuous Infusions:     LOS: 9 days    Tresa Moore, MD [facility]   If 7PM-7AM, please contact night-coverage  12/07/2022, 10:56 AM

## 2022-12-07 NOTE — Consult Note (Signed)
PHARMACY CONSULT NOTE - ELECTROLYTES  Pharmacy Consult for Electrolyte Monitoring and Replacement   Recent Labs: Potassium (mmol/L)  Date Value  12/07/2022 3.0 (L)   Magnesium (mg/dL)  Date Value  40/98/1191 2.1   Calcium (mg/dL)  Date Value  47/82/9562 7.7 (L)   Albumin (g/dL)  Date Value  13/10/6576 2.0 (L)   Phosphorus (mg/dL)  Date Value  46/96/2952 4.0   Sodium (mmol/L)  Date Value  12/07/2022 141   Height: 5\' 6"  (167.6 cm) Weight: 61.1 kg (134 lb 11.2 oz) IBW/kg (Calculated) : 59.3 Estimated Creatinine Clearance: 57.8 mL/min (by C-G formula based on SCr of 0.75 mg/dL).  Assessment  Kristie Hoover is a 72 y.o. female presenting with AKI and bladder mass. PMH significant for DM, HTN, HLD, memory impairment. Pharmacy has been consulted to monitor and replace electrolytes.  Diet: Regular Pertinent medications: N/A  Goal of Therapy: Electrolytes within normal limits  Plan:  K+ 3.0. Give KCL PO x 2 doses Follow-up BMP tomorrow AM  Thank you for allowing pharmacy to be a part of this patient's care.  Erasmo Leventhal, PharmD Candidate 2025  12/07/2022 7:31 AM

## 2022-12-07 NOTE — Progress Notes (Signed)
Physical Therapy Treatment Patient Details Name: ATTALIA GRONAU MRN: [id number] DOB: 06/20/1950 Today's Date: 12/07/2022   History of Present Illness Pt is a 72 y.o. female with a PMHx of diabetes mellitus type 2, GERD, hiatal hernia, hypercholesterolemia, hypertension, degenerative disc disease, asthma, history of memory impairment and delusional disorder, who was admitted to [facility] on 11/28/2022 for evaluation of significant hypoglycemia, COVID-19 infection, acute kidney injury.    PT Comments  Pt resting in bed upon PT arrival; agreeable to therapy but requesting to toilet.  During session pt SBA with bed mobility; CGA to stand from bed but mod assist to stand from toilet; and CGA to ambulate 20 feet x2 (to/from bathroom) with RW use.  Pt required assist for safety during session d/t impaired cognition.  Pt declined further activity d/t feeling tired.  Will continue to focus on strengthening and progressive functional mobility during hospitalization.    If plan is discharge home, recommend the following: A little help with walking and/or transfers;A little help with bathing/dressing/bathroom;Assistance with cooking/housework;Direct supervision/assist for medications management;Direct supervision/assist for financial management;Assist for transportation;Help with stairs or ramp for entrance   Can travel by private vehicle        Equipment Recommendations  Rolling walker (2 wheels)    Recommendations for Other Services       Precautions / Restrictions Precautions Precautions: Fall Restrictions Weight Bearing Restrictions: No     Mobility  Bed Mobility Overal bed mobility: Needs Assistance Bed Mobility: Supine to Sit, Sit to Supine     Supine to sit: Supervision Sit to supine: Supervision   General bed mobility comments: increased time to perform on own    Transfers Overall transfer level: Needs assistance Equipment used: Rolling walker (2 wheels) Transfers: Sit to/from  Stand Sit to Stand: Contact guard assist, Mod assist           General transfer comment: vc's for UE placement; CGA to stand from bed; mod assist to stand from toilet    Ambulation/Gait Ambulation/Gait assistance: Contact guard assist Gait Distance (Feet):  (20 feet x2 (bed to/from bathroom)) Assistive device: Rolling walker (2 wheels) Gait Pattern/deviations: Step-through pattern, Decreased step length - right, Decreased step length - left, Narrow base of support Gait velocity: decreased     General Gait Details: vc's for safe walker use   Stairs             Wheelchair Mobility     Tilt Bed    Modified Rankin (Stroke Patients Only)       Balance Overall balance assessment: Needs assistance Sitting-balance support: No upper extremity supported, Feet supported Sitting balance-Leahy Scale: Good Sitting balance - Comments: steady reaching within BOS   Standing balance support: No upper extremity supported Standing balance-Leahy Scale: Good Standing balance comment: steady washing hands at sink                            Cognition Arousal: Alert Behavior During Therapy: WFL for tasks assessed/performed Overall Cognitive Status: No family/caregiver present to determine baseline cognitive functioning Area of Impairment: Orientation, Memory, Following commands, Problem solving                 Orientation Level: Disoriented to, Time   Memory: Decreased recall of precautions, Decreased short-term memory Following Commands: Follows one step commands consistently     Problem Solving: Slow processing, Requires verbal cues General Comments: Pt talking about needing to have a "lemon" multiple times during  session.  Impaired STM noted (pt appearing forgetful during session).        Exercises      General Comments  Pt agreeable to PT session.      Pertinent Vitals/Pain Pain Assessment Pain Assessment: No/denies pain Vitals (HR and SpO2 on  room air) stable and WFL throughout treatment session.    Home Living                          Prior Function            PT Goals (current goals can now be found in the care plan section) Acute Rehab PT Goals Patient Stated Goal: to feel better PT Goal Formulation: With patient Time For Goal Achievement: 12/14/22 Potential to Achieve Goals: Good Progress towards PT goals: Progressing toward goals    Frequency    Min 1X/week      PT Plan      Co-evaluation              AM-PAC PT "6 Clicks" Mobility   Outcome Measure  Help needed turning from your back to your side while in a flat bed without using bedrails?: A Little Help needed moving from lying on your back to sitting on the side of a flat bed without using bedrails?: A Little Help needed moving to and from a bed to a chair (including a wheelchair)?: A Little Help needed standing up from a chair using your arms (e.g., wheelchair or bedside chair)?: A Little Help needed to walk in hospital room?: A Little Help needed climbing 3-5 steps with a railing? : A Little 6 Click Score: 18    End of Session Equipment Utilized During Treatment: Gait belt Activity Tolerance: Patient tolerated treatment well Patient left: in bed;with call bell/phone within reach;with bed alarm set Nurse Communication: Mobility status;Precautions PT Visit Diagnosis: Other abnormalities of gait and mobility (R26.89);Muscle weakness (generalized) (M62.81);Difficulty in walking, not elsewhere classified (R26.2)     Time: 1914-7829 PT Time Calculation (min) (ACUTE ONLY): 20 min  Charges:    $Therapeutic Activity: 8-22 mins PT General Charges $$ ACUTE PT VISIT: 1 Visit                     Hendricks Limes, PT 12/07/22, 2:09 PM

## 2022-12-08 DIAGNOSIS — E11641 Type 2 diabetes mellitus with hypoglycemia with coma: Secondary | ICD-10-CM | POA: Diagnosis not present

## 2022-12-08 LAB — BASIC METABOLIC PANEL
Anion gap: 8 (ref 5–15)
BUN: 9 mg/dL (ref 8–23)
CO2: 26 mmol/L (ref 22–32)
Calcium: 8.3 mg/dL — ABNORMAL LOW (ref 8.9–10.3)
Chloride: 104 mmol/L (ref 98–111)
Creatinine, Ser: 0.85 mg/dL (ref 0.44–1.00)
GFR, Estimated: >60 mL/min (ref >60)
Glucose, Bld: 123 mg/dL — ABNORMAL HIGH (ref 70–99)
Potassium: 3.3 mmol/L — ABNORMAL LOW (ref 3.5–5.1)
Sodium: 138 mmol/L (ref 135–145)

## 2022-12-08 LAB — GLUCOSE, CAPILLARY
Glucose-Capillary: 151 mg/dL — ABNORMAL HIGH (ref 70–99)
Glucose-Capillary: 105 mg/dL — ABNORMAL HIGH (ref 70–99)
Glucose-Capillary: 128 mg/dL — ABNORMAL HIGH (ref 70–99)
Glucose-Capillary: 156 mg/dL — ABNORMAL HIGH (ref 70–99)

## 2022-12-08 MED ORDER — POTASSIUM CHLORIDE CRYS ER 20 MEQ PO TBCR
20.0000 meq | EXTENDED_RELEASE_TABLET | Freq: Two times a day (BID) | ORAL | Status: DC
  Administered 2022-12-08 – 2022-12-10 (×5): 20 meq via ORAL
  Filled 2022-12-08 (×5): qty 1

## 2022-12-08 NOTE — Progress Notes (Signed)
PROGRESS NOTE    Kristie Hoover  FAO:[id number] DOB: [birth year] DOA: 11/28/2022 PCP: [facility]    Brief Narrative:   Kristie Hoover is a 72 y.o. female with medical history significant for DM, HTN, HLD, recently evaluated by neurologist Dr. Malvin Johns on 11/22/2022 with a new diagnosis of memory impairment and delusional disorder after being evaluated for for 66-month history of progressive memory loss, delusions about property being stolen and concern for medication compliance.  She was started on risperidone. She was also seen in the emergency department by psychiatrist in August 2024 for confusion, paranoia, hearing voices and poor memory. She presented to the emergency department after she was found unresponsive at home.  Family had been trying to get in touch with her without any response.  When EMS arrived, she was given Narcan without any response.  She was then found to have a blood glucose level of 23.  She was given D10 with improvement in her glucose by the time she arrived in the emergency department.     In the ED, she was found to have AKI with creatinine of 6.56, hypokalemia with potassium of 2.5, hypomagnesemia with magnesium of 1.0.  Glucose level dropped again from 107-23 in the emergency department.  She had recurrent hypoglycemic episodes in the ED.  She was started on 10% dextrose infusion for recurrent hypoglycemia and IV normal saline infusion for AKI.  Renal ultrasound also revealed a large bladder mass.  9/19: Notified by RN that patient had increased urinary frequency.  Urinalysis bland.  Not retaining urine.  Discussed with urology.  Recommend Myrbetriq.  Suspect that increased urinary frequency secondary to bladder mass.  9/20: No acute events   Assessment & Plan:   Principal Problem:   Uncontrolled diabetes mellitus with hypoglycemic coma, without long-term current use of insulin (HCC) Active Problems:   AKI (acute kidney injury) (HCC)   High anion gap  metabolic acidosis   Hypokalemia   COVID-19 virus infection   Auditory hallucinations   Delusional disorder (HCC)   Hypocalcemia   HTN (hypertension)   Dementia without behavioral disturbance (HCC)   Asthma   Hypercholesterolemia   Anemia   Malnutrition of moderate degree   Type II DM with recurrent hypoglycemia Glucose levels have stabilized.  off of IV 10% dextrose infusion.  Last hemoglobin A1c 6 Plan: Regular diet as tolerated Encourage PO intake 3 times daily feeding supplements Discontinue glipizide/metformin at discharge Diet controlled diabetes     Acute kidney injury: Resolved. Creatinine is down from 6.95-6.15-4.20-2.55-1.31-0.96.      Hypokalemia, hypomagnesemia and hypocalcemia:  Likely dietary in nature.  Monitor and replace as necessary K3.09/20.  Ordered p.o. replacement 9/21: K 3.3 PO supplemement     Large bladder mass, increased urinary frequency:  Noted on 9/19 by bedside RN.   Urinalysis and bladder scan reassuring.   Plan: Continue Myrbetriq 25 mg daily Pure wick for patient safety Outpatient follow-up with urology Continue Myrbetriq 25 mg daily     Acute metabolic encephalopathy: Patient appears to be back to her baseline mental status but she has underlying dementia.     Auditory hallucinations, delusional disorder, dementia with behavioral disturbance: Continue supportive care.     Hypertension: Continue amlodipine and hydralazine.  Increase amlodipine from 2.5 mg to 5 mg daily.     COVID-19 infection: Asymptomatic.  She is tolerating room air.       General Weakness: PT recommended discharge to SNF.  Follow-up with social worker to assist  with disposition.     Other comorbidities include hypertension, asthma, hypercholesterolemia, chronic anemia   DVT prophylaxis: SQ heparin Code Status: Full Family Communication:Niece [phone] on 9/18; 9/20 Disposition Plan: Status is: Inpatient Remains inpatient appropriate because:  Awaiting placement medically stable   Level of care: Med-Surg  Consultants:  None  Procedures:  None  Antimicrobials: None   Subjective: No acute events  Objective: Vitals:   12/07/22 2135 12/08/22 0500 12/08/22 0513 12/08/22 0756  BP: 136/81  (!) 142/69 (!) 136/90  Pulse: 99  78 92  Resp: (!) 24  16 16   Temp: 99.5 F (37.5 C)  99.2 F (37.3 C) 99 F (37.2 C)  TempSrc: Oral  Oral   SpO2: 100%  97% 97%  Weight:  64.4 kg    Height:       No intake or output data in the 24 hours ending 12/08/22 1114 Filed Weights   12/06/22 0229 12/07/22 0446 12/08/22 0500  Weight: 66.2 kg 61.1 kg 64.4 kg    Examination:  General exam: NAD.  Appears frail Respiratory system: Clear lungs.  Normal work of breathing.  Room air Cardiovascular system: S1-S2, RRR, no murmurs Gastrointestinal system: Soft, NT/ND, normal bowel sounds Central nervous system: Alert oriented x 1, no focal deficits Extremities: Symmetric 5 x 5 power. Skin: No rashes, lesions or ulcers Psychiatry: Judgement and insight appear impaired. Mood & affect flattened.     Data Reviewed: I have personally reviewed following labs and imaging studies  CBC: Recent Labs  Lab 12/02/22 0337  WBC 8.5  HGB 10.8*  HCT 31.6*  MCV 82.7  PLT 297   Basic Metabolic Panel: Recent Labs  Lab 12/02/22 0337 12/03/22 0833 12/04/22 0604 12/05/22 0433 12/06/22 0433 12/06/22 1049 12/07/22 0529 12/08/22 0455  NA 141 145 146* 145  --  142 141 138  K 3.3* 3.6 3.6 2.7*  --  2.9* 3.0* 3.3*  CL 110 116* 114* 110  --  104 103 104  CO2 20* 21* 23 26  --  27 27 26   GLUCOSE 124* 130* 128* 120*  --  175* 134* 123*  BUN 46* 26* 17 9  --  6* 5* 9  CREATININE 2.55* 1.31* 0.96 0.77  --  0.89 0.75 0.85  CALCIUM 8.3* 8.3* 8.4* 7.9*  --  8.0* 7.7* 8.3*  MG 2.0 1.7  --   --  1.3*  --  2.1  --   PHOS 4.0  --   --   --   --   --   --   --    GFR: Estimated Creatinine Clearance: 54.4 mL/min (by C-G formula based on SCr of 0.85  mg/dL). Liver Function Tests: Recent Labs  Lab 12/02/22 0337  ALBUMIN 2.0*   No results for input(s): "LIPASE", "AMYLASE" in the last 168 hours. No results for input(s): "AMMONIA" in the last 168 hours.  Coagulation Profile: No results for input(s): "INR", "PROTIME" in the last 168 hours. Cardiac Enzymes: No results for input(s): "CKTOTAL", "CKMB", "CKMBINDEX", "TROPONINI" in the last 168 hours.  BNP (last 3 results) No results for input(s): "PROBNP" in the last 8760 hours. HbA1C: No results for input(s): "HGBA1C" in the last 72 hours. CBG: Recent Labs  Lab 12/06/22 2238 12/07/22 0752 12/07/22 1615 12/07/22 2137 12/08/22 0818  GLUCAP 129* 103* 193* 141* 151*   Lipid Profile: No results for input(s): "CHOL", "HDL", "LDLCALC", "TRIG", "CHOLHDL", "LDLDIRECT" in the last 72 hours. Thyroid Function Tests: No results for input(s): "TSH", "T4TOTAL", "FREET4", "  T3FREE", "THYROIDAB" in the last 72 hours. Anemia Panel: No results for input(s): "VITAMINB12", "FOLATE", "FERRITIN", "TIBC", "IRON", "RETICCTPCT" in the last 72 hours. Sepsis Labs: No results for input(s): "PROCALCITON", "LATICACIDVEN" in the last 168 hours.   Recent Results (from the past 240 hour(s))  Resp panel by RT-PCR (RSV, Flu A&B, Covid) Anterior Nasal Swab     Status: Abnormal   Collection Time: 11/28/22  6:12 PM   Specimen: Anterior Nasal Swab  Result Value Ref Range Status   SARS Coronavirus 2 by RT PCR POSITIVE (A) NEGATIVE Final    Comment: (NOTE) SARS-CoV-2 target nucleic acids are DETECTED.  The SARS-CoV-2 RNA is generally detectable in upper respiratory specimens during the acute phase of infection. Positive results are indicative of the presence of the identified virus, but do not rule out bacterial infection or co-infection with other pathogens not detected by the test. Clinical correlation with patient history and other diagnostic information is necessary to determine patient infection status.  The expected result is Negative.  Fact Sheet for Patients: BloggerCourse.com  Fact Sheet for Healthcare Providers: SeriousBroker.it  This test is not yet approved or cleared by the [place] FDA and  has been authorized for detection and/or diagnosis of SARS-CoV-2 by FDA under an Emergency Use Authorization (EUA).  This EUA will remain in effect (meaning this test can be used) for the duration of  the COVID-19 declaration under Section 564(b)(1) of the A ct, 21 U.S.C. section 360bbb-3(b)(1), unless the authorization is terminated or revoked sooner.     Influenza A by PCR NEGATIVE NEGATIVE Final   Influenza B by PCR NEGATIVE NEGATIVE Final    Comment: (NOTE) The Xpert Xpress SARS-CoV-2/FLU/RSV plus assay is intended as an aid in the diagnosis of influenza from Nasopharyngeal swab specimens and should not be used as a sole basis for treatment. Nasal washings and aspirates are unacceptable for Xpert Xpress SARS-CoV-2/FLU/RSV testing.  Fact Sheet for Patients: BloggerCourse.com  Fact Sheet for Healthcare Providers: SeriousBroker.it  This test is not yet approved or cleared by the [place] FDA and has been authorized for detection and/or diagnosis of SARS-CoV-2 by FDA under an Emergency Use Authorization (EUA). This EUA will remain in effect (meaning this test can be used) for the duration of the COVID-19 declaration under Section 564(b)(1) of the Act, 21 U.S.C. section 360bbb-3(b)(1), unless the authorization is terminated or revoked.     Resp Syncytial Virus by PCR NEGATIVE NEGATIVE Final    Comment: (NOTE) Fact Sheet for Patients: BloggerCourse.com  Fact Sheet for Healthcare Providers: SeriousBroker.it  This test is not yet approved or cleared by the [place] FDA and has been authorized for detection and/or  diagnosis of SARS-CoV-2 by FDA under an Emergency Use Authorization (EUA). This EUA will remain in effect (meaning this test can be used) for the duration of the COVID-19 declaration under Section 564(b)(1) of the Act, 21 U.S.C. section 360bbb-3(b)(1), unless the authorization is terminated or revoked.  Performed at [facility], [address], [place], [place] [address]   Urine Culture     Status: None   Collection Time: 11/28/22  6:39 PM   Specimen: Urine, Random  Result Value Ref Range Status   Specimen Description   Final    URINE, RANDOM Performed at [facility], [address], [place], [place] [address]    Special Requests   Final    NONE Reflexed from [id number] Performed at [facility], [address] [place] [place], [place] [address]  Culture   Final    NO GROWTH Performed at [facility], [address] [place], [place] [address]    Report Status 11/30/2022 FINAL  Final  MRSA Next Gen by PCR, Nasal     Status: None   Collection Time: 11/28/22 10:28 PM   Specimen: Nasal Mucosa; Nasal Swab  Result Value Ref Range Status   MRSA by PCR Next Gen NOT DETECTED NOT DETECTED Final    Comment: (NOTE) The GeneXpert MRSA Assay (FDA approved for NASAL specimens only), is one component of a comprehensive MRSA colonization surveillance program. It is not intended to diagnose MRSA infection nor to guide or monitor treatment for MRSA infections. Test performance is not FDA approved in patients less than 60 years old. Performed at [facility], [address] [place], [place] [address]          Radiology Studies: No results found.      Scheduled Meds:  amLODipine  5 mg Oral Daily   feeding supplement  237 mL Oral TID BM   heparin injection (subcutaneous)  5,000 Units Subcutaneous Q8H   hydrALAZINE  10 mg Oral Q8H   mirabegron ER  25 mg Oral Daily   mirtazapine  15 mg Oral QHS   multivitamin with minerals  1 tablet Oral  Daily   potassium chloride  20 mEq Oral BID   Continuous Infusions:     LOS: 10 days    Tresa Moore, MD [facility]   If 7PM-7AM, please contact night-coverage  12/08/2022, 11:14 AM

## 2022-12-08 NOTE — Progress Notes (Signed)
Insurance authorization approved in [place] for Monday [phone])

## 2022-12-08 NOTE — Consult Note (Signed)
PHARMACY CONSULT NOTE - ELECTROLYTES  Pharmacy Consult for Electrolyte Monitoring and Replacement   Recent Labs: Potassium (mmol/L)  Date Value  12/08/2022 3.3 (L)   Magnesium (mg/dL)  Date Value  44/05/4740 2.1   Calcium (mg/dL)  Date Value  59/56/3875 8.3 (L)   Albumin (g/dL)  Date Value  64/33/2951 2.0 (L)   Phosphorus (mg/dL)  Date Value  88/41/6606 4.0   Sodium (mmol/L)  Date Value  12/08/2022 138   Height: 5\' 6"  (167.6 cm) Weight: 64.4 kg (141 lb 15.6 oz) IBW/kg (Calculated) : 59.3 Estimated Creatinine Clearance: 54.4 mL/min (by C-G formula based on SCr of 0.85 mg/dL).  Assessment  Kristie Hoover is a 72 y.o. female presenting with AKI and bladder mass. PMH significant for DM, HTN, HLD, memory impairment. Pharmacy has been consulted to monitor and replace electrolytes.  Diet: Regular Pertinent medications: PTA: KCL 20 mEQ BID  Goal of Therapy: Electrolytes within normal limits  Plan:  K+ 3.0 > 3.3 s/p  KCL PO yesterday on 9/20 X 7 urine occurrences and x 2 stool occurrences documented for 9/20 Will reorder patient's PTA home regimen of KCL 20 mEQ BID Follow-up BMP tomorrow AM  Thank you for allowing pharmacy to be a part of this patient's care.  Sharen Hones, PharmD, BCPS Clinical Pharmacist   12/08/2022 7:28 AM

## 2022-12-09 DIAGNOSIS — E11641 Type 2 diabetes mellitus with hypoglycemia with coma: Secondary | ICD-10-CM | POA: Diagnosis not present

## 2022-12-09 LAB — GLUCOSE, CAPILLARY
Glucose-Capillary: 140 mg/dL — ABNORMAL HIGH (ref 70–99)
Glucose-Capillary: 169 mg/dL — ABNORMAL HIGH (ref 70–99)
Glucose-Capillary: 136 mg/dL — ABNORMAL HIGH (ref 70–99)
Glucose-Capillary: 133 mg/dL — ABNORMAL HIGH (ref 70–99)

## 2022-12-09 LAB — BASIC METABOLIC PANEL
Anion gap: 9 (ref 5–15)
BUN: 9 mg/dL (ref 8–23)
CO2: 25 mmol/L (ref 22–32)
Calcium: 8.1 mg/dL — ABNORMAL LOW (ref 8.9–10.3)
Chloride: 102 mmol/L (ref 98–111)
Creatinine, Ser: 0.79 mg/dL (ref 0.44–1.00)
GFR, Estimated: >60 mL/min (ref >60)
Glucose, Bld: 145 mg/dL — ABNORMAL HIGH (ref 70–99)
Potassium: 3.4 mmol/L — ABNORMAL LOW (ref 3.5–5.1)
Sodium: 136 mmol/L (ref 135–145)

## 2022-12-09 MED ORDER — POTASSIUM CHLORIDE CRYS ER 20 MEQ PO TBCR
20.0000 meq | EXTENDED_RELEASE_TABLET | Freq: Once | ORAL | Status: DC
  Filled 2022-12-09: qty 1

## 2022-12-09 NOTE — Progress Notes (Signed)
PROGRESS NOTE    [name]  WJX:[id number] DOB: [birth year] DOA: 11/28/2022 PCP: [facility]    Brief Narrative:   Kristie Hoover is a 72 y.o. female with medical history significant for DM, HTN, HLD, recently evaluated by neurologist Dr. Malvin Johns on 11/22/2022 with a new diagnosis of memory impairment and delusional disorder after being evaluated for for 49-month history of progressive memory loss, delusions about property being stolen and concern for medication compliance.  She was started on risperidone. She was also seen in the emergency department by psychiatrist in August 2024 for confusion, paranoia, hearing voices and poor memory. She presented to the emergency department after she was found unresponsive at home.  Family had been trying to get in touch with her without any response.  When EMS arrived, she was given Narcan without any response.  She was then found to have a blood glucose level of 23.  She was given D10 with improvement in her glucose by the time she arrived in the emergency department.     In the ED, she was found to have AKI with creatinine of 6.56, hypokalemia with potassium of 2.5, hypomagnesemia with magnesium of 1.0.  Glucose level dropped again from 107-23 in the emergency department.  She had recurrent hypoglycemic episodes in the ED.  She was started on 10% dextrose infusion for recurrent hypoglycemia and IV normal saline infusion for AKI.  Renal ultrasound also revealed a large bladder mass.  9/19: Notified by RN that patient had increased urinary frequency.  Urinalysis bland.  Not retaining urine.  Discussed with urology.  Recommend Myrbetriq.  Suspect that increased urinary frequency secondary to bladder mass.  9/20: No acute events   Assessment & Plan:   Principal Problem:   Uncontrolled diabetes mellitus with hypoglycemic coma, without long-term current use of insulin (HCC) Active Problems:   AKI (acute kidney injury) (HCC)   High anion gap  metabolic acidosis   Hypokalemia   COVID-19 virus infection   Auditory hallucinations   Delusional disorder (HCC)   Hypocalcemia   HTN (hypertension)   Dementia without behavioral disturbance (HCC)   Asthma   Hypercholesterolemia   Anemia   Malnutrition of moderate degree   Type II DM with recurrent hypoglycemia Glucose levels have stabilized.  off of IV 10% dextrose infusion.  Last hemoglobin A1c 6 Plan: Regular diet as tolerated Encourage PO intake 3 times daily feeding supplements Discontinue glipizide/metformin at discharge Diet controlled diabetes Medically ready for dc     Acute kidney injury: Resolved. Creatinine is down from 6.95-6.15-4.20-2.55-1.31-0.96.      Hypokalemia, hypomagnesemia and hypocalcemia:  Likely dietary in nature.  Monitor and replace as necessary K3.09/20.  Ordered p.o. replacement 9/21: K 3.3 PO supplemement 9/22: K 3.4.  PO supplement     Large bladder mass, increased urinary frequency:  Noted on 9/19 by bedside RN.   Urinalysis and bladder scan reassuring.   Plan: Continue Myrbetriq 25 mg daily Pure wick for patient safety Outpatient follow-up with urology Continue Myrbetriq 25 mg daily     Acute metabolic encephalopathy: Patient appears to be back to her baseline mental status but she has underlying dementia.     Auditory hallucinations, delusional disorder, dementia with behavioral disturbance: Continue supportive care.     Hypertension: Continue amlodipine and hydralazine.  Increase amlodipine from 2.5 mg to 5 mg daily.     COVID-19 infection: Asymptomatic.  She is tolerating room air.       General Weakness: PT recommended  discharge to SNF.  Follow-up with social worker to assist with disposition.     Other comorbidities include hypertension, asthma, hypercholesterolemia, chronic anemia   DVT prophylaxis: SQ heparin Code Status: Full Family Communication:Niece [phone] on 9/18; 9/20 Disposition Plan: Status is:  Inpatient Remains inpatient appropriate because: Awaiting placement medically stable   Level of care: Med-Surg  Consultants:  None  Procedures:  None  Antimicrobials: None   Subjective: No overnight events   Objective: Vitals:   12/08/22 2051 12/09/22 0252 12/09/22 0437 12/09/22 0755  BP: 135/70  (!) 145/68 138/84  Pulse: 87  82 85  Resp: 20  20 16   Temp: 98.4 F (36.9 C)  98.8 F (37.1 C) 98.8 F (37.1 C)  TempSrc: Oral     SpO2: 99%  99% 100%  Weight:  65.7 kg    Height:        Intake/Output Summary (Last 24 hours) at 12/09/2022 1128 Last data filed at 12/09/2022 1051 Gross per 24 hour  Intake 360 ml  Output --  Net 360 ml   Filed Weights   12/07/22 0446 12/08/22 0500 12/09/22 0252  Weight: 61.1 kg 64.4 kg 65.7 kg    Examination:  General exam: NAD.  Frail-appearing.  Pleasantly confused Respiratory system: Clear lungs.  Normal work of breathing.  Room air Cardiovascular system: S1-S2, RRR, no murmurs Gastrointestinal system: Soft, NT/ND, normal bowel sounds Central nervous system: Alert oriented x 1, no focal deficits Extremities: Symmetric 5 x 5 power. Skin: No rashes, lesions or ulcers Psychiatry: Judgement and insight appear impaired. Mood & affect confused.     Data Reviewed: I have personally reviewed following labs and imaging studies  CBC: No results for input(s): "WBC", "NEUTROABS", "HGB", "HCT", "MCV", "PLT" in the last 168 hours.  Basic Metabolic Panel: Recent Labs  Lab 12/03/22 0833 12/04/22 0604 12/05/22 0433 12/06/22 0433 12/06/22 1049 12/07/22 0529 12/08/22 0455 12/09/22 0435  NA 145   < > 145  --  142 141 138 136  K 3.6   < > 2.7*  --  2.9* 3.0* 3.3* 3.4*  CL 116*   < > 110  --  104 103 104 102  CO2 21*   < > 26  --  27 27 26 25   GLUCOSE 130*   < > 120*  --  175* 134* 123* 145*  BUN 26*   < > 9  --  6* 5* 9 9  CREATININE 1.31*   < > 0.77  --  0.89 0.75 0.85 0.79  CALCIUM 8.3*   < > 7.9*  --  8.0* 7.7* 8.3* 8.1*  MG  1.7  --   --  1.3*  --  2.1  --   --    < > = values in this interval not displayed.   GFR: Estimated Creatinine Clearance: 57.8 mL/min (by C-G formula based on SCr of 0.79 mg/dL). Liver Function Tests: No results for input(s): "AST", "ALT", "ALKPHOS", "BILITOT", "PROT", "ALBUMIN" in the last 168 hours.  No results for input(s): "LIPASE", "AMYLASE" in the last 168 hours. No results for input(s): "AMMONIA" in the last 168 hours.  Coagulation Profile: No results for input(s): "INR", "PROTIME" in the last 168 hours. Cardiac Enzymes: No results for input(s): "CKTOTAL", "CKMB", "CKMBINDEX", "TROPONINI" in the last 168 hours.  BNP (last 3 results) No results for input(s): "PROBNP" in the last 8760 hours. HbA1C: No results for input(s): "HGBA1C" in the last 72 hours. CBG: Recent Labs  Lab 12/08/22 0818 12/08/22 1214  12/08/22 1629 12/08/22 2049 12/09/22 0758  GLUCAP 151* 105* 128* 156* 140*   Lipid Profile: No results for input(s): "CHOL", "HDL", "LDLCALC", "TRIG", "CHOLHDL", "LDLDIRECT" in the last 72 hours. Thyroid Function Tests: No results for input(s): "TSH", "T4TOTAL", "FREET4", "T3FREE", "THYROIDAB" in the last 72 hours. Anemia Panel: No results for input(s): "VITAMINB12", "FOLATE", "FERRITIN", "TIBC", "IRON", "RETICCTPCT" in the last 72 hours. Sepsis Labs: No results for input(s): "PROCALCITON", "LATICACIDVEN" in the last 168 hours.   No results found for this or any previous visit (from the past 240 hour(s)).        Radiology Studies: No results found.      Scheduled Meds:  amLODipine  5 mg Oral Daily   feeding supplement  237 mL Oral TID BM   heparin injection (subcutaneous)  5,000 Units Subcutaneous Q8H   hydrALAZINE  10 mg Oral Q8H   mirabegron ER  25 mg Oral Daily   mirtazapine  15 mg Oral QHS   multivitamin with minerals  1 tablet Oral Daily   potassium chloride  20 mEq Oral BID   potassium chloride  20 mEq Oral Once   Continuous  Infusions:     LOS: 11 days    Tresa Moore, MD [facility]   If 7PM-7AM, please contact night-coverage  12/09/2022, 11:28 AM

## 2022-12-09 NOTE — Consult Note (Signed)
PHARMACY CONSULT NOTE - ELECTROLYTES  Pharmacy Consult for Electrolyte Monitoring and Replacement   Recent Labs: Potassium (mmol/L)  Date Value  12/09/2022 3.4 (L)   Magnesium (mg/dL)  Date Value  54/11/8117 2.1   Calcium (mg/dL)  Date Value  14/78/2956 8.1 (L)   Albumin (g/dL)  Date Value  21/30/8657 2.0 (L)   Phosphorus (mg/dL)  Date Value  84/69/6295 4.0   Sodium (mmol/L)  Date Value  12/09/2022 136   Height: 5\' 6"  (167.6 cm) Weight: 65.7 kg (144 lb 13.5 oz) IBW/kg (Calculated) : 59.3 Estimated Creatinine Clearance: 57.8 mL/min (by C-G formula based on SCr of 0.79 mg/dL).  Assessment  Kristie Hoover is a 72 y.o. female presenting with AKI and bladder mass. PMH significant for DM, HTN, HLD, memory impairment. Pharmacy has been consulted to monitor and replace electrolytes.  Diet: Regular Pertinent medications: PTA: KCL 20 mEQ BID - resumed 9/21  Goal of Therapy: Electrolytes within normal limits  Plan:  K+ 3.4  X 14 urine occurrences documented for 9/21 Continue patient's PTA home regimen of KCL 20 mEQ BID Give additional 20 mEq PO x 1 this morning Follow-up BMP tomorrow AM  Thank you for allowing pharmacy to be a part of this patient's care.  Sharen Hones, PharmD, BCPS Clinical Pharmacist   12/09/2022 7:32 AM

## 2022-12-10 DIAGNOSIS — E11641 Type 2 diabetes mellitus with hypoglycemia with coma: Secondary | ICD-10-CM | POA: Diagnosis not present

## 2022-12-10 LAB — BASIC METABOLIC PANEL WITH GFR
Anion gap: 8 (ref 5–15)
BUN: 10 mg/dL (ref 8–23)
CO2: 26 mmol/L (ref 22–32)
Calcium: 8.6 mg/dL — ABNORMAL LOW (ref 8.9–10.3)
Chloride: 104 mmol/L (ref 98–111)
Creatinine, Ser: 0.79 mg/dL (ref 0.44–1.00)
GFR, Estimated: >60 mL/min
Glucose, Bld: 153 mg/dL — ABNORMAL HIGH (ref 70–99)
Potassium: 3.6 mmol/L (ref 3.5–5.1)
Sodium: 138 mmol/L (ref 135–145)

## 2022-12-10 LAB — GLUCOSE, CAPILLARY
Glucose-Capillary: 144 mg/dL — ABNORMAL HIGH (ref 70–99)
Glucose-Capillary: 191 mg/dL — ABNORMAL HIGH (ref 70–99)
Glucose-Capillary: 102 mg/dL — ABNORMAL HIGH (ref 70–99)

## 2022-12-10 MED ORDER — AMLODIPINE BESYLATE 5 MG PO TABS: 5.0000 mg | ORAL_TABLET | Freq: Every day | ORAL | 0 refills | Status: AC

## 2022-12-10 MED ORDER — MIRTAZAPINE 15 MG PO TABS: 15.0000 mg | ORAL_TABLET | Freq: Every day | ORAL | 0 refills | Status: AC

## 2022-12-10 MED ORDER — ENSURE ENLIVE PO LIQD: 237.0000 mL | Freq: Three times a day (TID) | ORAL | 12 refills | Status: AC

## 2022-12-10 MED ORDER — MIRABEGRON ER 25 MG PO TB24: 25.0000 mg | ORAL_TABLET | Freq: Every day | ORAL | 0 refills | Status: AC

## 2022-12-10 MED ORDER — HYDRALAZINE HCL 10 MG PO TABS: 10.0000 mg | ORAL_TABLET | Freq: Three times a day (TID) | ORAL | 0 refills | Status: DC

## 2022-12-10 NOTE — Consult Note (Signed)
PHARMACY CONSULT NOTE - ELECTROLYTES  Pharmacy Consult for Electrolyte Monitoring and Replacement   Recent Labs: Potassium (mmol/L)  Date Value  12/10/2022 3.6   Magnesium (mg/dL)  Date Value  16/12/9602 2.1   Calcium (mg/dL)  Date Value  54/11/8117 8.6 (L)   Albumin (g/dL)  Date Value  14/78/2956 2.0 (L)   Phosphorus (mg/dL)  Date Value  21/30/8657 4.0   Sodium (mmol/L)  Date Value  12/10/2022 138   Height: 5\' 6"  (167.6 cm) Weight: 63.6 kg (140 lb 3.4 oz) IBW/kg (Calculated) : 59.3 Estimated Creatinine Clearance: 57.8 mL/min (by C-G formula based on SCr of 0.79 mg/dL).  Assessment  Kristie Hoover is a 72 y.o. female presenting with AKI and bladder mass. PMH significant for DM, HTN, HLD, memory impairment. Pharmacy has been consulted to monitor and replace electrolytes.  Diet: Regular Pertinent medications: PTA: KCL 20 mEQ BID - resumed 9/21  Goal of Therapy: Electrolytes within normal limits  Plan:  Continue patient's PTA home regimen of KCL 20 mEQ BID No additional electrolyte replacement warranted today Follow-up BMP tomorrow AM  Thank you for involving pharmacy in this patient's care.   Rockwell Alexandria, PharmD Clinical Pharmacist 12/10/2022 8:08 AM

## 2022-12-10 NOTE — Progress Notes (Signed)
Patient discharged via EMS to [facility] health care. IV removed and all belongings sent with patient. Discharge education and report given to crystal at [facility] health care.

## 2022-12-10 NOTE — Care Management Important Message (Signed)
Important Message  Patient Details  Name: Kristie Hoover MRN: [id number] Date of Birth: 08-17-1950   Medicare Important Message Given:  Yes     Olegario Messier A Alvaretta Eisenberger 12/10/2022, 11:15 AM

## 2022-12-10 NOTE — TOC Transition Note (Signed)
Transition of Care [facility]) - CM/SW Discharge Note   Patient Details  Name: Kristie Hoover MRN: [id number] Date of Birth: [birth year]  [facility] CM/SW Contact:  Allena Katz, LCSW Phone Number: 12/10/2022, 8:52 AM   Clinical Narrative:   Pt to discharge to [facility] today. RN given number for report. DC summary to be sent once in.    Final next level of care: Skilled Nursing Facility Barriers to Discharge: Barriers Resolved   Patient Goals and CMS Choice CMS Medicare.gov Compare Post Acute Care list provided to:: Patient Represenative (must comment) (niece) Choice offered to / list presented to : [facility] POA / Guardian  Discharge Placement                Patient chooses bed at: [facility] Patient to be transferred to facility by: [facility] Name of family member notified: Niece Patient and family notified of of transfer: 12/10/22  Discharge Plan and Services Additional resources added to the After Visit Summary for                                       Social Determinants of Health (SDOH) Interventions SDOH Screenings   Food Insecurity: Patient Unable To Answer (11/28/2022)  Housing: Patient Unable To Answer (11/28/2022)  Transportation Needs: Patient Unable To Answer (11/28/2022)  Utilities: Patient Unable To Answer (11/28/2022)  Financial Resource Strain: Low Risk  (11/22/2022)   Received from [facility]  Tobacco Use: Low Risk  (12/03/2022)     Readmission Risk Interventions     No data to display

## 2022-12-10 NOTE — Discharge Summary (Signed)
Physician Discharge Summary  Kristie Hoover:[id number] DOB: 12-Jun-1950 DOA: 11/28/2022  PCP: [facility]  Admit date: 11/28/2022 Discharge date: 12/10/2022  Admitted From: Home Disposition:  SNF  Recommendations for Outpatient Follow-up:  Follow up with PCP in 1-2 weeks Follow up with urology 1-2 weeks  Home Health:No  Equipment/Devices:None  Discharge Condition:Stable  CODE STATUS:FULL  Diet recommendation: Regular  Brief/Interim Summary: Kristie Hoover is a 72 y.o. female with medical history significant for DM, HTN, HLD, recently evaluated by neurologist Dr. Malvin Johns on 11/22/2022 with a new diagnosis of memory impairment and delusional disorder after being evaluated for for 78-month history of progressive memory loss, delusions about property being stolen and concern for medication compliance.  She was started on risperidone. She was also seen in the emergency department by psychiatrist in August 2024 for confusion, paranoia, hearing voices and poor memory. She presented to the emergency department after she was found unresponsive at home.  Family had been trying to get in touch with her without any response.  When EMS arrived, she was given Narcan without any response.  She was then found to have a blood glucose level of 23.  She was given D10 with improvement in her glucose by the time she arrived in the emergency department.     In the ED, she was found to have AKI with creatinine of 6.56, hypokalemia with potassium of 2.5, hypomagnesemia with magnesium of 1.0.  Glucose level dropped again from 107-23 in the emergency department.  She had recurrent hypoglycemic episodes in the ED.  She was started on 10% dextrose infusion for recurrent hypoglycemia and IV normal saline infusion for AKI.  Renal ultrasound also revealed a large bladder mass.   9/19: Notified by RN that patient had increased urinary frequency.  Urinalysis bland.  Not retaining urine.  Discussed with urology.   Recommend Myrbetriq.  Suspect that increased urinary frequency secondary to bladder mass.   9/20: No acute events 9/23: Stable for dc    Discharge Diagnoses:  Principal Problem:   Uncontrolled diabetes mellitus with hypoglycemic coma, without long-term current use of insulin (HCC) Active Problems:   AKI (acute kidney injury) (HCC)   High anion gap metabolic acidosis   Hypokalemia   COVID-19 virus infection   Auditory hallucinations   Delusional disorder (HCC)   Hypocalcemia   HTN (hypertension)   Dementia without behavioral disturbance (HCC)   Asthma   Hypercholesterolemia   Anemia   Malnutrition of moderate degree Type II DM with recurrent hypoglycemia Glucose levels have stabilized.  off of IV 10% dextrose infusion.  Last hemoglobin A1c 6 Plan: Regular diet as tolerated Encourage PO intake 3 times daily feeding supplements Discontinue glipizide/metformin at discharge Diet controlled diabetes      Acute kidney injury: Resolved. Creatinine is down from 6.95-6.15-4.20-2.55-1.31-0.96.      Hypokalemia, hypomagnesemia and hypocalcemia:  Likely dietary in nature.  Monitor and replace as necessary K3.09/20.  Ordered p.o. replacement 9/21: K 3.3 PO supplemement 9/22: K 3.4.  PO supplement     Large bladder mass, increased urinary frequency:  Noted on 9/19 by bedside RN.   Urinalysis and bladder scan reassuring.   Plan: Continue Myrbetriq 25 mg daily Outpatient follow-up with urology, Dr. Apolinar Junes      Acute metabolic encephalopathy: Patient appears to be back to her baseline mental status but she has underlying dementia.     Auditory hallucinations, delusional disorder, dementia with behavioral disturbance: Continue supportive care.     Hypertension: Continue  amlodipine and hydralazine.  Increase amlodipine from 2.5 mg to 5 mg daily.     COVID-19 infection: Asymptomatic.  She is tolerating room air.       General Weakness: PT recommended discharge to SNF.   Follow-up with social worker to assist with disposition.     Other comorbidities include hypertension, asthma, hypercholesterolemia, chronic anemia   Discharge Instructions  Discharge Instructions     Diet - low sodium heart healthy   Complete by: As directed    Increase activity slowly   Complete by: As directed       Allergies as of 12/10/2022       Reactions   Oxycodone-acetaminophen    Hallucinations    Prednisone Hives   Tramadol Other (See Comments)   Hallucinations   Chlorhexidine Gluconate Itching   Chg soap   Erythromycin Rash        Medication List     STOP taking these medications    acetaminophen 500 MG tablet Commonly known as: TYLENOL   aspirin EC 81 MG tablet   cyclobenzaprine 10 MG tablet Commonly known as: FLEXERIL   etodolac 400 MG tablet Commonly known as: LODINE   furosemide 40 MG tablet Commonly known as: LASIX   glipiZIDE-metformin 2.5-500 MG tablet Commonly known as: METAGLIP   Ilevro 0.3 % ophthalmic suspension Generic drug: nepafenac   Rhopressa 0.02 % Soln Generic drug: Netarsudil Dimesylate   risperiDONE 0.25 MG tablet Commonly known as: RISPERDAL       TAKE these medications    amLODipine 5 MG tablet Commonly known as: NORVASC Take 1 tablet (5 mg total) by mouth daily. Start taking on: December 11, 2022   Biotin 1000 MCG Chew Chew 2,000 mcg by mouth daily.   feeding supplement Liqd Take 237 mLs by mouth 3 (three) times daily between meals.   Fluticasone-Salmeterol 100-50 MCG/DOSE Aepb Commonly known as: ADVAIR Inhale 1 puff into the lungs in the morning and at bedtime. Inhale 1 puff twice daily may take a third puff at bedtime as needed for shortness of breath   hydrALAZINE 10 MG tablet Commonly known as: APRESOLINE Take 1 tablet (10 mg total) by mouth every 8 (eight) hours.   latanoprost 0.005 % ophthalmic solution Commonly known as: XALATAN Place 1 drop into both eyes at bedtime.   mirabegron ER  25 MG Tb24 tablet Commonly known as: MYRBETRIQ Take 1 tablet (25 mg total) by mouth daily. Start taking on: December 11, 2022   mirtazapine 15 MG tablet Commonly known as: REMERON Take 1 tablet (15 mg total) by mouth at bedtime.   montelukast 10 MG tablet Commonly known as: SINGULAIR Take 10 mg by mouth every morning.   multivitamin with minerals Tabs tablet Take 1 tablet by mouth daily.   pantoprazole 40 MG tablet Commonly known as: PROTONIX Take 40 mg by mouth every morning.   potassium chloride SA 20 MEQ tablet Commonly known as: KLOR-CON M Take 20 mEq by mouth 2 (two) times daily.   pravastatin 40 MG tablet Commonly known as: PRAVACHOL Take 40 mg by mouth every morning.        Contact information for after-discharge care     Destination     [facility] .   Service: Skilled Nursing Contact information: [address] [place] [address] [phone]                    Allergies  Allergen Reactions   Oxycodone-Acetaminophen  Hallucinations    Prednisone Hives   Tramadol Other (See Comments)    Hallucinations   Chlorhexidine Gluconate Itching    Chg soap   Erythromycin Rash    Consultations: Nephrology Urology   Procedures/Studies: US RENAL  Result Date: 11/28/2022 CLINICAL DATA:  Acute renal failure. EXAM: RENAL / URINARY TRACT ULTRASOUND COMPLETE COMPARISON:  None Available. FINDINGS: Right Kidney: Renal measurements: 9.5 cm x 4.8 cm x 6.3 cm = volume: 147.91 mL. Echogenicity within normal limits. No mass or hydronephrosis visualized. Left Kidney: Renal measurements: 10.5 cm x 6.8 cm x 5.8 cm = volume: 216.16 mL. Echogenicity within normal limits. No mass or hydronephrosis visualized. Bladder: A 5.0 cm x 5.8 cm x 5.7 cm heterogeneous hypoechoic mass is seen within the posterior aspect of the urinary bladder. Areas of flow are seen within this region on color Doppler evaluation. Other: Limited study  secondary to patient motion and patient altered mental status. IMPRESSION: 1. Large, heterogeneous posterior urinary bladder soft tissue mass, concerning for the presence of an underlying neoplasm. MRI correlation is recommended. Electronically Signed   By: Aram Candela M.D.   On: 11/28/2022 21:46   DG Chest Portable 1 View  Result Date: 11/28/2022 CLINICAL DATA:  Altered mental status, productive cough EXAM: PORTABLE CHEST 1 VIEW COMPARISON:  08/03/2011 FINDINGS: Cardiac and mediastinal contours are within normal limits. Aortic atherosclerosis. No focal pulmonary opacity. No pleural effusion or pneumothorax. No acute osseous abnormality. Status post interval left total shoulder arthroplasty. IMPRESSION: No acute cardiopulmonary process. Electronically Signed   By: Wiliam Ke M.D.   On: 11/28/2022 19:47   CT Head Wo Contrast  Result Date: 11/28/2022 CLINICAL DATA:  Altered mental status. EXAM: CT HEAD WITHOUT CONTRAST TECHNIQUE: Contiguous axial images were obtained from the base of the skull through the vertex without intravenous contrast. RADIATION DOSE REDUCTION: This exam was performed according to the departmental dose-optimization program which includes automated exposure control, adjustment of the mA and/or kV according to patient size and/or use of iterative reconstruction technique. COMPARISON:  Head CT dated 11/01/2022. FINDINGS: Brain: The ventricles and sulci are appropriate size for the patient's age. The gray-Estrada matter discrimination is preserved. There is no acute intracranial hemorrhage. No mass effect or midline shift. No extra-axial fluid collection. Vascular: No hyperdense vessel or unexpected calcification. Skull: Normal. Negative for fracture or focal lesion. Sinuses/Orbits: No acute finding. Other: None IMPRESSION: No acute intracranial pathology. Electronically Signed   By: Elgie Collard M.D.   On: 11/28/2022 19:37      Subjective: Seen and examined on day of  discharge.  Stable and appropriate for DC to SNF.  Discharge Exam: Vitals:   12/10/22 0431 12/10/22 0737  BP: 130/66 136/66  Pulse: 85 85  Resp: 18 16  Temp: 98.7 F (37.1 C) 98.6 F (37 C)  SpO2: 95% 100%   Vitals:   12/09/22 2046 12/10/22 0431 12/10/22 0500 12/10/22 0737  BP: (!) 111/56 130/66  136/66  Pulse: (!) 102 85  85  Resp: 20 18  16   Temp: 99.6 F (37.6 C) 98.7 F (37.1 C)  98.6 F (37 C)  TempSrc: Oral   Oral  SpO2: 96% 95%  100%  Weight:   63.6 kg   Height:        General: Pt is alert, awake, not in acute distress Cardiovascular: RRR, S1/S2 +, no rubs, no gallops Respiratory: CTA bilaterally, no wheezing, no rhonchi Abdominal: Soft, NT, ND, bowel sounds + Extremities: no edema, no cyanosis  The results of significant diagnostics from this hospitalization (including imaging, microbiology, ancillary and laboratory) are listed below for reference.     Microbiology: No results found for this or any previous visit (from the past 240 hour(s)).   Labs: BNP (last 3 results) No results for input(s): "BNP" in the last 8760 hours. Basic Metabolic Panel: Recent Labs  Lab 12/06/22 0433 12/06/22 1049 12/07/22 0529 12/08/22 0455 12/09/22 0435 12/10/22 0453  NA  --  142 141 138 136 138  K  --  2.9* 3.0* 3.3* 3.4* 3.6  CL  --  104 103 104 102 104  CO2  --  27 27 26 25 26   GLUCOSE  --  175* 134* 123* 145* 153*  BUN  --  6* 5* 9 9 10   CREATININE  --  0.89 0.75 0.85 0.79 0.79  CALCIUM  --  8.0* 7.7* 8.3* 8.1* 8.6*  MG 1.3*  --  2.1  --   --   --    Liver Function Tests: No results for input(s): "AST", "ALT", "ALKPHOS", "BILITOT", "PROT", "ALBUMIN" in the last 168 hours. No results for input(s): "LIPASE", "AMYLASE" in the last 168 hours. No results for input(s): "AMMONIA" in the last 168 hours. CBC: No results for input(s): "WBC", "NEUTROABS", "HGB", "HCT", "MCV", "PLT" in the last 168 hours. Cardiac Enzymes: No results for input(s): "CKTOTAL", "CKMB",  "CKMBINDEX", "TROPONINI" in the last 168 hours. BNP: Invalid input(s): "POCBNP" CBG: Recent Labs  Lab 12/09/22 0758 12/09/22 1156 12/09/22 1626 12/09/22 2142 12/10/22 0737  GLUCAP 140* 169* 136* 133* 144*   D-Dimer No results for input(s): "DDIMER" in the last 72 hours. Hgb A1c No results for input(s): "HGBA1C" in the last 72 hours. Lipid Profile No results for input(s): "CHOL", "HDL", "LDLCALC", "TRIG", "CHOLHDL", "LDLDIRECT" in the last 72 hours. Thyroid function studies No results for input(s): "TSH", "T4TOTAL", "T3FREE", "THYROIDAB" in the last 72 hours.  Invalid input(s): "FREET3" Anemia work up No results for input(s): "VITAMINB12", "FOLATE", "FERRITIN", "TIBC", "IRON", "RETICCTPCT" in the last 72 hours. Urinalysis    Component Value Date/Time   COLORURINE COLORLESS (A) 12/06/2022 0815   APPEARANCEUR CLEAR (A) 12/06/2022 0815   LABSPEC 1.003 (L) 12/06/2022 0815   PHURINE 8.0 12/06/2022 0815   GLUCOSEU NEGATIVE 12/06/2022 0815   HGBUR SMALL (A) 12/06/2022 0815   BILIRUBINUR NEGATIVE 12/06/2022 0815   KETONESUR NEGATIVE 12/06/2022 0815   PROTEINUR NEGATIVE 12/06/2022 0815   NITRITE NEGATIVE 12/06/2022 0815   LEUKOCYTESUR NEGATIVE 12/06/2022 0815   Sepsis Labs No results for input(s): "WBC" in the last 168 hours.  Invalid input(s): "PROCALCITONIN", "LACTICIDVEN" Microbiology No results found for this or any previous visit (from the past 240 hour(s)).   Time coordinating discharge: Over 30 minutes  SIGNED:   Tresa Moore, MD  [facility] 12/10/2022, 10:16 AM Pager   If 7PM-7AM, please contact night-coverage

## 2023-01-01 ENCOUNTER — Other Ambulatory Visit: Payer: 59 | Admitting: Urology

## 2023-01-23 ENCOUNTER — Other Ambulatory Visit: Payer: 59 | Admitting: Urology

## 2023-02-20 ENCOUNTER — Other Ambulatory Visit: Payer: 59 | Admitting: Urology

## 2023-02-20 DIAGNOSIS — N3289 Other specified disorders of bladder: Secondary | ICD-10-CM

## 2023-04-09 ENCOUNTER — Other Ambulatory Visit: Payer: 59 | Admitting: Urology

## 2023-04-16 ENCOUNTER — Ambulatory Visit: Payer: 59 | Admitting: Urology

## 2023-04-16 VITALS — BP 156/70 | HR 66 | Ht 66.0 in | Wt 141.0 lb

## 2023-04-16 DIAGNOSIS — N3289 Other specified disorders of bladder: Secondary | ICD-10-CM

## 2023-04-16 DIAGNOSIS — R1900 Intra-abdominal and pelvic swelling, mass and lump, unspecified site: Secondary | ICD-10-CM

## 2023-04-16 LAB — MICROSCOPIC EXAMINATION

## 2023-04-16 LAB — URINALYSIS, COMPLETE
Bilirubin, UA: NEGATIVE
Glucose, UA: NEGATIVE
Ketones, UA: NEGATIVE
Nitrite, UA: NEGATIVE
Protein,UA: NEGATIVE
Specific Gravity, UA: 1.02 (ref 1.005–1.030)
Urobilinogen, Ur: 0.2 mg/dL (ref 0.2–1.0)
pH, UA: 7 (ref 5.0–7.5)

## 2023-04-16 NOTE — Progress Notes (Signed)
   04/16/23  CC:  Chief Complaint  Patient presents with   Cysto    HPI: 73 year old female who presents today for cystoscopy.  Notably, she was seen during inpatient admission in 11/2022 at which time she was admitted for acute renal insufficiency.  She had no evidence of outlet obstruction but there was questionable bladder mass.  She was scheduled for outpatient cystoscopy and has no showed or rescheduled multiple appointments since that time.  She is accompanied today by family members.  She is living at [facility].  She denies any urinary issues including dysuria or gross hematuria.  Blood pressure (!) 156/70, pulse 66, height 5\' 6"  (1.676 m), weight 141 lb (64 kg). NED. A&Ox3.   No respiratory distress   Abd soft, NT, ND Normal external genitalia with patent urethral meatus.  After cystoscopy was complete, pelvic exam was performed which was somewhat limited however there is no obvious vaginal masses and bimanual exam is also fairly unremarkable.  Chaperoned by CMA [facility].  Cystoscopy Procedure Note  Patient identification was confirmed, informed consent was obtained, and patient was prepped using Betadine solution.  Lidocaine jelly was administered per urethral meatus.    Procedure: - Flexible cystoscope introduced, without any difficulty.   - Thorough search of the bladder revealed:    normal urethral meatus    The majority of the bladder was unremarkable.  However at the trigone and bladder neck posteriorly, there was a infiltrative masslike aspect possibly arising from outside of the bladder at the mucosa itself was fairly unremarkable without any sort of papillary change.  Bilateral ureteral orifice ease were somewhat displaced but visible.  Post-Procedure: - Patient tolerated the procedure well  Assessment/ Plan:  1. Bladder mass (Primary) Somewhat suspicious today on cystoscopy but this is arising outside of the bladder based on cystoscopic findings  today.    The discussion today with the patient and family concerns suspicious for neoplastic process.  Agree with radiologist, plan for MRI of the pelvis for diagnostic purposes.  If this does appear to be arising from the bladder, we will proceed to the operating room for TURBT.  Patient and her family are agreeable with this plan. - Urinalysis, Complete - MR Pelvis W Wo Contrast; Future  2. Pelvic mass As above - MR Pelvis W Wo Contrast; Future   F/u after pelvic MRI  Vanna Scotland, MD

## 2023-04-30 ENCOUNTER — Ambulatory Visit
Admission: RE | Admit: 2023-04-30 | Discharge: 2023-04-30 | Disposition: A | Discharge status: Home / Self Care | Payer: 59 | Source: Ambulatory Visit | Attending: Urology | Admitting: Urology

## 2023-04-30 DIAGNOSIS — R1900 Intra-abdominal and pelvic swelling, mass and lump, unspecified site: Secondary | ICD-10-CM

## 2023-04-30 DIAGNOSIS — N3289 Other specified disorders of bladder: Secondary | ICD-10-CM | POA: Diagnosis present

## 2023-04-30 MED ORDER — GADOBUTROL 1 MMOL/ML IV SOLN
6.0000 mL | Freq: Once | INTRAVENOUS | Status: AC | PRN
  Administered 2023-04-30: 6 mL via INTRAVENOUS

## 2023-05-15 ENCOUNTER — Telehealth: Payer: Self-pay

## 2023-05-15 DIAGNOSIS — R1900 Intra-abdominal and pelvic swelling, mass and lump, unspecified site: Secondary | ICD-10-CM

## 2023-05-15 NOTE — Telephone Encounter (Signed)
 Spoke with Kristie Hoover and at her request called her niece, Jerldine. We reviewed MRI findings and reason for gyn oncology referral and need for possible vaginal cuff biopsy if mass seen on pelvic exam. We have arranged an appointment 05/22/23 at 1115. Provided directions to the cancer center. All questions answered.

## 2023-05-22 ENCOUNTER — Inpatient Hospital Stay: Payer: 59

## 2023-05-27 ENCOUNTER — Telehealth: Payer: Self-pay | Admitting: Urology

## 2023-05-27 NOTE — Telephone Encounter (Signed)
 Thanks for checking.  No, they do not need to come but it is very important to keep their follow-up on Wednesday and Gyn Onc.  Denyse Amass, please cancel the appointment and let them know.  Vanna Scotland, MD

## 2023-05-27 NOTE — Telephone Encounter (Signed)
 Pt Niece called wanting to know if she still needs to bring Kristie Hoover to her appt tomorrow? She says that they have already received the results of the MRI and going to GYN ONC on Wednesday for the biopsy.

## 2023-05-28 ENCOUNTER — Ambulatory Visit: Payer: Self-pay | Admitting: Urology

## 2023-05-29 ENCOUNTER — Other Ambulatory Visit
Admission: RE | Admit: 2023-05-29 | Discharge: 2023-05-29 | Disposition: A | Discharge status: Home / Self Care | Source: Ambulatory Visit | Attending: Nurse Practitioner | Admitting: Nurse Practitioner

## 2023-05-29 ENCOUNTER — Inpatient Hospital Stay: Attending: Obstetrics and Gynecology | Admitting: Obstetrics and Gynecology

## 2023-05-29 VITALS — BP 117/67 | HR 65 | Temp 98.6°F | Resp 20 | Wt 143.1 lb

## 2023-05-29 DIAGNOSIS — Z803 Family history of malignant neoplasm of breast: Secondary | ICD-10-CM | POA: Diagnosis not present

## 2023-05-29 DIAGNOSIS — F0392 Unspecified dementia, unspecified severity, with psychotic disturbance: Secondary | ICD-10-CM | POA: Diagnosis not present

## 2023-05-29 DIAGNOSIS — Z888 Allergy status to other drugs, medicaments and biological substances status: Secondary | ICD-10-CM | POA: Insufficient documentation

## 2023-05-29 DIAGNOSIS — Z90710 Acquired absence of both cervix and uterus: Secondary | ICD-10-CM | POA: Diagnosis not present

## 2023-05-29 DIAGNOSIS — Z881 Allergy status to other antibiotic agents status: Secondary | ICD-10-CM | POA: Insufficient documentation

## 2023-05-29 DIAGNOSIS — Z885 Allergy status to narcotic agent status: Secondary | ICD-10-CM | POA: Insufficient documentation

## 2023-05-29 DIAGNOSIS — Z9049 Acquired absence of other specified parts of digestive tract: Secondary | ICD-10-CM | POA: Insufficient documentation

## 2023-05-29 DIAGNOSIS — R440 Auditory hallucinations: Secondary | ICD-10-CM | POA: Diagnosis not present

## 2023-05-29 DIAGNOSIS — Z79899 Other long term (current) drug therapy: Secondary | ICD-10-CM | POA: Insufficient documentation

## 2023-05-29 DIAGNOSIS — R1900 Intra-abdominal and pelvic swelling, mass and lump, unspecified site: Secondary | ICD-10-CM | POA: Insufficient documentation

## 2023-05-29 DIAGNOSIS — E7800 Pure hypercholesterolemia, unspecified: Secondary | ICD-10-CM | POA: Diagnosis not present

## 2023-05-29 DIAGNOSIS — I10 Essential (primary) hypertension: Secondary | ICD-10-CM | POA: Insufficient documentation

## 2023-05-29 DIAGNOSIS — Z8616 Personal history of COVID-19: Secondary | ICD-10-CM | POA: Diagnosis not present

## 2023-05-29 DIAGNOSIS — E119 Type 2 diabetes mellitus without complications: Secondary | ICD-10-CM | POA: Diagnosis not present

## 2023-05-29 DIAGNOSIS — K5730 Diverticulosis of large intestine without perforation or abscess without bleeding: Secondary | ICD-10-CM | POA: Insufficient documentation

## 2023-05-29 DIAGNOSIS — K648 Other hemorrhoids: Secondary | ICD-10-CM | POA: Diagnosis not present

## 2023-05-29 DIAGNOSIS — D398 Neoplasm of uncertain behavior of other specified female genital organs: Secondary | ICD-10-CM | POA: Insufficient documentation

## 2023-05-29 DIAGNOSIS — E876 Hypokalemia: Secondary | ICD-10-CM | POA: Diagnosis not present

## 2023-05-29 DIAGNOSIS — J45909 Unspecified asthma, uncomplicated: Secondary | ICD-10-CM | POA: Insufficient documentation

## 2023-05-29 DIAGNOSIS — N179 Acute kidney failure, unspecified: Secondary | ICD-10-CM | POA: Insufficient documentation

## 2023-05-29 NOTE — Progress Notes (Addendum)
 Gynecologic Oncology Consult Visit   Referring Provider: Dr. Ace Holder  Chief Complaint: Bladder vs Pelvic Mass Subjective:  Kristie Hoover is a 73 y.o. P0G0 female who is seen in consultation from Dr. Ace Holder for possible pelvic versus bladder mass.  Patient has medical history significant for diabetes, hypertension, hyperlipidemia, memory impairment and delusional disorder, who currently resides at skilled nursing facility.  She initially presented to ER in September 2024 after being found unresponsive at her home.  She was found to have a blood glucose level of 23 and creatinine of 6.56, potassium 2.5.  She had recurrent hypoglycemic episodes in the ER.  Renal ultrasound found large bladder mass measuring 5 x 5.8 x 5.7 heterogeneous hypoechoic thought to originate in the posterior aspect of the urinary bladder.   She was referred to urology but rescheduled multiple times. Eventually she was seen by Dr. Ace Holder cystoscopy on 04/16/2023 with majority of bladder being unremarkable. However, the trigone and bladder posterior lesion there was an infiltrative masslike aspect possibly arising from the outside of the bladder.  The mucosa itself was fairly unremarkable without any sort of papillary change.  Bilateral ureteral orifice were somewhat displaced but visible.  She underwent a pelvic exam that was somewhat limited did but no obvious vaginal masses markable.  04/30/2023-MR pelvis with without contrast FINDINGS: Lower Urinary Tract: A bulky soft tissue mass is seen which involves the posterior wall of the urinary bladder. This measures 5.2 x 3.6 by 4.6 cm, and shows a central necrotic area with a small amount of she had female with a air. the anterior wall of the sigmoid colon superiorly. Bowel: The mass described above involves the anterior wall of the sigmoid colon. Severe sigmoid diverticulosis seen, without signs of diverticulitis. Vascular/Lymphatic: No pathologically enlarged lymph nodes or other  significant abnormality. Reproductive: Prior hysterectomy. The mass described above also appears to involve the vaginal cuff posteriorly. Both ovaries are normal in appearance. No evidence of free fluid. Other: None.  Musculoskeletal: No suspicious bone lesions identified.   IMPRESSION: - 5.2 cm bulky soft tissue mass involving the posterior wall of the urinary bladder and vaginal cuff. This is highly suspicious for malignancy, and differential diagnosis includes vaginal or cervical carcinoma, and bladder carcinoma. - This mass also involves the inferior wall of the sigmoid colon. - No evidence of pelvic lymphadenopathy or other metastatic disease. - Severe sigmoid diverticulosis, without signs of diverticulitis.  She presents today for consultation and possible biopsy and further workup.  She denies vaginal bleeding or spotting or discharge.  She had a hysterectomy for heavy bleeding many years ago.  Denies urinary incontinence or changes in bowel movements.  Says she has a history of diverticulitis.  She does have chronic memory changes and currently resides at [facility] which is memory care assisted living facility.  She is accompanied by her niece today.  Last colonoscopy 08/2017- Dr Felicita Horns- diverticulosis, internal hemorrhoids but otherwise normal. Recommend repeating in 2029.  Last EGD 08/19/17 revealed mild schatzki ring, dilated. Normal otherwise.   Problem List: Patient Active Problem List   Diagnosis Date Noted   Malnutrition of moderate degree 11/29/2022   HTN (hypertension) 11/28/2022   Asthma 11/28/2022   Uncontrolled diabetes mellitus with hypoglycemic coma, without long-term current use of insulin  (HCC) 11/28/2022   AKI (acute kidney injury) (HCC) 11/28/2022   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   Dementia without behavioral disturbance (HCC) 11/28/2022   High anion gap metabolic acidosis 11/28/2022   Hypokalemia 11/28/2022   Hypocalcemia  11/28/2022   Anemia 11/28/2022    COVID-19 virus infection 11/28/2022   Hypercholesterolemia    Acute confusional state 10/31/2022   Auditory hallucinations 10/31/2022   Status post reverse total shoulder replacement, 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   Status post reverse arthroplasty of shoulder, 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   S/P anterior colporrhaphy 04/18/2020   Rectocele    Cystocele, midline 02/15/2020    Past Medical History: Past Medical History:  Diagnosis Date   Anemia    Arthritis    Asthma    WELL CONTROLLED   DDD (degenerative disc disease), lumbar    Diabetes mellitus without complication (HCC)    GERD (gastroesophageal reflux disease)    History of hiatal hernia    Hypercholesterolemia    Hypertension    NO MEDS   Kidney infection    Pneumonia    @ 40 months old   Wears dentures    full upper, partial lower    Past Surgical History: Past Surgical History:  Procedure Laterality Date   ABDOMINAL HYSTERECTOMY     APPENDECTOMY     CARPAL TUNNEL RELEASE Right    CATARACT EXTRACTION W/PHACO Right 08/15/2021   Procedure: CATARACT EXTRACTION PHACO AND INTRAOCULAR LENS PLACEMENT (IOC) RIGHT DIABETIC;  Surgeon: Clair Crews, MD;  Location: [facility];  Service: Ophthalmology;  Laterality: Right;  6.40 0:49.8   CATARACT EXTRACTION W/PHACO Left 08/29/2021   Procedure: CATARACT EXTRACTION PHACO AND INTRAOCULAR LENS PLACEMENT (IOC) LEFT DIABETIC 8.21 00:52.9;  Surgeon: Clair Crews, MD;  Location: [facility];  Service: Ophthalmology;  Laterality: Left;  Diabetic   COLONOSCOPY     COLONOSCOPY WITH PROPOFOL  N/A 08/19/2017   Procedure: COLONOSCOPY WITH PROPOFOL ;  Surgeon: Cassie Click, MD;  Location: [facility];  Service: Endoscopy;  Laterality: N/A;   CYSTOCELE REPAIR N/A 03/31/2020   Procedure: ANTERIOR REPAIR (CYSTOCELE);  Surgeon: Alben Alma, MD;  Location: [facility];  Service: Gynecology;  Laterality: N/A;   ESOPHAGOGASTRODUODENOSCOPY     ESOPHAGOGASTRODUODENOSCOPY (EGD) WITH PROPOFOL  N/A  08/19/2017   Procedure: ESOPHAGOGASTRODUODENOSCOPY (EGD) WITH PROPOFOL ;  Surgeon: Cassie Click, MD;  Location[facility];  Service: Endoscopy;  Laterality: N/A;   LAMINECTOMY  1998   NO METAL   RECTOCELE REPAIR N/A 03/31/2020   Procedure: POSTERIOR REPAIR (RECTOCELE);  Surgeon: Alben Alma, MD;  Location: [facility];  Service: Gynecology;  Laterality: N/A;   REVERSE SHOULDER ARTHROPLASTY Left 08/11/2020   Procedure: REVERSE SHOULDER ARTHROPLASTY;  Surgeon: Elner Hahn, MD;  Location: [facility];  Service: Orthopedics;  Laterality: Left;   WRIST FUSION Right 1993   METAL PLATE AND SCREWS HAVE BEEN REMOVED    Past Gynecologic History:  Post menopausal Hysterectomy in 1983 for heavy bleeding Sexually active: denies  OB History:  OB History  Gravida Para Term Preterm AB Living  0 0 0 0 0 0  SAB IAB Ectopic Multiple Live Births  0 0 0 0 0    Family History: Family History  Problem Relation Age of Onset   Breast cancer Other     Social History: Social History   Socioeconomic History   Marital status: Single    Spouse name: Not on file   Number of children: Not on file   Years of education: Not on file   Highest education level: Not on file  Occupational History   Occupation: warehouse (socks) industry  Tobacco Use   Smoking status: Never   Smokeless tobacco: Never  Vaping Use   Vaping status: Never Used  Substance and  Sexual Activity   Alcohol use: Never   Drug use: Never   Sexual activity: Not Currently  Other Topics Concern   Not on file  Social History Narrative   Patient lives alone in an apartment building.   Feels safe in her home.    Social Drivers of Corporate investment banker Strain: Low Risk  (11/22/2022)   Received from [facility]   Overall Financial Resource Strain (CARDIA)    Difficulty of Paying Living Expenses: Not hard at all  Food Insecurity: Patient Unable To Answer (11/28/2022)   Hunger Vital Sign    Worried About  Running Out of Food in the Last Year: Patient unable to answer    Ran Out of Food in the Last Year: Patient unable to answer  Transportation Needs: Patient Unable To Answer (11/28/2022)   PRAPARE - Transportation    Lack of Transportation (Medical): Patient unable to answer    Lack of Transportation (Non-Medical): Patient unable to answer  Physical Activity: Not on file  Stress: Not on file  Social Connections: Not on file  Intimate Partner Violence: Patient Unable To Answer (11/28/2022)   Humiliation, Afraid, Rape, and Kick questionnaire    Fear of Current or Ex-Partner: Patient unable to answer    Emotionally Abused: Patient unable to answer    Physically Abused: Patient unable to answer    Sexually Abused: Patient unable to answer    Allergies: Allergies  Allergen Reactions   Oxycodone -Acetaminophen      Hallucinations    Prednisone Hives   Tramadol Other (See Comments)    Hallucinations   Chlorhexidine  Gluconate Itching    Chg soap   Erythromycin Rash    Current Medications: Current Outpatient Medications  Medication Sig Dispense Refill   amLODipine  (NORVASC ) 5 MG tablet Take 1 tablet (5 mg total) by mouth daily. 30 tablet 0   atorvastatin (LIPITOR) 10 MG tablet Take 10 mg by mouth daily.     divalproex (DEPAKOTE) 125 MG DR tablet Take by mouth.     feeding supplement (ENSURE ENLIVE / ENSURE PLUS) LIQD Take 237 mLs by mouth 3 (three) times daily between meals. 237 mL 12   Ferrous Sulfate (IRON PO) Take by mouth.     latanoprost  (XALATAN ) 0.005 % ophthalmic solution Place 1 drop into both eyes at bedtime.     melatonin 5 MG TABS Take 5 mg by mouth in the morning and at bedtime.     mirabegron  ER (MYRBETRIQ ) 25 MG TB24 tablet Take 1 tablet (25 mg total) by mouth daily. 30 tablet 0   mirtazapine  (REMERON ) 15 MG tablet Take 1 tablet (15 mg total) by mouth at bedtime. 30 tablet 0   montelukast  (SINGULAIR ) 10 MG tablet Take 10 mg by mouth every morning.     Multiple Vitamin  (MULTIVITAMIN WITH MINERALS) TABS tablet Take 1 tablet by mouth daily.     pantoprazole  (PROTONIX ) 40 MG tablet Take 40 mg by mouth every morning.     potassium chloride  SA (K-DUR,KLOR-CON ) 20 MEQ tablet Take 20 mEq by mouth 2 (two) times daily.     No current facility-administered medications for this visit.    Review of Systems General: negative for fevers, changes in weight or night sweats Skin: negative for changes in moles or sores or rash Eyes: negative for changes in vision HEENT: negative for change in hearing, tinnitus, voice changes Pulmonary: negative for dyspnea, orthopnea, productive cough, wheezing Cardiac: negative for palpitations, pain Gastrointestinal: negative for nausea, vomiting,  constipation, diarrhea, hematemesis, hematochezia Genitourinary/Sexual: negative for dysuria, retention, hematuria, incontinence Ob/Gyn:  negative for abnormal bleeding, or pain Musculoskeletal: negative for pain, joint pain, back pain Hematology: negative for easy bruising, abnormal bleeding Neurologic/Psych: negative for headaches, seizures, paralysis, weakness, numbness  Objective:  Physical Examination:  BP 117/67   Pulse 65   Temp 98.6 F (37 C)   Resp 20   Wt 143 lb 1.6 oz (64.9 kg)   SpO2 100%   BMI 23.10 kg/m     ECOG Performance Status: 2 - Symptomatic, <50% confined to bed  General appearance: cooperative and appears stated age HEENT: mucus membranes moist no mucosal lesions posterior pharynx benign PERRL EOMI sclera clear Neck: normal Lymph node survey: non-palpable, axillary, inguinal, supraclavicular Cardiovascular: without murmurs, rubs or gallops Respiratory: clear to auscultation Abdomen: no palpable masses, no hernias, well healed incision, soft, nontender, nondistended, bowel sounds present, and without hepatosplenomegaly Back: inspection of back is normal Extremities: no lower extremity edema Skin exam - normal coloration and turgor, no rashes, no  suspicious skin lesions noted. Neurological exam reveals alert, oriented, normal speech, no focal findings or movement disorder noted.  Pelvic: Exam chaperoned by CMA EGBUS: no lesions Vagina: normal Bimanual/Rectovaginal: 6 cm mass in cul de sac, not invading vagina. The mass is fairly smooth and not nodular.  Lab Review Labs on site today: Lab Results  Component Value Date   WBC 8.5 12/02/2022   HGB 10.8 (L) 12/02/2022   HCT 31.6 (L) 12/02/2022   MCV 82.7 12/02/2022   PLT 297 12/02/2022     Chemistry      Component Value Date/Time   NA 138 12/10/2022 0453   K 3.6 12/10/2022 0453   CL 104 12/10/2022 0453   CO2 26 12/10/2022 0453   BUN 10 12/10/2022 0453   CREATININE 0.79 12/10/2022 0453      Component Value Date/Time   CALCIUM  8.6 (L) 12/10/2022 0453   ALKPHOS 87 11/28/2022 1803   AST 21 11/28/2022 1803   ALT 11 11/28/2022 1803   BILITOT 1.1 11/28/2022 1803       Radiologic Imaging: MRI 04/30/23 FINDINGS: Lower Urinary Tract: A bulky soft tissue mass is seen which involves the posterior wall of the urinary bladder. This measures 5.2 x 3.6 by 4.6 cm, and shows a central necrotic area with a small amount of air. the anterior wall of the sigmoid colon superiorly.   Bowel: The mass described above involves the anterior wall of the sigmoid colon. Severe sigmoid diverticulosis seen, without signs of diverticulitis.   Vascular/Lymphatic: No pathologically enlarged lymph nodes or other significant abnormality.   Reproductive: Prior hysterectomy. The mass described above also appears to involve the vaginal cuff posteriorly. Both ovaries are normal in appearance. No evidence of free fluid.   Other: None.   Musculoskeletal: No suspicious bone lesions identified.   IMPRESSION: 5.2 cm bulky soft tissue mass involving the posterior wall of the urinary bladder and vaginal cuff. This is highly suspicious for malignancy, and differential diagnosis includes vaginal or  cervical carcinoma, and bladder carcinoma.   This mass also involves the inferior wall of the sigmoid colon.   No evidence of pelvic lymphadenopathy or other metastatic disease.   Severe sigmoid diverticulosis, without signs of diverticulitis.      Assessment:  Kristie Hoover is a 73 y.o. female diagnosed with 6 cm pelvic mass in cul de sac adjacent to bladder and vagina. Cystoscopy negative and no PMB. See details of work up to date  above. History of severe diverticulosis on imaging and bouts of diverticulitis.  Prior hysterectomy and normal ovaries seen on MRI.  Do not see a cervix on speculum exam, so do not think the cervix was left in place, but this is a possibility. This mass could be malignant, but it could be the sequelae of a prior diverticular abscess.    Lives in a nursing home.   Medical co-morbidities complicating care: HTN, Diabetes, AKI, mental confusion.  Plan:   Problem List Items Addressed This Visit   None Visit Diagnoses       Pelvic mass in female    -  Primary   Relevant Orders   NM PET Image Initial (PI) Skull Base To Thigh   IGP, Aptima HPV      We discussed options for management including observation, PET scan and biopsy by interventional radiology if lesion suspicious  PET scan done in view of concern for pelvic malignancy. Patient was in agreement with PET scan and this was scheduled.  I spoke to Dr Ace Holder in Urology and she is in agreement with the plan.   Will also order tumor markers, CA125, CEA and CA19-9.  Will also schedule colonoscopy at a later date, since five years from last study and she has severe diverticulosis.   Will contact her with results of the PET scan when available.    The patient's diagnosis, an outline of the further diagnostic and laboratory studies which will be required, the recommendation for surgery, and alternatives were discussed with her and her accompanying family members.  All questions were answered to their  satisfaction.  A total of 60 minutes were spent with the patient/family today; 50% was spent in education, counseling and coordination of care for pelvic mass.    Nelda Balsam, NP  I personally interviewed and examined the patient. Agreed with the above/below plan of care. I have directly contributed to assessment and plan of care of this patient and educated and discussed with patient and family.  Hermine Loots, MD    CC:  [facility] [address] [place],  [place] [address] [phone]

## 2023-05-30 ENCOUNTER — Other Ambulatory Visit: Payer: Self-pay | Admitting: Nurse Practitioner

## 2023-05-30 DIAGNOSIS — R1900 Intra-abdominal and pelvic swelling, mass and lump, unspecified site: Secondary | ICD-10-CM

## 2023-05-30 NOTE — Progress Notes (Unsigned)
 Tumor marker orders entered. Plan to add lab encounter when patient has PET.

## 2023-06-04 ENCOUNTER — Other Ambulatory Visit: Payer: Self-pay

## 2023-06-05 ENCOUNTER — Ambulatory Visit
Admission: RE | Admit: 2023-06-05 | Discharge: 2023-06-05 | Disposition: A | Discharge status: Home / Self Care | Source: Ambulatory Visit | Attending: Obstetrics and Gynecology | Admitting: Obstetrics and Gynecology

## 2023-06-05 ENCOUNTER — Inpatient Hospital Stay

## 2023-06-05 DIAGNOSIS — I700 Atherosclerosis of aorta: Secondary | ICD-10-CM | POA: Diagnosis not present

## 2023-06-05 DIAGNOSIS — E119 Type 2 diabetes mellitus without complications: Secondary | ICD-10-CM | POA: Diagnosis not present

## 2023-06-05 DIAGNOSIS — R1900 Intra-abdominal and pelvic swelling, mass and lump, unspecified site: Secondary | ICD-10-CM | POA: Diagnosis present

## 2023-06-05 LAB — GLUCOSE, CAPILLARY: Glucose-Capillary: 78 mg/dL (ref 70–99)

## 2023-06-05 MED ORDER — FLUDEOXYGLUCOSE F - 18 (FDG) INJECTION
7.6500 | Freq: Once | INTRAVENOUS | Status: AC | PRN
  Administered 2023-06-05: 7.65 via INTRAVENOUS

## 2023-06-06 LAB — CANCER ANTIGEN 19-9: CA 19-9: 26 U/mL (ref 0–35)

## 2023-06-06 LAB — CEA: CEA: 2.5 ng/mL (ref 0.0–4.7)

## 2023-06-06 LAB — CA 125: Cancer Antigen (CA) 125: 7.9 U/mL (ref 0.0–38.1)

## 2023-06-18 ENCOUNTER — Telehealth: Payer: Self-pay | Admitting: Nurse Practitioner

## 2023-06-18 ENCOUNTER — Telehealth: Payer: Self-pay | Admitting: *Deleted

## 2023-06-18 ENCOUNTER — Telehealth: Payer: Self-pay

## 2023-06-18 NOTE — Telephone Encounter (Signed)
 Spoke to Alberta, patient's niece, who accompanied patient to appointment with Dr Johnnette Litter. PET is pending official read. Tumor markers were not elevated. Reviewed that she is undergoing work up of pelvic mass and unable to determine if malignant or benign at this point. Also discussed palliative care vs hospice and what those services entail. Unclear if palliative care was consulted for her comorbidities vs pelvic mass. No diagnosis of malignancy currently. Jerldine will follow up with  House to ascertain more information. I also assisted with setting up mychart account.

## 2023-06-18 NOTE — Telephone Encounter (Signed)
 Wants a call back about the results of pet. But she also got info about 5.2 soft tissue  mass it is suspicious for cancer. The niece wants a call back with results, [facility] says she is in palliative. No one said that from GYN

## 2023-06-18 NOTE — Telephone Encounter (Signed)
 The niece called today and said that the place she is that MRI was progressed cancer and that she was put on palliative now.

## 2023-06-18 NOTE — Telephone Encounter (Signed)
 [facility] radiology called to have PET from 06/05/23 read.

## 2023-06-19 ENCOUNTER — Telehealth: Payer: Self-pay

## 2023-06-19 NOTE — Telephone Encounter (Signed)
 Second call placed to [facility] Radiology to have PET read.

## 2023-06-20 ENCOUNTER — Telehealth: Payer: Self-pay | Admitting: *Deleted

## 2023-06-20 ENCOUNTER — Telehealth: Payer: Self-pay | Admitting: Nurse Practitioner

## 2023-06-20 NOTE — Telephone Encounter (Signed)
 Spoke to [place] by phone. No apparent target for percutaneous biopsy per IR. I've communicated that to Dr Johnnette Litter and await recommendations.

## 2023-06-20 NOTE — Telephone Encounter (Signed)
 Niece  Jerldine lea to go over the scan. I told the lady that Lauren not here today but she will be here tom.  I will send her the message to have a call back

## 2023-06-21 NOTE — Telephone Encounter (Signed)
 Referral has been sent to [facility] for colonoscopy.

## 2023-06-26 ENCOUNTER — Telehealth: Payer: Self-pay

## 2023-06-26 NOTE — Telephone Encounter (Signed)
 Colonoscopy has been scheduled for 07/19/23. A sooner date was offered but due to transportation barriers she was not able to accept.

## 2023-07-11 ENCOUNTER — Encounter: Payer: Self-pay | Admitting: *Deleted

## 2023-07-17 ENCOUNTER — Emergency Department

## 2023-07-17 ENCOUNTER — Other Ambulatory Visit: Payer: Self-pay

## 2023-07-17 ENCOUNTER — Telehealth: Payer: Self-pay | Admitting: *Deleted

## 2023-07-17 ENCOUNTER — Emergency Department: Admission: EM | Admit: 2023-07-17 | Discharge: 2023-07-17 | Disposition: A | Discharge status: Home / Self Care

## 2023-07-17 DIAGNOSIS — K5730 Diverticulosis of large intestine without perforation or abscess without bleeding: Secondary | ICD-10-CM | POA: Insufficient documentation

## 2023-07-17 DIAGNOSIS — I10 Essential (primary) hypertension: Secondary | ICD-10-CM | POA: Diagnosis not present

## 2023-07-17 DIAGNOSIS — E119 Type 2 diabetes mellitus without complications: Secondary | ICD-10-CM | POA: Diagnosis not present

## 2023-07-17 DIAGNOSIS — M545 Low back pain, unspecified: Secondary | ICD-10-CM | POA: Diagnosis present

## 2023-07-17 LAB — URINALYSIS, ROUTINE W REFLEX MICROSCOPIC
Bilirubin Urine: NEGATIVE
Glucose, UA: NEGATIVE mg/dL
Hgb urine dipstick: NEGATIVE
Ketones, ur: NEGATIVE mg/dL
Nitrite: NEGATIVE
Protein, ur: NEGATIVE mg/dL
Specific Gravity, Urine: 1.018 (ref 1.005–1.030)
pH: 7 (ref 5.0–8.0)

## 2023-07-17 LAB — CBC WITH DIFFERENTIAL/PLATELET
Abs Immature Granulocytes: 0.02 10*3/uL (ref 0.00–0.07)
Basophils Absolute: 0 10*3/uL (ref 0.0–0.1)
Basophils Relative: 1 %
Eosinophils Absolute: 0.5 10*3/uL (ref 0.0–0.5)
Eosinophils Relative: 5 %
HCT: 40.7 % (ref 36.0–46.0)
Hemoglobin: 13 g/dL (ref 12.0–15.0)
Immature Granulocytes: 0 %
Lymphocytes Relative: 30 %
Lymphs Abs: 2.6 10*3/uL (ref 0.7–4.0)
MCH: 29.8 pg (ref 26.0–34.0)
MCHC: 31.9 g/dL (ref 30.0–36.0)
MCV: 93.3 fL (ref 80.0–100.0)
Monocytes Absolute: 0.5 10*3/uL (ref 0.1–1.0)
Monocytes Relative: 6 %
Neutro Abs: 5 10*3/uL (ref 1.7–7.7)
Neutrophils Relative %: 58 %
Platelets: 234 10*3/uL (ref 150–400)
RBC: 4.36 MIL/uL (ref 3.87–5.11)
RDW: 15.4 % (ref 11.5–15.5)
WBC: 8.7 10*3/uL (ref 4.0–10.5)
nRBC: 0 % (ref 0.0–0.2)

## 2023-07-17 LAB — COMPREHENSIVE METABOLIC PANEL WITH GFR
ALT: 16 U/L (ref 0–44)
AST: 21 U/L (ref 15–41)
Albumin: 4.1 g/dL (ref 3.5–5.0)
Alkaline Phosphatase: 95 U/L (ref 38–126)
Anion gap: 10 (ref 5–15)
BUN: 13 mg/dL (ref 8–23)
CO2: 27 mmol/L (ref 22–32)
Calcium: 9.3 mg/dL (ref 8.9–10.3)
Chloride: 103 mmol/L (ref 98–111)
Creatinine, Ser: 0.9 mg/dL (ref 0.44–1.00)
GFR, Estimated: >60 mL/min (ref >60)
Glucose, Bld: 82 mg/dL (ref 70–99)
Potassium: 3.8 mmol/L (ref 3.5–5.1)
Sodium: 140 mmol/L (ref 135–145)
Total Bilirubin: 0.5 mg/dL (ref 0.0–1.2)
Total Protein: 8.5 g/dL — ABNORMAL HIGH (ref 6.5–8.1)

## 2023-07-17 MED ORDER — IOHEXOL 300 MG/ML  SOLN
100.0000 mL | Freq: Once | INTRAMUSCULAR | Status: AC | PRN
  Administered 2023-07-17: 100 mL via INTRAVENOUS

## 2023-07-17 NOTE — ED Triage Notes (Signed)
 Pt arrives from [facility] for worsening right lower back pain x 2 weeks. Pt denies fall/injuries. Pt does not use any aids for ambulating. Pt says she hasn't had any trouble walking.

## 2023-07-17 NOTE — Telephone Encounter (Signed)
 Amber called and  wanted to be updated with the patient and the niece. She says they started to try a biopsy and then did not do it then they wanted a colonoscopy and it was originally put down for May 2 but the niece which is the power of attorney is saying that they have now moved out further out per the niece. Please call her [phone]. I still see 5/2 on what I see.

## 2023-07-17 NOTE — ED Notes (Signed)
 ED Provider at bedside.

## 2023-07-17 NOTE — ED Provider Notes (Signed)
 [facility] Provider Note    Event Date/Time   First MD Initiated Contact with Patient 07/17/23 1239     (approximate)   History   Back Pain   HPI  Kristie Hoover is a 73 y.o. female history of hypertension, diabetes, arthritis and a recent diagnosis of a pelvic mass presents emergency department with increasing lower back pain.  Patient states that it was hurting so bad she was crying earlier.  Ibuprofen did help.  Had a PET scan on 06/05/2023.  Showed uncertainty of exactly where the pelvic mass is if it is on the bladder or next to the sigmoid colon.  Patient does have colonoscopy scheduled with Dr. Emerick Hanlon.  Radiology had recommended a CT of the pelvis with contrast.  Due to the increasing back pain today patient presents emergency department with concerns.  Denies loss of bladder or bowel control.  No radiation to the legs.      Physical Exam   Triage Vital Signs: ED Triage Vitals  Encounter Vitals Group     BP 07/17/23 1210 137/65     Systolic BP Percentile --      Diastolic BP Percentile --      Pulse Rate 07/17/23 1210 62     Resp 07/17/23 1210 18     Temp 07/17/23 1210 98.4 F (36.9 C)     Temp Source 07/17/23 1210 Oral     SpO2 07/17/23 1210 100 %     Weight 07/17/23 1217 143 lb 4.8 oz (65 kg)     Height 07/17/23 1217 5\' 6"  (1.676 m)     Head Circumference --      Peak Flow --      Pain Score 07/17/23 1217 6     Pain Loc --      Pain Education --      Exclude from Growth Chart --     Most recent vital signs: Vitals:   07/17/23 1210 07/17/23 1430  BP: 137/65   Pulse: 62 67  Resp: 18   Temp: 98.4 F (36.9 C)   SpO2: 100% 100%     General: Awake, no distress.   CV:  Good peripheral perfusion.  Resp:  Normal effort.  Abd:  No distention.   Other:  Lumbar spine and sacrum tender to palpation, 5 or 5 strength lower extremities, patient is able to walk without difficulty   ED Results / Procedures / Treatments   Labs (all  labs ordered are listed, but only abnormal results are displayed) Labs Reviewed  COMPREHENSIVE METABOLIC PANEL WITH GFR - Abnormal; Notable for the following components:      Result Value   Total Protein 8.5 (*)    All other components within normal limits  URINALYSIS, ROUTINE W REFLEX MICROSCOPIC - Abnormal; Notable for the following components:   Color, Urine YELLOW (*)    APPearance CLEAR (*)    Leukocytes,Ua TRACE (*)    Bacteria, UA RARE (*)    All other components within normal limits  CBC WITH DIFFERENTIAL/PLATELET     EKG     RADIOLOGY CT abdomen pelvis IV contrast, no charge L-spine    PROCEDURES:   Procedures Chief Complaint  Patient presents with   Back Pain      MEDICATIONS ORDERED IN ED: Medications  iohexol  (OMNIPAQUE ) 300 MG/ML solution 100 mL (100 mLs Intravenous Contrast Given 07/17/23 1401)     IMPRESSION / MDM / ASSESSMENT AND PLAN / ED COURSE  I  reviewed the triage vital signs and the nursing notes.                              Differential diagnosis includes, but is not limited to, lumbar strain, fracture, metastatic disease, enlarging tumor  Patient's presentation is most consistent with acute illness / injury with system symptoms.   Due to the patient's history and PET scan results stating that she would need a CT of her pelvis with contrast we will go ahead and order abdomen pelvis contrast along with a no charge L-spine to evaluate spine.  Patient did take ibuprofen which gave her some relief.  Will do basic labs prior to CT.   Labs are reassuring, CT abdomen pelvis, I did independently review the images and radiology interpretation.  Radiologist comments that the vaginal cuff mass still present.  No metastatic disease and no mets to the lumbar spine.  I did explain these findings to the patient.  Since she had relief with her ibuprofen I encouraged her to take Tylenol  or ibuprofen for pain.  She will be discharged back to [facility].  She is to follow-up with her regular doctors.  In agreement treatment plan.  Discharged stable condition.   FINAL CLINICAL IMPRESSION(S) / ED DIAGNOSES   Final diagnoses:  Acute midline low back pain without sciatica     Rx / DC Orders   ED Discharge Orders     None        Note:  This document was prepared using Dragon voice recognition software and may include unintentional dictation errors.    Delsie Figures, PA-C 07/17/23 1702    Collis Deaner, MD 07/18/23 [phone]

## 2023-07-17 NOTE — ED Notes (Signed)
 Called patient family member who stated they would be here in the next 30  minutes after getting off work to pick her up and return her home RN spoke with Barbette Boom (Niece) [phone] [facility] Phone)

## 2023-07-19 NOTE — Telephone Encounter (Signed)
 Call returned to [facility] at facility. Spoke at length regarding.

## 2023-09-06 ENCOUNTER — Encounter: Payer: Self-pay | Admitting: *Deleted

## 2023-09-27 ENCOUNTER — Encounter
Admission: RE | Disposition: A | Discharge status: Home / Self Care | Payer: Self-pay | Source: Home / Self Care | Attending: Gastroenterology

## 2023-09-27 ENCOUNTER — Other Ambulatory Visit: Payer: Self-pay

## 2023-09-27 ENCOUNTER — Ambulatory Visit
Admission: RE | Admit: 2023-09-27 | Discharge: 2023-09-27 | Disposition: A | Discharge status: Home / Self Care | Attending: Gastroenterology | Admitting: Gastroenterology

## 2023-09-27 ENCOUNTER — Ambulatory Visit: Admitting: Anesthesiology

## 2023-09-27 DIAGNOSIS — E119 Type 2 diabetes mellitus without complications: Secondary | ICD-10-CM | POA: Diagnosis not present

## 2023-09-27 DIAGNOSIS — K5730 Diverticulosis of large intestine without perforation or abscess without bleeding: Secondary | ICD-10-CM | POA: Diagnosis not present

## 2023-09-27 DIAGNOSIS — K219 Gastro-esophageal reflux disease without esophagitis: Secondary | ICD-10-CM | POA: Diagnosis not present

## 2023-09-27 DIAGNOSIS — E7800 Pure hypercholesterolemia, unspecified: Secondary | ICD-10-CM | POA: Diagnosis not present

## 2023-09-27 DIAGNOSIS — R933 Abnormal findings on diagnostic imaging of other parts of digestive tract: Secondary | ICD-10-CM | POA: Diagnosis present

## 2023-09-27 DIAGNOSIS — Z90710 Acquired absence of both cervix and uterus: Secondary | ICD-10-CM | POA: Diagnosis not present

## 2023-09-27 DIAGNOSIS — I10 Essential (primary) hypertension: Secondary | ICD-10-CM | POA: Insufficient documentation

## 2023-09-27 DIAGNOSIS — D1779 Benign lipomatous neoplasm of other sites: Secondary | ICD-10-CM | POA: Insufficient documentation

## 2023-09-27 DIAGNOSIS — K449 Diaphragmatic hernia without obstruction or gangrene: Secondary | ICD-10-CM | POA: Diagnosis not present

## 2023-09-27 DIAGNOSIS — Z9049 Acquired absence of other specified parts of digestive tract: Secondary | ICD-10-CM | POA: Diagnosis not present

## 2023-09-27 DIAGNOSIS — Z79899 Other long term (current) drug therapy: Secondary | ICD-10-CM | POA: Diagnosis not present

## 2023-09-27 DIAGNOSIS — D649 Anemia, unspecified: Secondary | ICD-10-CM | POA: Diagnosis not present

## 2023-09-27 DIAGNOSIS — K640 First degree hemorrhoids: Secondary | ICD-10-CM | POA: Insufficient documentation

## 2023-09-27 HISTORY — PX: COLONOSCOPY: SHX5424

## 2023-09-27 HISTORY — DX: Carpal tunnel syndrome, left upper limb: G56.02

## 2023-09-27 HISTORY — DX: Unspecified dementia, unspecified severity, without behavioral disturbance, psychotic disturbance, mood disturbance, and anxiety: F03.90

## 2023-09-27 LAB — GLUCOSE, CAPILLARY: Glucose-Capillary: 79 mg/dL (ref 70–99)

## 2023-09-27 SURGERY — COLONOSCOPY
Anesthesia: General

## 2023-09-27 MED ORDER — PROPOFOL 10 MG/ML IV BOLUS
INTRAVENOUS | Status: DC | PRN
  Administered 2023-09-27: 60 mg via INTRAVENOUS

## 2023-09-27 MED ORDER — SODIUM CHLORIDE 0.9 % IV SOLN: INTRAVENOUS | Status: DC

## 2023-09-27 MED ORDER — PROPOFOL 500 MG/50ML IV EMUL
INTRAVENOUS | Status: DC | PRN
  Administered 2023-09-27: 140 ug/kg/min via INTRAVENOUS

## 2023-09-27 NOTE — Op Note (Signed)
 [facility] Gastroenterology Patient Name: Kristie Hoover Procedure Date: 09/27/2023 11:51 AM MRN: [id number] Account #: [id number] Date of Birth: [birth year] Admit Type: Outpatient Age: 73 Room: [facility] ENDO ROOM 3 Gender: Female Note Status: Finalized Instrument Name: Peds Colonoscope [id number] Procedure:             Colonoscopy Indications:           Abnormal PET scan of the GI tract Providers:             Ole Schick MD, MD Referring MD:          Maryl Sayres (Referring MD) Medicines:             Propofol  per Anesthesia Complications:         No immediate complications. Procedure:             Pre-Anesthesia Assessment:                        - Prior to the procedure, a History and Physical was                         performed, and patient medications and allergies were                         reviewed. The patient is competent. The risks and                         benefits of the procedure and the sedation options and                         risks were discussed with the patient. All questions                         were answered and informed consent was obtained.                         Patient identification and proposed procedure were                         verified by the physician, the nurse, the                         anesthesiologist, the anesthetist and the technician                         in the endoscopy suite. Mental Status Examination:                         alert and oriented. Airway Examination: normal                         oropharyngeal airway and neck mobility. Respiratory                         Examination: clear to auscultation. CV Examination:                         normal. Prophylactic Antibiotics: The patient does not  require prophylactic antibiotics. Prior                         Anticoagulants: The patient has taken no anticoagulant                         or antiplatelet agents. ASA Grade  Assessment: III - A                         patient with severe systemic disease. After reviewing                         the risks and benefits, the patient was deemed in                         satisfactory condition to undergo the procedure. The                         anesthesia plan was to use monitored anesthesia care                         (MAC). Immediately prior to administration of                         medications, the patient was re-assessed for adequacy                         to receive sedatives. The heart rate, respiratory                         rate, oxygen saturations, blood pressure, adequacy of                         pulmonary ventilation, and response to care were                         monitored throughout the procedure. The physical                         status of the patient was re-assessed after the                         procedure.                        After obtaining informed consent, the colonoscope was                         passed under direct vision. Throughout the procedure,                         the patient's blood pressure, pulse, and oxygen                         saturations were monitored continuously. The                         Colonoscope was introduced through the anus and  advanced to the the cecum, identified by appendiceal                         orifice and ileocecal valve. The colonoscopy was                         somewhat difficult due to a tortuous colon. The                         patient tolerated the procedure well. The quality of                         the bowel preparation was fair. The ileocecal valve,                         appendiceal orifice, and rectum were photographed. Findings:      The perianal and digital rectal examinations were normal.      There was a medium-sized lipoma, in the ascending colon.      A few small-mouthed diverticula were found in the transverse colon.      Many  large-mouthed and small-mouthed diverticula were found in the       sigmoid colon and descending colon.      Internal hemorrhoids were found during retroflexion. The hemorrhoids       were Grade I (internal hemorrhoids that do not prolapse).      The exam was otherwise without abnormality on direct and retroflexion       views. Impression:            - Preparation of the colon was fair.                        - Medium-sized lipoma in the ascending colon.                        - Diverticulosis in the transverse colon.                        - Diverticulosis in the sigmoid colon and in the                         descending colon.                        - Internal hemorrhoids.                        - The examination was otherwise normal on direct and                         retroflexion views.                        - No specimens collected. Recommendation:        - Discharge patient to home.                        - Resume previous diet.                        - Continue present medications.                        -  Repeat colonoscopy is not recommended due to current                         age (53 years or older) for screening purposes.                        - Return to referring physician as previously                         scheduled. Procedure Code(s):     --- Professional ---                        [phone], Colonoscopy, flexible; diagnostic, including                         collection of specimen(s) by brushing or washing, when                         performed (separate procedure) Diagnosis Code(s):     --- Professional ---                        K64.0, First degree hemorrhoids                        D17.5, Benign lipomatous neoplasm of intra-abdominal                         organs                        K57.30, Diverticulosis of large intestine without                         perforation or abscess without bleeding                        R93.3, Abnormal findings on  diagnostic imaging of                         other parts of digestive tract CPT copyright 2022 [employer]. All rights reserved. The codes documented in this report are preliminary and upon coder review may  be revised to meet current compliance requirements. Ole Schick MD, MD 09/27/2023 12:39:55 PM Number of Addenda: 0 Note Initiated On: 09/27/2023 11:51 AM Scope Withdrawal Time: 0 hours 7 minutes 18 seconds  Total Procedure Duration: 0 hours 14 minutes 49 seconds  Estimated Blood Loss:  Estimated blood loss: none.      [facility]

## 2023-09-27 NOTE — Transfer of Care (Signed)
 Immediate Anesthesia Transfer of Care Note  Patient: Kristie Hoover  Procedure(s) Performed: COLONOSCOPY  Patient Location: PACU  Anesthesia Type:General  Level of Consciousness: awake, alert , and oriented  Airway & Oxygen Therapy: Patient Spontanous Breathing  Post-op Assessment: Report given to RN and Post -op Vital signs reviewed and stable  Post vital signs: Reviewed and stable  Last Vitals:  Vitals Value Taken Time  BP    Temp    Pulse 71 09/27/23 12:30  Resp 15 09/27/23 12:30  SpO2 94 % 09/27/23 12:30  Vitals shown include unfiled device data.  Last Pain:  Vitals:   09/27/23 1141  TempSrc: Temporal  PainSc: 0-No pain         Complications: No notable events documented.

## 2023-09-27 NOTE — Anesthesia Preprocedure Evaluation (Signed)
 Anesthesia Evaluation  Patient identified by MRN, date of birth, ID band Patient awake and Patient confused    Reviewed: Allergy & Precautions, NPO status , Patient's Chart, lab work & pertinent test results  Airway Mallampati: III  TM Distance: >3 FB Neck ROM: full    Dental  (+) Upper Dentures, Poor Dentition   Pulmonary neg pulmonary ROS, asthma    Pulmonary exam normal breath sounds clear to auscultation       Cardiovascular Exercise Tolerance: Poor hypertension, Pt. on medications negative cardio ROS Normal cardiovascular exam Rhythm:Regular Rate:Normal     Neuro/Psych       Dementia negative neurological ROS  negative psych ROS   GI/Hepatic negative GI ROS, Neg liver ROS, hiatal hernia,GERD  Medicated,,  Endo/Other  negative endocrine ROSdiabetes, Type 2    Renal/GU negative Renal ROS  negative genitourinary   Musculoskeletal   Abdominal Normal abdominal exam  (+)   Peds  Hematology negative hematology ROS (+) Blood dyscrasia, anemia   Anesthesia Other Findings Past Medical History: No date: Anemia No date: Arthritis No date: Asthma     Comment:  WELL CONTROLLED No date: Carpal tunnel syndrome, left No date: DDD (degenerative disc disease), lumbar No date: Diabetes mellitus without complication (HCC) No date: GERD (gastroesophageal reflux disease) No date: History of hiatal hernia No date: Hypercholesterolemia No date: Hypertension     Comment:  NO MEDS No date: Kidney infection No date: Pneumonia     Comment:  @ 16 months old No date: Wears dentures     Comment:  full upper, partial lower  Past Surgical History: No date: ABDOMINAL HYSTERECTOMY No date: APPENDECTOMY No date: BACK SURGERY No date: CARPAL TUNNEL RELEASE; Right 08/15/2021: CATARACT EXTRACTION W/PHACO; Right     Comment:  Procedure: CATARACT EXTRACTION PHACO AND INTRAOCULAR               LENS PLACEMENT (IOC) RIGHT DIABETIC;   Surgeon: Jaye Fallow, MD;  Location: [facility];  Service:               Ophthalmology;  Laterality: Right;  6.40 0:49.8 08/29/2021: CATARACT EXTRACTION W/PHACO; Left     Comment:  Procedure: CATARACT EXTRACTION PHACO AND INTRAOCULAR               LENS PLACEMENT (IOC) LEFT DIABETIC 8.21 00:52.9;                Surgeon: Jaye Fallow, MD;  Location: [facility];  Service: Ophthalmology;  Laterality: Left;                Diabetic No date: COLONOSCOPY 08/19/2017: COLONOSCOPY WITH PROPOFOL ; N/A     Comment:  Procedure: COLONOSCOPY WITH PROPOFOL ;  Surgeon: Viktoria Lamar DASEN, MD;  Location: [facility] ENDOSCOPY;  Service:               Endoscopy;  Laterality: N/A; 03/31/2020: CYSTOCELE REPAIR; N/A     Comment:  Procedure: ANTERIOR REPAIR (CYSTOCELE);  Surgeon:               Arloa Lamar SQUIBB, MD;  Location: [facility];  Service:               Gynecology;  Laterality: N/A; No date: ESOPHAGOGASTRODUODENOSCOPY 08/19/2017: ESOPHAGOGASTRODUODENOSCOPY (EGD) WITH  PROPOFOL ; N/A     Comment:  Procedure: ESOPHAGOGASTRODUODENOSCOPY (EGD) WITH               PROPOFOL ;  Surgeon: Viktoria Lamar DASEN, MD;  Location:               [facility] ENDOSCOPY;  Service: Endoscopy;  Laterality: N/A; No date: EYE SURGERY 1998: LAMINECTOMY     Comment:  NO METAL 03/31/2020: RECTOCELE REPAIR; N/A     Comment:  Procedure: POSTERIOR REPAIR (RECTOCELE);  Surgeon:               Arloa Lamar SQUIBB, MD;  Location: [facility];  Service:               Gynecology;  Laterality: N/A; 08/11/2020: REVERSE SHOULDER ARTHROPLASTY; Left     Comment:  Procedure: REVERSE SHOULDER ARTHROPLASTY;  Surgeon:               Edie Norleen PARAS, MD;  Location: [facility] ORS;  Service:               Orthopedics;  Laterality: Left; No date: REVERSE TOTAL SHOULDER ARTHROPLASTY; Left 1993: WRIST FUSION; Right     Comment:  METAL PLATE AND SCREWS HAVE BEEN REMOVED  BMI    Body Mass Index: 23.29 kg/m       Reproductive/Obstetrics negative OB ROS                              Anesthesia Physical Anesthesia Plan  ASA: 3  Anesthesia Plan: General   Post-op Pain Management:    Induction: Intravenous  PONV Risk Score and Plan: Propofol  infusion and TIVA  Airway Management Planned: Natural Airway and Nasal Cannula  Additional Equipment:   Intra-op Plan:   Post-operative Plan:   Informed Consent: I have reviewed the patients History and Physical, chart, labs and discussed the procedure including the risks, benefits and alternatives for the proposed anesthesia with the patient or authorized representative who has indicated his/her understanding and acceptance.     Dental Advisory Given  Plan Discussed with: CRNA  Anesthesia Plan Comments:         Anesthesia Quick Evaluation

## 2023-09-27 NOTE — Interval H&P Note (Signed)
 History and Physical Interval Note:  09/27/2023 12:02 PM  Kristie Hoover  has presented today for surgery, with the diagnosis of Abnormal positron emission tomography (PET) scan (R94.8) Unintentional weight loss (R63.4) Pelvic mass in female (R19.00) History of diverticulitis (Z87.19).  The various methods of treatment have been discussed with the patient and family. After consideration of risks, benefits and other options for treatment, the patient has consented to  Procedure(s): COLONOSCOPY (N/A) as a surgical intervention.  The patient's history has been reviewed, patient examined, no change in status, stable for surgery.  I have reviewed the patient's chart and labs.  Questions were answered to the patient's satisfaction.     Ole ONEIDA Schick  Ok to proceed with colonoscopy

## 2023-09-27 NOTE — H&P (Signed)
 Outpatient short stay form Pre-procedure 09/27/2023  Kristie Hoover Schick, MD  Primary Physician: [facility]  Reason for visit:  Abnormal imaging  History of present illness:    73 y/o lady with history of dementia, hypertension, HLD, and DM II here for colonoscopy for abnormal imaging on PET scan which shows mass in pelvic region or bladder. Negative cystoscopy and pelvic exam. Last colonoscopy in 2019 was unremarkable. No blood thinners. History of hysterectomy and appendectomy.    Current Facility-Administered Medications:    0.9 %  sodium chloride  infusion, , Intravenous, Continuous, Kennieth Plotts, Kristie ONEIDA, MD, Last Rate: 20 mL/hr at 09/27/23 1155, Continued from Pre-op at 09/27/23 1155  Medications Prior to Admission  Medication Sig Dispense Refill Last Dose/Taking   acetaminophen  (TYLENOL ) 500 MG tablet Take 500 mg by mouth every 6 (six) hours as needed for moderate pain (pain score 4-6).   Taking As Needed   amLODipine  (NORVASC ) 5 MG tablet Take 1 tablet (5 mg total) by mouth daily. 30 tablet 0 09/26/2023   divalproex (DEPAKOTE) 125 MG DR tablet Take by mouth.   09/26/2023   ibuprofen (ADVIL) 200 MG tablet Take 200 mg by mouth every 6 (six) hours as needed.   Taking As Needed   latanoprost  (XALATAN ) 0.005 % ophthalmic solution Place 1 drop into both eyes at bedtime.   09/26/2023   melatonin 5 MG TABS Take 5 mg by mouth in the morning and at bedtime.   09/26/2023   mirabegron  ER (MYRBETRIQ ) 25 MG TB24 tablet Take 1 tablet (25 mg total) by mouth daily. 30 tablet 0 09/26/2023   mirtazapine  (REMERON ) 15 MG tablet Take 1 tablet (15 mg total) by mouth at bedtime. 30 tablet 0 09/26/2023   Multiple Vitamins-Minerals (MULTIVITAMINS THER. W/MINERALS) TABS tablet Take 1 tablet by mouth daily.   Taking   potassium chloride  SA (K-DUR,KLOR-CON ) 20 MEQ tablet Take 20 mEq by mouth 2 (two) times daily.   09/26/2023   QUEtiapine (SEROQUEL) 50 MG tablet Take 50 mg by mouth at bedtime.   Taking    traMADol (ULTRAM) 50 MG tablet Take 50 mg by mouth every 8 (eight) hours as needed for moderate pain (pain score 4-6).   Taking As Needed   atorvastatin (LIPITOR) 10 MG tablet Take 10 mg by mouth daily.      feeding supplement (ENSURE ENLIVE / ENSURE PLUS) LIQD Take 237 mLs by mouth 3 (three) times daily between meals. 237 mL 12    Ferrous Sulfate (IRON PO) Take by mouth.      montelukast  (SINGULAIR ) 10 MG tablet Take 10 mg by mouth every morning.      Multiple Vitamin (MULTIVITAMIN WITH MINERALS) TABS tablet Take 1 tablet by mouth daily.      pantoprazole  (PROTONIX ) 40 MG tablet Take 40 mg by mouth every morning.        Allergies  Allergen Reactions   Oxycodone -Acetaminophen      Hallucinations    Prednisone Hives   Tramadol Other (See Comments)    Hallucinations   Chlorhexidine  Gluconate Itching    Chg soap   Erythromycin Rash     Past Medical History:  Diagnosis Date   Anemia    Arthritis    Asthma    WELL CONTROLLED   Carpal tunnel syndrome, left    DDD (degenerative disc disease), lumbar    Dementia (HCC)    Diabetes mellitus without complication (HCC)    GERD (gastroesophageal reflux disease)    History of hiatal hernia  Hypercholesterolemia    Hypertension    NO MEDS   Kidney infection    Pneumonia    @ 42 months old   Wears dentures    full upper, partial lower    Review of systems:  Otherwise negative.    Physical Exam  Gen: Alert, oriented. Appears stated age.  HEENT: PERRLA. Lungs: No respiratory distress CV: RRR Abd: soft, benign, no masses Ext: No edema    Planned procedures: Proceed with colonoscopy. The patient understands the nature of the planned procedure, indications, risks, alternatives and potential complications including but not limited to bleeding, infection, perforation, damage to internal organs and possible oversedation/side effects from anesthesia. The patient agrees and gives consent to proceed.  Please refer to procedure notes  for findings, recommendations and patient disposition/instructions.     Kristie Hoover Schick, MD [facility] Gastroenterology

## 2023-09-27 NOTE — Anesthesia Postprocedure Evaluation (Signed)
 Anesthesia Post Note  Patient: Kristie Hoover  Procedure(s) Performed: COLONOSCOPY  Patient location during evaluation: PACU Anesthesia Type: General Level of consciousness: awake Pain management: pain level controlled Vital Signs Assessment: post-procedure vital signs reviewed and stable Respiratory status: spontaneous breathing Cardiovascular status: stable Anesthetic complications: no   No notable events documented.   Last Vitals:  Vitals:   09/27/23 1250 09/27/23 1253  BP:  (!) 152/65  Pulse:  60  Resp: 18 15  Temp:    SpO2:      Last Pain:  Vitals:   09/27/23 1141  TempSrc: Temporal  PainSc: 0-No pain                 VAN STAVEREN,Lalitha Ilyas

## 2023-10-03 ENCOUNTER — Other Ambulatory Visit: Payer: Self-pay

## 2023-10-03 DIAGNOSIS — R1900 Intra-abdominal and pelvic swelling, mass and lump, unspecified site: Secondary | ICD-10-CM

## 2023-10-03 NOTE — Progress Notes (Signed)
 Spoke with Dr. Mancil regarding recent negative colonoscopy. At his request, request for biopsy sent. If [facility] IR is unable to biopsy this pelvic mass then he recommends to be biopsied at [facility]. Order placed for IR guided biopsy.

## 2023-10-04 ENCOUNTER — Telehealth: Payer: Self-pay

## 2023-10-04 NOTE — Telephone Encounter (Signed)
 Spoke with Kristie Hoover, guardianship niece, regarding negative cystoscopy, vaginal exam and colonoscopy. Mass previously reviewed by IR in 06/2023 and the area could not be biopsied. Sent for review again and IR responded that the mass cannot be biopsied percutaneously. Offered Kristie Hoover a referral to [facility] to see if they can biopsy it, per recommendation of Dr. Mancil. She states it is not bothering her and she prefers not to pursue anything further. I did recommended a follow up with gyn onc to get any final recommendations. Appointment arranged for 8/13. Sooner appointment offered and Kristie Hoover reported she just had a stroke and is recovering. She prefers the later appointment so hopefully she can come as well.

## 2023-10-08 NOTE — Progress Notes (Signed)
 Luverne Aran, MD sent to [name] We cannot biopsy this percutaneously. Recommend considering PET scan or repeat CT.  GY

## 2023-10-30 ENCOUNTER — Inpatient Hospital Stay: Attending: Obstetrics and Gynecology | Admitting: Obstetrics and Gynecology

## 2023-10-30 VITALS — BP 141/76 | HR 61 | Temp 98.6°F | Resp 20 | Wt 151.1 lb

## 2023-10-30 DIAGNOSIS — Z90710 Acquired absence of both cervix and uterus: Secondary | ICD-10-CM | POA: Diagnosis not present

## 2023-10-30 DIAGNOSIS — E7800 Pure hypercholesterolemia, unspecified: Secondary | ICD-10-CM | POA: Diagnosis not present

## 2023-10-30 DIAGNOSIS — K648 Other hemorrhoids: Secondary | ICD-10-CM | POA: Insufficient documentation

## 2023-10-30 DIAGNOSIS — Z8616 Personal history of COVID-19: Secondary | ICD-10-CM | POA: Insufficient documentation

## 2023-10-30 DIAGNOSIS — E785 Hyperlipidemia, unspecified: Secondary | ICD-10-CM | POA: Insufficient documentation

## 2023-10-30 DIAGNOSIS — J45909 Unspecified asthma, uncomplicated: Secondary | ICD-10-CM | POA: Diagnosis not present

## 2023-10-30 DIAGNOSIS — Z885 Allergy status to narcotic agent status: Secondary | ICD-10-CM | POA: Insufficient documentation

## 2023-10-30 DIAGNOSIS — K5730 Diverticulosis of large intestine without perforation or abscess without bleeding: Secondary | ICD-10-CM | POA: Diagnosis not present

## 2023-10-30 DIAGNOSIS — Z9049 Acquired absence of other specified parts of digestive tract: Secondary | ICD-10-CM | POA: Diagnosis not present

## 2023-10-30 DIAGNOSIS — Z79899 Other long term (current) drug therapy: Secondary | ICD-10-CM | POA: Diagnosis not present

## 2023-10-30 DIAGNOSIS — Z888 Allergy status to other drugs, medicaments and biological substances status: Secondary | ICD-10-CM | POA: Diagnosis not present

## 2023-10-30 DIAGNOSIS — I10 Essential (primary) hypertension: Secondary | ICD-10-CM | POA: Diagnosis not present

## 2023-10-30 DIAGNOSIS — R1900 Intra-abdominal and pelvic swelling, mass and lump, unspecified site: Secondary | ICD-10-CM | POA: Insufficient documentation

## 2023-10-30 DIAGNOSIS — Z881 Allergy status to other antibiotic agents status: Secondary | ICD-10-CM | POA: Insufficient documentation

## 2023-10-30 DIAGNOSIS — F0392 Unspecified dementia, unspecified severity, with psychotic disturbance: Secondary | ICD-10-CM | POA: Insufficient documentation

## 2023-10-30 DIAGNOSIS — Z803 Family history of malignant neoplasm of breast: Secondary | ICD-10-CM | POA: Insufficient documentation

## 2023-10-30 DIAGNOSIS — E119 Type 2 diabetes mellitus without complications: Secondary | ICD-10-CM | POA: Diagnosis not present

## 2023-10-30 NOTE — Progress Notes (Signed)
 Gynecologic Oncology Consult Visit   Referring Provider: Dr. Penne  Chief Complaint: FDG avid pelvic Mass Subjective:  SHAWANA KNOCH is a 73 y.o. P0G0 female who is seen in consultation from Dr. Penne for possible pelvic versus bladder mass.  Patient has medical history significant for diabetes, hypertension, hyperlipidemia, memory impairment and delusional disorder, who currently resides at skilled nursing facility.  She initially presented to ER in September 2024 after being found unresponsive at her home.  She was found to have a blood glucose level of 23 and creatinine of 6.56, potassium 2.5.  She had recurrent hypoglycemic episodes in the ER.  Renal ultrasound found large bladder mass measuring 5 x 5.8 x 5.7 heterogeneous hypoechoic thought to originate in the posterior aspect of the urinary bladder.   Returns today with a care giver from her facility and her niece Charolette who has power of attorney is on the phone.  Interventional radiology at [facility] do not think IR biopsy is technically possible.  Offered referral to [facility] and they are here today for discussion.  They are really not interested in pursuing biopsy and treatment due to her overall condition with memory loss.  The mass was discovered 11 months ago and has not caused any symptoms. No new bleeding or other symptoms.   Gyn history She was referred to urology but rescheduled multiple times. Eventually she was seen by Dr. Penne cystoscopy on 04/16/2023 with majority of bladder being unremarkable. However, the trigone and bladder posterior lesion there was an infiltrative masslike aspect possibly arising from the outside of the bladder.  The mucosa itself was fairly unremarkable without any sort of papillary change.  Bilateral ureteral orifice were somewhat displaced but visible.  She underwent a pelvic exam that was somewhat limited did but no obvious vaginal masses markable.  04/30/2023-MR pelvis with without  contrast FINDINGS: Lower Urinary Tract: A bulky soft tissue mass is seen which involves the posterior wall of the urinary bladder. This measures 5.2 x 3.6 by 4.6 cm, and shows a central necrotic area with a small amount of she had female with a air. the anterior wall of the sigmoid colon superiorly. Bowel: The mass described above involves the anterior wall of the sigmoid colon. Severe sigmoid diverticulosis seen, without signs of diverticulitis. Vascular/Lymphatic: No pathologically enlarged lymph nodes or other significant abnormality. Reproductive: Prior hysterectomy. The mass described above also appears to involve the vaginal cuff posteriorly. Both ovaries are normal in appearance. No evidence of free fluid. Other: None.  Musculoskeletal: No suspicious bone lesions identified.   IMPRESSION: - 5.2 cm bulky soft tissue mass involving the posterior wall of the urinary bladder and vaginal cuff. This is highly suspicious for malignancy, and differential diagnosis includes vaginal or cervical carcinoma, and bladder carcinoma. - This mass also involves the inferior wall of the sigmoid colon. - No evidence of pelvic lymphadenopathy or other metastatic disease. - Severe sigmoid diverticulosis, without signs of diverticulitis.  She presented for consultation and possible biopsy and further workup.  She denies vaginal bleeding or spotting or discharge.  She had a hysterectomy for heavy bleeding many years ago.  Denies urinary incontinence or changes in bowel movements.  Says she has a history of diverticulitis.  She does have chronic memory changes and currently resides at [facility] which is memory care assisted living facility.     PET scan done 06/05/23 shows mass is FDG avid.     ABDOMEN/PELVIS:   The previously demonstrated pelvic mass involving the  vaginal cuff is intensely hypermetabolic (SUV max 12.7). The lesion is not well defined on the CT images, but contiguous with the posterior  bladder wall and possibly slightly smaller from previous MRI. No adnexal mass. There is no hypermetabolic activity within the liver, adrenal glands, spleen or pancreas. There are small inguinal lymph nodes bilaterally with low level metabolic activity (greatest SUV max 2.9 on the right). No hypermetabolic abdominal lymph nodes. No suspicious bowel or peritoneal activity.   Incidental CT findings: Mild aortoiliac atherosclerosis. Diverticular changes within the distal colon without definite surrounding acute inflammation.   SKELETON:   There is no hypermetabolic activity to suggest osseous metastatic disease.   Incidental CT findings: Multilevel spondylosis.   IMPRESSION: 1. The previously demonstrated pelvic mass involving the vaginal cuff is intensely hypermetabolic, concerning for malignancy. Based on previous negative cystoscopy, this may represent cervical/vaginal cuff malignancy. Sequela of previous sigmoid diverticulitis is possible, as queried on gynecology notes from 05/29/2023. Correlate with additional clinical workup. If not felt to represent malignancy clinically, consider follow-up CT pelvis with contrast for further evaluation. 2. No evidence of metastatic disease. 3. Small inguinal lymph nodes bilaterally with low level metabolic activity, likely reactive.  Last colonoscopy 08/2017- Dr Viktoria- diverticulosis, internal hemorrhoids but otherwise normal. Recommend repeating in 2029.  Last EGD 08/19/17 revealed mild schatzki ring, dilated. Normal otherwise.   Problem List: Patient Active Problem List   Diagnosis Date Noted   Malnutrition of moderate degree 11/29/2022   HTN (hypertension) 11/28/2022   Asthma 11/28/2022   Uncontrolled diabetes mellitus with hypoglycemic coma, without long-term current use of insulin  (HCC) 11/28/2022   AKI (acute kidney injury) (HCC) 11/28/2022   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   Dementia without behavioral disturbance (HCC)  11/28/2022   High anion gap metabolic acidosis 11/28/2022   Hypokalemia 11/28/2022   Hypocalcemia 11/28/2022   Anemia 11/28/2022   COVID-19 virus infection 11/28/2022   Hypercholesterolemia    Acute confusional state 10/31/2022   Auditory hallucinations 10/31/2022   Status post reverse total shoulder replacement, 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   Status post reverse arthroplasty of shoulder, 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   S/P anterior colporrhaphy 04/18/2020   Rectocele    Cystocele, midline 02/15/2020    Past Medical History: Past Medical History:  Diagnosis Date   Anemia    Arthritis    Asthma    WELL CONTROLLED   Carpal tunnel syndrome, left    DDD (degenerative disc disease), lumbar    Dementia (HCC)    Diabetes mellitus without complication (HCC)    GERD (gastroesophageal reflux disease)    History of hiatal hernia    Hypercholesterolemia    Hypertension    NO MEDS   Kidney infection    Pneumonia    @ 45 months old   Wears dentures    full upper, partial lower    Past Surgical History: Past Surgical History:  Procedure Laterality Date   ABDOMINAL HYSTERECTOMY     APPENDECTOMY     BACK SURGERY     CARPAL TUNNEL RELEASE Right    CATARACT EXTRACTION W/PHACO Right 08/15/2021   Procedure: CATARACT EXTRACTION PHACO AND INTRAOCULAR LENS PLACEMENT (IOC) RIGHT DIABETIC;  Surgeon: Jaye Fallow, MD;  Location: [facility];  Service: Ophthalmology;  Laterality: Right;  6.40 0:49.8   CATARACT EXTRACTION W/PHACO Left 08/29/2021   Procedure: CATARACT EXTRACTION PHACO AND INTRAOCULAR LENS PLACEMENT (IOC) LEFT DIABETIC 8.21 00:52.9;  Surgeon: Jaye Fallow, MD;  Location: [facility];  Service: Ophthalmology;  Laterality: Left;  Diabetic  COLONOSCOPY     COLONOSCOPY N/A 09/27/2023   Procedure: COLONOSCOPY;  Surgeon: Maryruth Ole DASEN, MD;  Location: [facility] END[facility];  Service: Endoscopy;  Laterality: N/A;   COLONOSCOPY WITH PROPOFOL  N/A 08/19/2017   Procedure: COLONOSCOPY WITH  PROPOFOL ;  Surgeon: Viktoria Lamar DASEN, MD;  Location: [facility];  Service: Endoscopy;  Laterality: N/A;   CYSTOCELE REPAIR N/A 03/31/2020   Procedure: ANTERIOR REPAIR (CYSTOCELE);  Surgeon: Arloa Lamar SQUIBB, MD;  Location: [facility] ORS;  Service: Gynecology;  Laterality: N/A;   ESOPHAGOGASTRODUODENOSCOPY     ESOPHAGOGASTRODUODENOSCOPY (EGD) WITH PROPOFOL  N/A 08/19/2017   Procedure: ESOPHAGOGASTRODUODENOSCOPY (EGD) WITH PROPOFOL ;  Surgeon: Viktoria Lamar DASEN, MD;  Location: [facility] ENDOSCOPY;  Service: Endoscopy;  Laterality: N/A;   EYE SURGERY     LAMINECTOMY  1998   NO METAL   RECTOCELE REPAIR N/A 03/31/2020   Procedure: POSTERIOR REPAIR (RECTOCELE);  Surgeon: Arloa Lamar SQUIBB, MD;  Location: [facility];  Service: Gynecology;  Laterality: N/A;   REVERSE SHOULDER ARTHROPLASTY Left 08/11/2020   Procedure: REVERSE SHOULDER ARTHROPLASTY;  Surgeon: Edie Norleen PARAS, MD;  Location: [facility] OR[facility];  Service: Orthopedics;  Laterality: Left;   REVERSE TOTAL SHOULDER ARTHROPLASTY Left    WRIST FUSION Right 1993   METAL PLATE AND SCREWS HAVE BEEN REMOVED    Past Gynecologic History:  Post menopausal Hysterectomy in 1983 for heavy bleeding Sexually active: denies  OB History:  OB History  Gravida Para Term Preterm AB Living  0 0 0 0 0 0  SAB IAB Ectopic Multiple Live Births  0 0 0 0 0    Family History: Family History  Problem Relation Age of Onset   Breast cancer Other     Social History: Social History   Socioeconomic History   Marital status: Single    Spouse name: Not on file   Number of children: Not on file   Years of education: Not on file   Highest education level: Not on file  Occupational History   Occupation: warehouse (socks) industry  Tobacco Use   Smoking status: Never   Smokeless tobacco: Never  Vaping Use   Vaping status: Never Used  Substance and Sexual Activity   Alcohol use: Not Currently   Drug use: Never   Sexual activity: Not Currently  Other Topics Concern    Not on file  Social History Narrative   Patient lives alone in an apartment building.   Feels safe in her home.    Social Drivers of Corporate investment banker Strain: Low Risk  (11/22/2022)   Received from [facility]   Overall Financial Resource Strain (CARDIA)    Difficulty of Paying Living Expenses: Not hard at all  Food Insecurity: Patient Unable To Answer (11/28/2022)   Hunger Vital Sign    Worried About Running Out of Food in the Last Year: Patient unable to answer    Ran Out of Food in the Last Year: Patient unable to answer  Transportation Needs: Patient Unable To Answer (11/28/2022)   PRAPARE - Transportation    Lack of Transportation (Medical): Patient unable to answer    Lack of Transportation (Non-Medical): Patient unable to answer  Physical Activity: Not on file  Stress: Not on file  Social Connections: Not on file  Intimate Partner Violence: Patient Unable To Answer (11/28/2022)   Humiliation, Afraid, Rape, and Kick questionnaire    Fear of Current or Ex-Partner: Patient unable to answer    Emotionally Abused: Patient unable to answer    Physically  Abused: Patient unable to answer    Sexually Abused: Patient unable to answer    Allergies: Allergies  Allergen Reactions   Oxycodone -Acetaminophen      Hallucinations    Prednisone Hives   Tramadol Other (See Comments)    Hallucinations   Chlorhexidine  Gluconate Itching    Chg soap   Erythromycin Rash    Current Medications: Current Outpatient Medications  Medication Sig Dispense Refill   acetaminophen  (TYLENOL ) 500 MG tablet Take 500 mg by mouth every 6 (six) hours as needed for moderate pain (pain score 4-6).     amLODipine  (NORVASC ) 5 MG tablet Take 1 tablet (5 mg total) by mouth daily. 30 tablet 0   atorvastatin (LIPITOR) 10 MG tablet Take 10 mg by mouth daily.     divalproex (DEPAKOTE) 125 MG DR tablet Take by mouth.     feeding supplement (ENSURE ENLIVE / ENSURE PLUS) LIQD Take 237 mLs by  mouth 3 (three) times daily between meals. 237 mL 12   Ferrous Sulfate (IRON PO) Take by mouth.     ibuprofen (ADVIL) 200 MG tablet Take 200 mg by mouth every 6 (six) hours as needed.     latanoprost  (XALATAN ) 0.005 % ophthalmic solution Place 1 drop into both eyes at bedtime.     melatonin 5 MG TABS Take 5 mg by mouth in the morning and at bedtime.     mirabegron  ER (MYRBETRIQ ) 25 MG TB24 tablet Take 1 tablet (25 mg total) by mouth daily. 30 tablet 0   mirtazapine  (REMERON ) 15 MG tablet Take 1 tablet (15 mg total) by mouth at bedtime. 30 tablet 0   montelukast  (SINGULAIR ) 10 MG tablet Take 10 mg by mouth every morning.     Multiple Vitamin (MULTIVITAMIN WITH MINERALS) TABS tablet Take 1 tablet by mouth daily.     Multiple Vitamins-Minerals (MULTIVITAMINS THER. W/MINERALS) TABS tablet Take 1 tablet by mouth daily.     pantoprazole  (PROTONIX ) 40 MG tablet Take 40 mg by mouth every morning.     potassium chloride  SA (K-DUR,KLOR-CON ) 20 MEQ tablet Take 20 mEq by mouth 2 (two) times daily.     QUEtiapine (SEROQUEL) 50 MG tablet Take 50 mg by mouth at bedtime.     traMADol (ULTRAM) 50 MG tablet Take 50 mg by mouth every 8 (eight) hours as needed for moderate pain (pain score 4-6).     No current facility-administered medications for this visit.    Review of Systems General: negative for fevers, changes in weight or night sweats Skin: negative for changes in moles or sores or rash Eyes: negative for changes in vision HEENT: negative for change in hearing, tinnitus, voice changes Pulmonary: negative for dyspnea, orthopnea, productive cough, wheezing Cardiac: negative for palpitations, pain Gastrointestinal: negative for nausea, vomiting, constipation, diarrhea, hematemesis, hematochezia Genitourinary/Sexual: negative for dysuria, retention, hematuria, incontinence Ob/Gyn:  negative for abnormal bleeding, or pain Musculoskeletal: some back pain Hematology: negative for easy bruising, abnormal  bleeding Neurologic/Psych: negative for headaches, seizures, paralysis, weakness, numbness  Objective:  Physical Examination:  BP (!) 141/76   Pulse 61   Temp 98.6 F (37 C)   Resp 20   Wt 151 lb 1.6 oz (68.5 kg)   SpO2 100%   BMI 24.40 kg/m     ECOG Performance Status: 2 - Symptomatic, <50% confined to bed  Per last visit. General appearance: cooperative and appears stated age HEENT: mucus membranes moist no mucosal lesions posterior pharynx benign PERRL EOMI sclera clear Neck: normal Lymph node survey:  non-palpable, axillary, inguinal, supraclavicular Cardiovascular: without murmurs, rubs or gallops Respiratory: clear to auscultation Abdomen: no palpable masses, no hernias, well healed incision, soft, nontender, nondistended, bowel sounds present, and without hepatosplenomegaly Back: inspection of back is normal Extremities: no lower extremity edema Skin exam - normal coloration and turgor, no rashes, no suspicious skin lesions noted. Neurological exam reveals alert, oriented, normal speech, no focal findings or movement disorder noted.  Per last visit. Pelvic: Exam chaperoned by CMA Pelvic exam not done today.. EGBUS: no lesions Vagina: normal Bimanual/Rectovaginal: 6 cm mass in cul de sac, not invading vagina. The mass is fairly smooth and not nodular.  Lab Review Labs on site today: Lab Results  Component Value Date   WBC 8.7 07/17/2023   HGB 13.0 07/17/2023   HCT 40.7 07/17/2023   MCV 93.3 07/17/2023   PLT 234 07/17/2023     Chemistry      Component Value Date/Time   NA 140 07/17/2023 1301   K 3.8 07/17/2023 1301   CL 103 07/17/2023 1301   CO2 27 07/17/2023 1301   BUN 13 07/17/2023 1301   CREATININE 0.90 07/17/2023 1301      Component Value Date/Time   CALCIUM  9.3 07/17/2023 1301   ALKPHOS 95 07/17/2023 1301   AST 21 07/17/2023 1301   ALT 16 07/17/2023 1301   BILITOT 0.5 07/17/2023 1301      3/25 CA125 = 8 CEA = 2.5 CA19-9 =  26   Radiologic Imaging: MRI 04/30/23 FINDINGS: Lower Urinary Tract: A bulky soft tissue mass is seen which involves the posterior wall of the urinary bladder. This measures 5.2 x 3.6 by 4.6 cm, and shows a central necrotic area with a small amount of air. the anterior wall of the sigmoid colon superiorly.   Bowel: The mass described above involves the anterior wall of the sigmoid colon. Severe sigmoid diverticulosis seen, without signs of diverticulitis.   Vascular/Lymphatic: No pathologically enlarged lymph nodes or other significant abnormality.   Reproductive: Prior hysterectomy. The mass described above also appears to involve the vaginal cuff posteriorly. Both ovaries are normal in appearance. No evidence of free fluid.   Other: None.   Musculoskeletal: No suspicious bone lesions identified.   IMPRESSION: 5.2 cm bulky soft tissue mass involving the posterior wall of the urinary bladder and vaginal cuff. This is highly suspicious for malignancy, and differential diagnosis includes vaginal or cervical carcinoma, and bladder carcinoma.   This mass also involves the inferior wall of the sigmoid colon.   No evidence of pelvic lymphadenopathy or other metastatic disease.   Severe sigmoid diverticulosis, without signs of diverticulitis.      Assessment:  ANNY SAYLER is a 73 y.o. female diagnosed with 6 cm pelvic mass in cul de sac adjacent to bladder and vagina. Cystoscopy negative and no PMB. See details of work up to date above. History of severe diverticulosis on imaging and bouts of diverticulitis.  Prior hysterectomy and normal ovaries seen on MRI.  Do not see a cervix on speculum exam, so do not think the cervix was left in place, but this is a possibility. This mass could be malignant, but it could be the sequelae of a prior diverticular abscess.  Also could be arising in endometriosis post hysterectomy. Normal tumor markers 3/25.    PET scan 3/25 showed that the  pelvic mass is FDG avid and likely cancer.  No metastatic disease.  Colonoscopy 09/27/23 showed some diverticula, but no cancer or diverticulitis.  Returns today with a care giver from her facility and her niece Charolette who has power of attorney is on the phone.  Interventional radiology at [facility] do not think IR biopsy of the mass for diagnosis is technically possible.  Offered referral to [facility] Radiology and they are here today for discussion.  They are really not interested in pursuing biopsy and treatment due to her overall condition with memory loss and lack of symptoms.  The mass was discovered 11 months ago and has not caused any symptoms. No new bleeding or other symptoms.   Medical co-morbidities complicating care: HTN, Diabetes, AKI, mental confusion.  Plan:   Problem List Items Addressed This Visit       Other   Pelvic mass - Primary   Patient will return to the facility where she lives and we filled out the form with our recommendations.   We can see her back if she develops symptoms in the future and desires further work up or treatment.   Prentice Agent, MD   CC:  [facility] [address] [place],  [place] [address] [phone]

## 2024-01-21 ENCOUNTER — Emergency Department

## 2024-01-21 ENCOUNTER — Other Ambulatory Visit: Payer: Self-pay

## 2024-01-21 ENCOUNTER — Emergency Department
Admission: EM | Admit: 2024-01-21 | Discharge: 2024-01-21 | Disposition: A | Discharge status: Home / Self Care | Attending: Emergency Medicine | Admitting: Emergency Medicine

## 2024-01-21 DIAGNOSIS — S0101XA Laceration without foreign body of scalp, initial encounter: Secondary | ICD-10-CM | POA: Diagnosis not present

## 2024-01-21 DIAGNOSIS — F0390 Unspecified dementia without behavioral disturbance: Secondary | ICD-10-CM | POA: Diagnosis not present

## 2024-01-21 DIAGNOSIS — E119 Type 2 diabetes mellitus without complications: Secondary | ICD-10-CM | POA: Diagnosis not present

## 2024-01-21 DIAGNOSIS — W01198A Fall on same level from slipping, tripping and stumbling with subsequent striking against other object, initial encounter: Secondary | ICD-10-CM | POA: Insufficient documentation

## 2024-01-21 DIAGNOSIS — I10 Essential (primary) hypertension: Secondary | ICD-10-CM | POA: Insufficient documentation

## 2024-01-21 DIAGNOSIS — S0990XA Unspecified injury of head, initial encounter: Secondary | ICD-10-CM | POA: Diagnosis present

## 2024-01-21 DIAGNOSIS — Z23 Encounter for immunization: Secondary | ICD-10-CM | POA: Diagnosis not present

## 2024-01-21 DIAGNOSIS — W19XXXA Unspecified fall, initial encounter: Secondary | ICD-10-CM

## 2024-01-21 MED ORDER — TETANUS-DIPHTH-ACELL PERTUSSIS 5-2-15.5 LF-MCG/0.5 IM SUSP
0.5000 mL | Freq: Once | INTRAMUSCULAR | Status: AC
  Administered 2024-01-21: 0.5 mL via INTRAMUSCULAR
  Filled 2024-01-21: qty 0.5

## 2024-01-21 MED ORDER — ACETAMINOPHEN 500 MG PO TABS
1000.0000 mg | ORAL_TABLET | Freq: Once | ORAL | Status: AC
Start: 2024-01-21 — End: 2024-01-21
  Administered 2024-01-21: 1000 mg via ORAL
  Filled 2024-01-21: qty 2
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
## 2024-01-21 NOTE — ED Notes (Addendum)
 MD Ernest at bedside stapling patients head laceration. One staple and dermabond used, bleeding controlled. Patient tolerated procedure well.
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
## 2024-01-21 NOTE — ED Notes (Signed)
 Patient transported to CT

## 2024-01-21 NOTE — ED Notes (Signed)
 Pt ambulated to restroom and back.

## 2024-01-21 NOTE — Discharge Instructions (Addendum)
 Patient had 1 staple placed and this will be need to be removed in 10 to 14 days.  Her CT scans are as below but overall reassuring.  Return to the ER for confusion, fevers or any other concern   IMPRESSION: 1. Scalp soft tissue injury. Underlying calvarium intact. 2. No acute intracranial abnormality. IMPRESSION: 1. No acute traumatic injury identified in the cervical spine. 2. 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

## 2024-01-21 NOTE — ED Notes (Signed)
 Patient niece picked up patient. Patient to be brought back to [facility].

## 2024-01-21 NOTE — ED Notes (Signed)
 Patient ambulated from EMS stretcher to bed without assistance.
